# Patient Record
Sex: Female | Born: 1975 | Race: White | Hispanic: No | Smoking: Never smoker
Health system: Southern US, Community
[De-identification: ages and names within clinical notes are randomized; demographics above are authoritative.]

## PROBLEM LIST (undated history)

## (undated) DIAGNOSIS — G43109 Migraine with aura, not intractable, without status migrainosus: Secondary | ICD-10-CM

## (undated) DIAGNOSIS — R519 Headache, unspecified: Secondary | ICD-10-CM

## (undated) DIAGNOSIS — F329 Major depressive disorder, single episode, unspecified: Secondary | ICD-10-CM

## (undated) DIAGNOSIS — R51 Headache: Secondary | ICD-10-CM

## (undated) DIAGNOSIS — F32A Depression, unspecified: Secondary | ICD-10-CM

## (undated) DIAGNOSIS — I10 Essential (primary) hypertension: Secondary | ICD-10-CM

## (undated) HISTORY — DX: Headache, unspecified: R51.9

## (undated) HISTORY — DX: Migraine with aura, not intractable, without status migrainosus: G43.109

## (undated) HISTORY — DX: Essential (primary) hypertension: I10

## (undated) HISTORY — DX: Depression, unspecified: F32.A

## (undated) HISTORY — DX: Major depressive disorder, single episode, unspecified: F32.9

## (undated) HISTORY — DX: Headache: R51

---
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
## 2009-07-06 HISTORY — PX: TONSILLECTOMY AND ADENOIDECTOMY: SHX28

## 2012-11-21 DIAGNOSIS — F419 Anxiety disorder, unspecified: Secondary | ICD-10-CM | POA: Insufficient documentation

## 2014-05-02 ENCOUNTER — Encounter (HOSPITAL_COMMUNITY): Payer: Self-pay | Admitting: Emergency Medicine

## 2014-05-02 ENCOUNTER — Emergency Department (HOSPITAL_COMMUNITY)
Admission: EM | Admit: 2014-05-02 | Discharge: 2014-05-02 | Disposition: A | Discharge status: Home / Self Care | Payer: 59 | Source: Home / Self Care | Attending: Family Medicine | Admitting: Family Medicine

## 2014-05-02 DIAGNOSIS — J069 Acute upper respiratory infection, unspecified: Secondary | ICD-10-CM

## 2014-05-02 MED ORDER — MUCINEX DM 30-600 MG PO TB12: 1.0000 | ORAL_TABLET | Freq: Two times a day (BID) | ORAL | Status: DC

## 2014-05-02 MED ORDER — IPRATROPIUM BROMIDE 0.06 % NA SOLN: 2.0000 | Freq: Four times a day (QID) | NASAL | Status: DC

## 2014-05-02 MED ORDER — MINOCYCLINE HCL 100 MG PO CAPS: 100.0000 mg | ORAL_CAPSULE | Freq: Two times a day (BID) | ORAL | Status: DC

## 2014-05-02 NOTE — ED Notes (Signed)
C/o productive cough onset 3 days and right ear pain Denies f/v/n/d, SOB, wheezing Alert, no signs of acute distress.

## 2014-05-02 NOTE — ED Provider Notes (Signed)
CSN: [id number]     Arrival date & time 05/02/14  1732 History   First MD Initiated Contact with Patient 05/02/14 1810     Chief Complaint  Patient presents with  . Cough   (Consider location/radiation/quality/duration/timing/severity/associated sxs/prior Treatment) Patient is a 38 y.o. female presenting with cough. The history is provided by the patient.  Cough Cough characteristics:  Productive Sputum characteristics:  Clear Severity:  Mild Onset quality:  Gradual Progression:  Unchanged Chronicity:  New Smoker: no   Associated symptoms: ear pain, rhinorrhea and sinus congestion   Associated symptoms: no fever     History reviewed. No pertinent past medical history. History reviewed. No pertinent past surgical history. No family history on file. History  Substance Use Topics  . Smoking status: Never Smoker   . Smokeless tobacco: Not on file  . Alcohol Use: Yes   OB History   Grav Para Term Preterm Abortions TAB SAB Ect Mult Living                 Review of Systems  Constitutional: Negative.  Negative for fever.  HENT: Positive for congestion, ear pain, postnasal drip and rhinorrhea. Negative for ear discharge.   Respiratory: Positive for cough.     Allergies  Review of patient's allergies indicates no known allergies.  Home Medications   Prior to Admission medications   Medication Sig Start Date End Date Taking? Authorizing Provider  Dextromethorphan-Guaifenesin [facility](MUCINEX DM) 30-600 MG TB12 Take 1 tablet by mouth 2 times daily at 12 noon and 4 pm. 05/02/14   Linna HoffJames D Kienna Moncada, MD  ipratropium (ATROVENT) 0.06 % nasal spray Place 2 sprays into both nostrils 4 (four) times daily. 05/02/14   Linna HoffJames D Clarke Amburn, MD  minocycline (MINOCIN,DYNACIN) 100 MG capsule Take 1 capsule (100 mg total) by mouth 2 (two) times daily. 05/02/14   Linna HoffJames D Carisa Backhaus, MD   BP 158/104  Pulse 91  Temp(Src) 98.6 F (37 C) (Oral)  Resp 20  SpO2 100%  LMP 04/19/2014 Physical Exam  Nursing note and  vitals reviewed. Constitutional: She is oriented to person, place, and time. She appears well-developed and well-nourished.  HENT:  Right Ear: External ear normal.  Left Ear: External ear normal.  Nose: Nose normal.  Mouth/Throat: Oropharynx is clear and moist.  Eyes: Pupils are equal, round, and reactive to light.  Neck: Normal range of motion. Neck supple.  Cardiovascular: Normal heart sounds.   Pulmonary/Chest: Effort normal and breath sounds normal.  Lymphadenopathy:    She has no cervical adenopathy.  Neurological: She is alert and oriented to person, place, and time.  Skin: Skin is warm and dry.    ED Course  Procedures (including critical care time) Labs Review Labs Reviewed - No data to display  Imaging Review No results found.   MDM   1. URI (upper respiratory infection)       Linna HoffJames D Shalinda Burkholder, MD 05/02/14 [phone]

## 2014-05-02 NOTE — Discharge Instructions (Signed)
Drink plenty of fluids as discussed, use medicine as prescribed, and mucinex or delsym for cough. Return or see your doctor if further problems °

## 2014-05-10 ENCOUNTER — Encounter: Payer: Self-pay | Admitting: Family Medicine

## 2014-05-10 ENCOUNTER — Ambulatory Visit (INDEPENDENT_AMBULATORY_CARE_PROVIDER_SITE_OTHER): Payer: 59 | Admitting: Family Medicine

## 2014-05-10 VITALS — BP 128/74 | HR 91 | Temp 98.4°F | Ht 66.0 in | Wt 318.5 lb

## 2014-05-10 DIAGNOSIS — R05 Cough: Secondary | ICD-10-CM

## 2014-05-10 DIAGNOSIS — Z309 Encounter for contraceptive management, unspecified: Secondary | ICD-10-CM | POA: Insufficient documentation

## 2014-05-10 DIAGNOSIS — R059 Cough, unspecified: Secondary | ICD-10-CM | POA: Insufficient documentation

## 2014-05-10 DIAGNOSIS — F411 Generalized anxiety disorder: Secondary | ICD-10-CM | POA: Insufficient documentation

## 2014-05-10 DIAGNOSIS — Z13220 Encounter for screening for lipoid disorders: Secondary | ICD-10-CM

## 2014-05-10 DIAGNOSIS — Z30011 Encounter for initial prescription of contraceptive pills: Secondary | ICD-10-CM

## 2014-05-10 DIAGNOSIS — N926 Irregular menstruation, unspecified: Secondary | ICD-10-CM | POA: Insufficient documentation

## 2014-05-10 LAB — TSH: TSH: 1.47 u[IU]/mL (ref 0.35–4.50)

## 2014-05-10 LAB — CBC WITH DIFFERENTIAL/PLATELET
BASOS ABS: 0 10*3/uL (ref 0.0–0.1)
Basophils Relative: 0.3 % (ref 0.0–3.0)
Eosinophils Absolute: 0.3 10*3/uL (ref 0.0–0.7)
Eosinophils Relative: 2.4 % (ref 0.0–5.0)
HEMATOCRIT: 39.4 % (ref 36.0–46.0)
Hemoglobin: 12.9 g/dL (ref 12.0–15.0)
LYMPHS ABS: 2.8 10*3/uL (ref 0.7–4.0)
Lymphocytes Relative: 25.3 % (ref 12.0–46.0)
MCHC: 32.9 g/dL (ref 30.0–36.0)
MCV: 86.7 fl (ref 78.0–100.0)
MONO ABS: 0.8 10*3/uL (ref 0.1–1.0)
Monocytes Relative: 7.3 % (ref 3.0–12.0)
Neutro Abs: 7.2 10*3/uL (ref 1.4–7.7)
Neutrophils Relative %: 64.7 % (ref 43.0–77.0)
PLATELETS: 361 10*3/uL (ref 150.0–400.0)
RBC: 4.54 Mil/uL (ref 3.87–5.11)
RDW: 14 % (ref 11.5–15.5)
WBC: 11.1 10*3/uL — AB (ref 4.0–10.5)

## 2014-05-10 LAB — COMPREHENSIVE METABOLIC PANEL
ALK PHOS: 73 U/L (ref 39–117)
ALT: 40 U/L — AB (ref 0–35)
AST: 24 U/L (ref 0–37)
Albumin: 3.4 g/dL — ABNORMAL LOW (ref 3.5–5.2)
BILIRUBIN TOTAL: 0.3 mg/dL (ref 0.2–1.2)
BUN: 13 mg/dL (ref 6–23)
CO2: 26 mEq/L (ref 19–32)
Calcium: 9.5 mg/dL (ref 8.4–10.5)
Chloride: 105 mEq/L (ref 96–112)
Creatinine, Ser: 0.9 mg/dL (ref 0.4–1.2)
GFR: 80.32 mL/min (ref >60.00)
Glucose, Bld: 91 mg/dL (ref 70–99)
Potassium: 4.1 mEq/L (ref 3.5–5.1)
Sodium: 139 mEq/L (ref 135–145)
Total Protein: 7.1 g/dL (ref 6.0–8.3)

## 2014-05-10 LAB — LIPID PANEL
CHOLESTEROL: 168 mg/dL (ref 0–200)
HDL: 40.8 mg/dL (ref >39.00)
LDL CALC: 96 mg/dL (ref 0–99)
NONHDL: 127.2
Total CHOL/HDL Ratio: 4
Triglycerides: 154 mg/dL — ABNORMAL HIGH (ref 0.0–149.0)
VLDL: 30.8 mg/dL (ref 0.0–40.0)

## 2014-05-10 MED ORDER — SERTRALINE HCL 25 MG PO TABS: 25.0000 mg | ORAL_TABLET | Freq: Every day | ORAL | Status: DC

## 2014-05-10 MED ORDER — LEVONORGEST-ETH ESTRAD 91-DAY 0.15-0.03 &0.01 MG PO TABS: 1.0000 | ORAL_TABLET | Freq: Every day | ORAL | Status: DC

## 2014-05-10 MED ORDER — BENZONATATE 100 MG PO CAPS: 100.0000 mg | ORAL_CAPSULE | Freq: Two times a day (BID) | ORAL | Status: DC | PRN

## 2014-05-10 NOTE — Assessment & Plan Note (Signed)
Persistent but exam reassuring. Likely lingering post viral cough. eRx sent for tessalon perles. Call or return to clinic prn if these symptoms worsen or fail to improve as anticipated. The patient indicates understanding of these issues and agrees with the plan.

## 2014-05-10 NOTE — Assessment & Plan Note (Signed)
Education given regarding options for contraception, including oral contraceptives. eRx sent in for seasonique.

## 2014-05-10 NOTE — Progress Notes (Signed)
Subjective:   Patient ID: Shannon Clayton, female    DOB: 05/15/1976, 38 y.o.   MRN: [id number]  Shannon Clayton is a pleasant 38 y.o. year old female who presents to clinic today with Establish Care; Headache; and Anxiety  on 05/10/2014  HPI: [id number]- has a 36 year old daughter.  H/o anxiety- previously took Paxil, thought it was not that helpful. Has noticed more difficulty sleeping lately, increased anxiety.  Denies feeling depressed. No panic attacks, no SI or HI.  Periods have been irregular- every 2-3 weeks, wants to restart Seasonique that previous OB had her on.  Per pt, last pap smear was last year.  No h/o abnormal pap smears.  Cough-just finished doxycyline for bronchitis.  Feels better but "tickle in her throat" and dry cough have persisted. No fevers, chills or SOB.  No CP.  HA- intermittent HA, none today.  Does have h/o migraine with aura but has not had a migraine in a long time. HA that she does get frequently are usually bilateral, temporal or frontal.  Not associated with n/v, photophobia.  Current Outpatient Prescriptions on File Prior to Visit  Medication Sig Dispense Refill  . Dextromethorphan-Guaifenesin (MUCINEX DM) 30-600 MG TB12 Take 1 tablet by mouth 2 times daily at 12 noon and 4 pm. 28 each 0  . ipratropium (ATROVENT) 0.06 % nasal spray Place 2 sprays into both nostrils 4 (four) times daily. 15 mL 1   No current facility-administered medications on file prior to visit.    Allergies  Allergen Reactions  . Imitrex [Sumatriptan]     myalgias    Past Medical History  Diagnosis Date  . Migraine headache with aura   . Depression   . Frequent headaches   . Hypertension     Past Surgical History  Procedure Laterality Date  . Tonsillectomy and adenoidectomy  2011  . Cesarean section  2006  . Ear pinning  1984    Family History  Problem Relation Age of Onset  . Cancer Mother   . Arthritis Mother   . Hyperlipidemia Mother   . Hypertension Mother   .  Arthritis Father   . Cancer Maternal Grandmother   . Hypertension Maternal Grandmother   . Hypertension Maternal Grandfather     History   Social History  . Marital Status: Legally Separated    Spouse Name: N/A    Number of Children: N/A  . Years of Education: N/A   Occupational History  . Not on file.   Social History Main Topics  . Smoking status: Never Smoker   . Smokeless tobacco: Never Used  . Alcohol Use: Yes  . Drug Use: No  . Sexual Activity: Yes   Other Topics Concern  . Not on file   Social History Narrative   The PMH, PSH, Social History, Family History, Medications, and allergies have been reviewed in [facility], and have been updated if relevant.   Review of Systems  Constitutional: Negative.   HENT: Negative.   Eyes: Negative.   Respiratory: Positive for cough. Negative for apnea, chest tightness, shortness of breath, wheezing and stridor.   Cardiovascular: Negative.   Gastrointestinal: Negative.   Endocrine: Negative.   Genitourinary: Negative.   Musculoskeletal: Negative.   Allergic/Immunologic: Negative.   Neurological: Negative.   Hematological: Negative.   Psychiatric/Behavioral: The patient is nervous/anxious.   All other systems reviewed and are negative.      Objective:    BP 128/74 mmHg  Pulse 91  Temp(Src) 98.4 F (  36.9 C) (Oral)  Ht 5\' 6"  (1.676 m)  Wt 318 lb 8 oz (144.471 kg)  BMI 51.43 kg/m2  SpO2 98%  LMP 04/19/2014   Physical Exam  Constitutional: She is oriented to person, place, and time. She appears well-developed and well-nourished. No distress.  HENT:  Head: Normocephalic and atraumatic.  Neck: Normal range of motion. Neck supple.  Cardiovascular: Normal rate, regular rhythm and normal heart sounds.   Pulmonary/Chest: Effort normal and breath sounds normal.  Abdominal: Soft. Bowel sounds are normal.  Musculoskeletal: Normal range of motion.  Neurological: She is alert and oriented to person, place, and time.  Skin:  Skin is warm and dry.  Psychiatric: She has a normal mood and affect. Her behavior is normal. Judgment and thought content normal.  Nursing note and vitals reviewed.         Assessment & Plan:   Encounter for initial prescription of contraceptive pills  Generalized anxiety disorder - Plan: CBC with Differential, TSH  Screening, lipid - Plan: Comprehensive metabolic panel, Lipid panel  Cough No Follow-up on file.

## 2014-05-10 NOTE — Assessment & Plan Note (Signed)
Discussed tx options. Psychotherapy deferred. eRx sent for zoloft 25 mg daily. Follow up in 3 weeks. The patient indicates understanding of these issues and agrees with the plan.

## 2014-05-10 NOTE — Progress Notes (Signed)
Pre visit review using our clinic review tool, if applicable. No additional management support is needed unless otherwise documented below in the visit note. 

## 2014-05-10 NOTE — Patient Instructions (Addendum)
Great to meet you. We will call you with your lab results.  Call me with an update in 3 weeks with an update.

## 2014-05-14 ENCOUNTER — Encounter: Payer: Self-pay | Admitting: *Deleted

## 2014-05-21 ENCOUNTER — Encounter: Payer: Self-pay | Admitting: Family Medicine

## 2014-05-25 ENCOUNTER — Other Ambulatory Visit: Payer: Self-pay

## 2014-05-25 MED ORDER — BENZONATATE 100 MG PO CAPS: 100.0000 mg | ORAL_CAPSULE | Freq: Two times a day (BID) | ORAL | Status: DC | PRN

## 2014-05-25 NOTE — Telephone Encounter (Signed)
Pt left v/m;pt still has cough and pt request refill of [facility]essalon to [facility].Please advise.

## 2014-05-25 NOTE — Telephone Encounter (Signed)
Juli notified refill has been sent to her pharmacy.

## 2014-05-27 ENCOUNTER — Ambulatory Visit: Payer: Self-pay | Admitting: Physician Assistant

## 2014-07-18 ENCOUNTER — Ambulatory Visit: Payer: Self-pay | Admitting: Specialist

## 2014-07-18 ENCOUNTER — Ambulatory Visit: Payer: Self-pay | Admitting: Family Medicine

## 2014-07-23 ENCOUNTER — Other Ambulatory Visit: Payer: Self-pay | Admitting: Family Medicine

## 2014-10-29 ENCOUNTER — Other Ambulatory Visit: Payer: Self-pay | Admitting: Family Medicine

## 2014-11-28 ENCOUNTER — Ambulatory Visit (INDEPENDENT_AMBULATORY_CARE_PROVIDER_SITE_OTHER): Payer: Self-pay | Admitting: Family Medicine

## 2014-11-28 ENCOUNTER — Encounter: Payer: Self-pay | Admitting: Family Medicine

## 2014-11-28 VITALS — BP 126/80 | HR 98 | Temp 98.3°F | Ht 66.0 in | Wt 318.5 lb

## 2014-11-28 DIAGNOSIS — E65 Localized adiposity: Secondary | ICD-10-CM

## 2014-11-28 NOTE — Progress Notes (Signed)
Pre visit review using our clinic review tool, if applicable. No additional management support is needed unless otherwise documented below in the visit note. 

## 2014-11-28 NOTE — Patient Instructions (Signed)
Tuli's Heel Cups - Heavy duty

## 2014-11-28 NOTE — Progress Notes (Signed)
Dr. Karleen HampshireSpencer T. Mclain Freer, MD, CAQ Sports Medicine [facility] [address] [place], [address] Phone: [phone] Fax: [phone]  11/28/2014  Patient: Shannon Clayton, MRN: [id number], DOB: 04/18/1976, 39 y.o.  Primary Physician:  Ruthe Mannanalia Aron, MD  Chief Complaint: Foot Pain  Subjective:   Shannon Clayton is a 39 y.o. very pleasant female patient who presents with the following:  Ongoing foot pain for about 2 months. The patient has a BMI 51. She has pain laterally on the plantar aspect of her foot in the posterio-lateral aspect. She did not have any specific trauma or injury. This is not near the plantar insertion. She is having no pain in the posterior of her foot, no pain in the tibia, fibula, or the midfoot or forefoot. She has had some tingling occasionally medially on the opposite foot.  Posterior heel will hurt for a few months. 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     Past Medical History, Surgical History, Social History, Family History, Problem List, Medications, and Allergies have been reviewed and updated if relevant.  GEN: No fevers, chills. Nontoxic. Primarily MSK c/o today. MSK: Detailed in the HPI GI: tolerating PO intake without difficulty Neuro: No numbness, parasthesias, or tingling associated. Otherwise the pertinent positives of the ROS are noted above.   Objective:   BP 126/80 mmHg  Pulse 98  Temp(Src) 98.3 F (36.8 C) (Oral)  Ht 5\' 6"  (1.676 m)  Wt 318 lb 8 oz (144.471 kg)  BMI 51.43 kg/m2  LMP 10/29/2014 (Approximate)   GEN: WDWN, NAD, Non-toxic, Alert & Oriented x 3 HEENT: Atraumatic, Normocephalic.  Ears and Nose: No external deformity. EXTR: No clubbing/cyanosis/edema NEURO: Normal gait.  PSYCH: Normally interactive. Conversant. Not depressed or anxious appearing.  Calm demeanor.   FEET: L Echymosis: no Edema: no ROM: full LE B Gait: heel toe, non-antalgic MT pain: no Callus pattern:  none Lateral Mall: NT Medial Mall: NT Talus: NT Navicular: NT Cuboid: NT Calcaneous: Laterally on the plantar aspect, the patient has some mild tenderness to palpation. Squeeze test is negative Metatarsals: NT 5th MT: NT Phalanges: NT Achilles: NT Plantar Fascia: NT Fat Pad: NT Peroneals: NT Post Tib: NT Great Toe: Nml motion Ant Drawer: neg ATFL: NT CFL: NT Deltoid: NT Other foot breakdown: none Long arch: preserved Transverse arch: preserved Hindfoot breakdown: none Sensation: intact   Radiology: No results found.  Assessment and Plan:   Fat pad syndrome  I can find nothing worrisome on examination. The patient's plantar fascia is nontender. Achilles is nontender, all bony anatomy is nontender. Most suspicious for fat pad injury.  Currently, she is wearing flip-flops in the office. Recommended board supportive shoes such as walking or running shoes, and I strongly recommended that she get a heel cup to provide some compression as well as conditioning. Aside from this, I would only give this time.  Increased BMI would be a risk factor as well.  Signed,  Elpidio GaleaSpencer T. Consepcion Utt, MD   Patient's Medications  New Prescriptions   No medications on file  Previous Medications   LEVONORGESTREL-ETHINYL ESTRADIOL (AMETHIA,CAMRESE) 0.15-0.03 &0.01 MG TABLET    Take 1 tablet by mouth daily.   SERTRALINE (ZOLOFT) 25 MG TABLET    TAKE 1 TABLET BY MOUTH AT BEDTIME.  Modified Medications   No medications on file  Discontinued Medications   BENZONATATE (TESSALON) 100 MG CAPSULE    Take 1 capsule (100 mg total) by mouth 2 (  two) times daily as needed for cough.   DEXTROMETHORPHAN-GUAIFENESIN (MUCINEX DM) 30-600 MG TB12    Take 1 tablet by mouth 2 times daily at 12 noon and 4 pm.   IPRATROPIUM (ATROVENT) 0.06 % NASAL SPRAY    Place 2 sprays into both nostrils 4 (four) times daily.

## 2015-01-17 ENCOUNTER — Telehealth: Payer: Self-pay | Admitting: Family Medicine
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
## 2015-01-17 NOTE — Telephone Encounter (Signed)
Noted. Agree with dispo. 

## 2015-05-21 ENCOUNTER — Telehealth: Payer: Self-pay

## 2015-05-21 MED ORDER — PNV PRENATAL PLUS MULTIVITAMIN 27-1 MG PO TABS: 1.0000 | ORAL_TABLET | Freq: Every day | ORAL | Status: DC

## 2015-05-21 NOTE — Telephone Encounter (Signed)
Spoke to pt who states that there is no specific brand, but states she "was on them a while back."

## 2015-05-21 NOTE — Telephone Encounter (Signed)
Is there a specific one she is requesting?  They are all OTC.

## 2015-05-21 NOTE — Telephone Encounter (Signed)
eRx sent

## 2015-05-21 NOTE — Telephone Encounter (Signed)
Pt left v/m requesting new rx for prenatal vitamins to [facility]; pt established care 05/10/14; no future appt scheduled.Please advise.

## 2015-07-10 ENCOUNTER — Other Ambulatory Visit: Payer: Self-pay | Admitting: *Deleted

## 2015-07-10 MED ORDER — SERTRALINE HCL 25 MG PO TABS: 25.0000 mg | ORAL_TABLET | Freq: Every day | ORAL | Status: DC

## 2015-07-10 NOTE — Telephone Encounter (Signed)
Spoke to pt and informed her OV required; last seen 05/2014. appt scheduled and pt given #30 and advised if appt cancelled, additional refills cannot be granted until seen

## 2015-07-22 ENCOUNTER — Ambulatory Visit (INDEPENDENT_AMBULATORY_CARE_PROVIDER_SITE_OTHER): Payer: Managed Care, Other (non HMO) | Admitting: Family Medicine

## 2015-07-22 ENCOUNTER — Encounter: Payer: Self-pay | Admitting: Family Medicine

## 2015-07-22 VITALS — BP 132/74 | HR 98 | Temp 98.0°F | Wt 328.2 lb

## 2015-07-22 DIAGNOSIS — F411 Generalized anxiety disorder: Secondary | ICD-10-CM

## 2015-07-22 MED ORDER — SERTRALINE HCL 50 MG PO TABS: 50.0000 mg | ORAL_TABLET | Freq: Every day | ORAL | Status: DC

## 2015-07-22 NOTE — Progress Notes (Signed)
Pre visit review using our clinic review tool, if applicable. No additional management support is needed unless otherwise documented below in the visit note. 

## 2015-07-22 NOTE — Patient Instructions (Signed)
Great to see you. I am so sorry for your loss.  We are increasing zoloft to 50 mg daily.  Please keep me posted.

## 2015-07-22 NOTE — Progress Notes (Signed)
   Subjective:   Patient ID: Shannon Clayton, female    DOB: 05/11/1976, 40 y.o.   MRN: [id number]  Shannon SpenceJennifer Evans Gulotta is a pleasant 40 y.o. year old female who presents to clinic today with Follow-up  on 07/22/2015  HPI:  Anxiety- Had been doing well on zoloft 25 mg daily until past few months.  Under more stress- working from her.  Her step dad passed away.  Feels more anxious lately.  Denies feeling depressed.  Sleeping ok.  Appetite good.  No SI or HI.  Current Outpatient Prescriptions on File Prior to Visit  Medication Sig Dispense Refill  . Prenatal Vit-Fe Fumarate-FA (PNV PRENATAL PLUS MULTIVITAMIN) 27-1 MG TABS Take 1 tablet by mouth daily. 30 tablet 6   No current facility-administered medications on file prior to visit.    Allergies  Allergen Reactions  . Imitrex [Sumatriptan]     myalgias    Past Medical History  Diagnosis Date  . Migraine headache with aura   . Depression   . Frequent headaches   . Hypertension     Past Surgical History  Procedure Laterality Date  . Tonsillectomy and adenoidectomy  2011  . Cesarean section  2006  . Ear pinning  1984    Family History  Problem Relation Age of Onset  . Cancer Mother   . Arthritis Mother   . Hyperlipidemia Mother   . Hypertension Mother   . Arthritis Father   . Cancer Maternal Grandmother   . Hypertension Maternal Grandmother   . Hypertension Maternal Grandfather     Social History   Social History  . Marital Status: Legally Separated    Spouse Name: N/A  . Number of Children: N/A  . Years of Education: N/A   Occupational History  . Not on file.   Social History Main Topics  . Smoking status: Never Smoker   . Smokeless tobacco: Never Used  . Alcohol Use: Yes  . Drug Use: No  . Sexual Activity: Yes   Other Topics Concern  . Not on file   Social History Narrative   The PMH, PSH, Social History, Family History, Medications, and allergies have been reviewed in [facility], and have been  updated if relevant.   Review of Systems  Constitutional: Negative.   Psychiatric/Behavioral: Negative for suicidal ideas, hallucinations, behavioral problems, confusion, sleep disturbance, self-injury, dysphoric mood, decreased concentration and agitation. The patient is nervous/anxious. The patient is not hyperactive.   All other systems reviewed and are negative.      Objective:    BP 132/74 mmHg  Pulse 98  Temp(Src) 98 F (36.7 C) (Oral)  Wt 328 lb 4 oz (148.893 kg)  SpO2 98%  LMP 06/25/2015   Physical Exam  Constitutional: She is oriented to person, place, and time. She appears well-developed and well-nourished. No distress.  HENT:  Head: Normocephalic.  Eyes: Conjunctivae are normal.  Neck: Normal range of motion.  Cardiovascular: Normal rate.   Pulmonary/Chest: Effort normal.  Musculoskeletal: Normal range of motion.  Neurological: She is alert and oriented to person, place, and time. No cranial nerve deficit.  Skin: She is not diaphoretic.  Psychiatric: She has a normal mood and affect. Her behavior is normal. Judgment and thought content normal.  Nursing note and vitals reviewed.         Assessment & Plan:   Generalized anxiety disorder No Follow-up on file.

## 2015-07-22 NOTE — Assessment & Plan Note (Signed)
Deteriorated. >25 minutes spent in face to face time with patient, >50% spent in counselling or coordination of care Increase Zoloft to 50 mg daily. Call or return to clinic prn if these symptoms worsen or fail to improve as anticipated. The patient indicates understanding of these issues and agrees with the plan.

## 2015-08-09 ENCOUNTER — Other Ambulatory Visit: Payer: Self-pay | Admitting: *Deleted

## 2015-08-09 NOTE — Telephone Encounter (Signed)
[facility] left [facility]ic[facility] at T[facility], they said this is the 3rd request for refill of zoloft, left voicemail letting pharmacy know Rx was sent electronically on 07/22/15 #30 with 3 additional refills and they should have it on file

## 2017-01-25 ENCOUNTER — Other Ambulatory Visit: Payer: Self-pay | Admitting: Family Medicine

## 2017-01-25 MED ORDER — SERTRALINE HCL 50 MG PO TABS: 50.0000 mg | ORAL_TABLET | Freq: Every day | ORAL | 3 refills | Status: DC

## 2017-02-10 ENCOUNTER — Other Ambulatory Visit: Payer: Self-pay | Admitting: Family Medicine

## 2017-02-10 MED ORDER — SERTRALINE HCL 50 MG PO TABS: 50.0000 mg | ORAL_TABLET | Freq: Every day | ORAL | 3 refills | Status: DC

## 2019-05-26 ENCOUNTER — Encounter: Payer: Self-pay | Admitting: Internal Medicine

## 2019-05-26 ENCOUNTER — Ambulatory Visit (INDEPENDENT_AMBULATORY_CARE_PROVIDER_SITE_OTHER): Payer: 59 | Admitting: Internal Medicine

## 2019-05-26 ENCOUNTER — Other Ambulatory Visit: Payer: Self-pay

## 2019-05-26 VITALS — BP 150/90 | HR 92 | Temp 97.9°F | Ht 66.0 in | Wt 342.8 lb

## 2019-05-26 DIAGNOSIS — G43109 Migraine with aura, not intractable, without status migrainosus: Secondary | ICD-10-CM | POA: Diagnosis not present

## 2019-05-26 DIAGNOSIS — F419 Anxiety disorder, unspecified: Secondary | ICD-10-CM

## 2019-05-26 DIAGNOSIS — Z1231 Encounter for screening mammogram for malignant neoplasm of breast: Secondary | ICD-10-CM

## 2019-05-26 DIAGNOSIS — E559 Vitamin D deficiency, unspecified: Secondary | ICD-10-CM

## 2019-05-26 DIAGNOSIS — I10 Essential (primary) hypertension: Secondary | ICD-10-CM

## 2019-05-26 DIAGNOSIS — E611 Iron deficiency: Secondary | ICD-10-CM

## 2019-05-26 DIAGNOSIS — Z1329 Encounter for screening for other suspected endocrine disorder: Secondary | ICD-10-CM

## 2019-05-26 DIAGNOSIS — S90222S Contusion of left lesser toe(s) with damage to nail, sequela: Secondary | ICD-10-CM

## 2019-05-26 DIAGNOSIS — Z23 Encounter for immunization: Secondary | ICD-10-CM | POA: Diagnosis not present

## 2019-05-26 DIAGNOSIS — E538 Deficiency of other specified B group vitamins: Secondary | ICD-10-CM

## 2019-05-26 DIAGNOSIS — R739 Hyperglycemia, unspecified: Secondary | ICD-10-CM

## 2019-05-26 DIAGNOSIS — F329 Major depressive disorder, single episode, unspecified: Secondary | ICD-10-CM

## 2019-05-26 DIAGNOSIS — S61409A Unspecified open wound of unspecified hand, initial encounter: Secondary | ICD-10-CM

## 2019-05-26 DIAGNOSIS — Z124 Encounter for screening for malignant neoplasm of cervix: Secondary | ICD-10-CM

## 2019-05-26 DIAGNOSIS — Z1389 Encounter for screening for other disorder: Secondary | ICD-10-CM

## 2019-05-26 MED ORDER — VENLAFAXINE HCL ER 37.5 MG PO CP24: 37.5000 mg | ORAL_CAPSULE | Freq: Every day | ORAL | 0 refills | Status: DC

## 2019-05-26 MED ORDER — MUPIROCIN 2 % EX OINT: 1.0000 "application " | TOPICAL_OINTMENT | Freq: Two times a day (BID) | CUTANEOUS | 2 refills | Status: DC

## 2019-05-26 MED ORDER — BUTALBITAL-APAP-CAFFEINE 50-325-40 MG PO TABS: 1.0000 | ORAL_TABLET | Freq: Every day | ORAL | 0 refills | Status: DC | PRN

## 2019-05-26 MED ORDER — AMLODIPINE BESYLATE 2.5 MG PO TABS: 2.5000 mg | ORAL_TABLET | Freq: Every day | ORAL | 3 refills | Status: DC

## 2019-05-26 NOTE — Patient Instructions (Addendum)
The next 56 days  Magnesium 250 to 400 or 500 mg daily    Healthy Eating Following a healthy eating pattern may help you to achieve and maintain a healthy body weight, reduce the risk of chronic disease, and live a long and productive life. It is important to follow a healthy eating pattern at an appropriate calorie level for your body. Your nutritional needs should be met primarily through food by choosing a variety of nutrient-rich foods. What are tips for following this plan? Reading food labels  Read labels and choose the following: ? Reduced or low sodium. ? Juices with 100% fruit juice. ? Foods with low saturated fats and high polyunsaturated and monounsaturated fats. ? Foods with whole grains, such as whole wheat, cracked wheat, brown rice, and wild rice. ? Whole grains that are fortified with folic acid. This is recommended for women who are pregnant or who want to become pregnant.  Read labels and avoid the following: ? Foods with a lot of added sugars. These include foods that contain brown sugar, corn sweetener, corn syrup, dextrose, fructose, glucose, high-fructose corn syrup, honey, invert sugar, lactose, malt syrup, maltose, molasses, raw sugar, sucrose, trehalose, or turbinado sugar.  Do not eat more than the following amounts of added sugar per day:  6 teaspoons (25 g) for women.  9 teaspoons (38 g) for men. ? Foods that contain processed or refined starches and grains. ? Refined grain products, such as white flour, degermed cornmeal, white bread, and white rice. Shopping  Choose nutrient-rich snacks, such as vegetables, whole fruits, and nuts. Avoid high-calorie and high-sugar snacks, such as potato chips, fruit snacks, and candy.  Use oil-based dressings and spreads on foods instead of solid fats such as butter, stick margarine, or cream cheese.  Limit pre-made sauces, mixes, and "instant" products such as flavored rice, instant noodles, and ready-made pasta.  Try  more plant-protein sources, such as tofu, tempeh, black beans, edamame, lentils, nuts, and seeds.  Explore eating plans such as the Mediterranean diet or vegetarian diet. Cooking  Use oil to saut or stir-fry foods instead of solid fats such as butter, stick margarine, or lard.  Try baking, boiling, grilling, or broiling instead of frying.  Remove the fatty part of meats before cooking.  Steam vegetables in water or broth. Meal planning   At meals, imagine dividing your plate into fourths: ? One-half of your plate is fruits and vegetables. ? One-fourth of your plate is whole grains. ? One-fourth of your plate is protein, especially lean meats, poultry, eggs, tofu, beans, or nuts.  Include low-fat dairy as part of your daily diet. Lifestyle  Choose healthy options in all settings, including home, work, school, restaurants, or stores.  Prepare your food safely: ? Wash your hands after handling raw meats. ? Keep food preparation surfaces clean by regularly washing with hot, soapy water. ? Keep raw meats separate from ready-to-eat foods, such as fruits and vegetables. ? Cook seafood, meat, poultry, and eggs to the recommended internal temperature. ? Store foods at safe temperatures. In general:  Keep cold foods at 40F (4.4C) or below.  Keep hot foods at 140F (60C) or above.  Keep your freezer at [facility] (-17.8C) or below.  Foods are no longer safe to eat when they have been between the temperatures of 40-140F (4.4-60C) for more than 2 hours. What foods should I eat? Fruits Aim to eat 2 cup-equivalents of fresh, canned (in natural juice), or frozen fruits each day. Examples of 1  cup-equivalent of fruit include 1 small apple, 8 large strawberries, 1 cup canned fruit,  cup dried fruit, or 1 cup 100% juice. Vegetables Aim to eat 2-3 cup-equivalents of fresh and frozen vegetables each day, including different varieties and colors. Examples of 1 cup-equivalent of vegetables  include 2 medium carrots, 2 cups raw, leafy greens, 1 cup chopped vegetable (raw or cooked), or 1 medium baked potato. Grains Aim to eat 6 ounce-equivalents of whole grains each day. Examples of 1 ounce-equivalent of grains include 1 slice of bread, 1 cup ready-to-eat cereal, 3 cups popcorn, or  cup cooked rice, pasta, or cereal. Meats and other proteins Aim to eat 5-6 ounce-equivalents of protein each day. Examples of 1 ounce-equivalent of protein include 1 egg, 1/2 cup nuts or seeds, or 1 tablespoon (16 g) peanut butter. A cut of meat or fish that is the size of a deck of cards is about 3-4 ounce-equivalents.  Of the protein you eat each week, try to have at least 8 ounces come from seafood. This includes salmon, trout, herring, and anchovies. Dairy Aim to eat 3 cup-equivalents of fat-free or low-fat dairy each day. Examples of 1 cup-equivalent of dairy include 1 cup (240 mL) milk, 8 ounces (250 g) yogurt, 1 ounces (44 g) natural cheese, or 1 cup (240 mL) fortified soy milk. Fats and oils  Aim for about 5 teaspoons (21 g) per day. Choose monounsaturated fats, such as canola and olive oils, avocados, peanut butter, and most nuts, or polyunsaturated fats, such as sunflower, corn, and soybean oils, walnuts, pine nuts, sesame seeds, sunflower seeds, and flaxseed. Beverages  Aim for six 8-oz glasses of water per day. Limit coffee to three to five 8-oz cups per day.  Limit caffeinated beverages that have added calories, such as soda and energy drinks.  Limit alcohol intake to no more than 1 drink a day for nonpregnant women and 2 drinks a day for men. One drink equals 12 oz of beer (355 mL), 5 oz of wine (148 mL), or 1 oz of hard liquor (44 mL). Seasoning and other foods  Avoid adding excess amounts of salt to your foods. Try flavoring foods with herbs and spices instead of salt.  Avoid adding sugar to foods.  Try using oil-based dressings, sauces, and spreads instead of solid fats. This  information is based on general [place] nutrition guidelines. For more information, visit Build[employer].es. Exact amounts may vary based on your nutrition needs. Summary  A healthy eating plan may help you to maintain a healthy weight, reduce the risk of chronic diseases, and stay active throughout your life.  Plan your meals. Make sure you eat the right portions of a variety of nutrient-rich foods.  Try baking, boiling, grilling, or broiling instead of frying.  Choose healthy options in all settings, including home, work, school, restaurants, or stores. This information is not intended to replace advice given to you by your health care provider. Make sure you discuss any questions you have with your health care provider. Document Released: 10/04/2017 Document Revised: 10/04/2017 Document Reviewed: 10/04/2017 Elsevier Patient Education  [place].  Low-Sodium Eating Plan Sodium, which is an element that makes up salt, helps you maintain a healthy balance of fluids in your body. Too much sodium can increase your blood pressure and cause fluid and waste to be held in your body. Your health care provider or dietitian may recommend following this plan if you have high blood pressure (hypertension), kidney disease, liver disease, or heart  failure. Eating less sodium can help lower your blood pressure, reduce swelling, and protect your heart, liver, and kidneys. What are tips for following this plan? General guidelines  Most people on this plan should limit their sodium intake to 1,500-2,000 mg (milligrams) of sodium each day. Reading food labels   The Nutrition Facts label lists the amount of sodium in one serving of the food. If you eat more than one serving, you must multiply the listed amount of sodium by the number of servings.  Choose foods with less than 140 mg of sodium per serving.  Avoid foods with 300 mg of sodium or more per serving. Shopping  Look for lower-sodium  products, often labeled as "low-sodium" or "no salt added."  Always check the sodium content even if foods are labeled as "unsalted" or "no salt added".  Buy fresh foods. ? Avoid canned foods and premade or frozen meals. ? Avoid canned, cured, or processed meats  Buy breads that have less than 80 mg of sodium per slice. Cooking  Eat more home-cooked food and less restaurant, buffet, and fast food.  Avoid adding salt when cooking. Use salt-free seasonings or herbs instead of table salt or sea salt. Check with your health care provider or pharmacist before using salt substitutes.  Cook with plant-based oils, such as canola, sunflower, or olive oil. Meal planning  When eating at a restaurant, ask that your food be prepared with less salt or no salt, if possible.  Avoid foods that contain MSG (monosodium glutamate). MSG is sometimes added to [place] food, bouillon, and some canned foods. What foods are recommended? The items listed may not be a complete list. Talk with your dietitian about what dietary choices are best for you. Grains Low-sodium cereals, including oats, puffed wheat and rice, and shredded wheat. Low-sodium crackers. Unsalted rice. Unsalted pasta. Low-sodium bread. Whole-grain breads and whole-grain pasta. Vegetables Fresh or frozen vegetables. "No salt added" canned vegetables. "No salt added" tomato sauce and paste. Low-sodium or reduced-sodium tomato and vegetable juice. Fruits Fresh, frozen, or canned fruit. Fruit juice. Meats and other protein foods Fresh or frozen (no salt added) meat, poultry, seafood, and fish. Low-sodium canned tuna and salmon. Unsalted nuts. Dried peas, beans, and lentils without added salt. Unsalted canned beans. Eggs. Unsalted nut butters. Dairy Milk. Soy milk. Cheese that is naturally low in sodium, such as ricotta cheese, fresh mozzarella, or Swiss cheese Low-sodium or reduced-sodium cheese. Cream cheese. Yogurt. Fats and oils Unsalted  butter. Unsalted margarine with no trans fat. Vegetable oils such as canola or olive oils. Seasonings and other foods Fresh and dried herbs and spices. Salt-free seasonings. Low-sodium mustard and ketchup. Sodium-free salad dressing. Sodium-free light mayonnaise. Fresh or refrigerated horseradish. Lemon juice. Vinegar. Homemade, reduced-sodium, or low-sodium soups. Unsalted popcorn and pretzels. Low-salt or salt-free chips. What foods are not recommended? The items listed may not be a complete list. Talk with your dietitian about what dietary choices are best for you. Grains Instant hot cereals. Bread stuffing, pancake, and biscuit mixes. Croutons. Seasoned rice or pasta mixes. Noodle soup cups. Boxed or frozen macaroni and cheese. Regular salted crackers. Self-rising flour. Vegetables Sauerkraut, pickled vegetables, and relishes. Olives. [place] fries. Onion rings. Regular canned vegetables (not low-sodium or reduced-sodium). Regular canned tomato sauce and paste (not low-sodium or reduced-sodium). Regular tomato and vegetable juice (not low-sodium or reduced-sodium). Frozen vegetables in sauces. Meats and other protein foods Meat or fish that is salted, canned, smoked, spiced, or pickled. Bacon, ham, sausage, hotdogs, corned  beef, chipped beef, packaged lunch meats, salt pork, jerky, pickled herring, anchovies, regular canned tuna, sardines, salted nuts. Dairy Processed cheese and cheese spreads. Cheese curds. Blue cheese. Feta cheese. String cheese. Regular cottage cheese. Buttermilk. Canned milk. Fats and oils Salted butter. Regular margarine. Ghee. Bacon fat. Seasonings and other foods Onion salt, garlic salt, seasoned salt, table salt, and sea salt. Canned and packaged gravies. Worcestershire sauce. Tartar sauce. Barbecue sauce. Teriyaki sauce. Soy sauce, including reduced-sodium. Steak sauce. Fish sauce. Oyster sauce. Cocktail sauce. Horseradish that you find on the shelf. Regular ketchup and  mustard. Meat flavorings and tenderizers. Bouillon cubes. Hot sauce and Tabasco sauce. Premade or packaged marinades. Premade or packaged taco seasonings. Relishes. Regular salad dressings. Salsa. Potato and tortilla chips. Corn chips and puffs. Salted popcorn and pretzels. Canned or dried soups. Pizza. Frozen entrees and pot pies. Summary  Eating less sodium can help lower your blood pressure, reduce swelling, and protect your heart, liver, and kidneys.  Most people on this plan should limit their sodium intake to 1,500-2,000 mg (milligrams) of sodium each day.  Canned, boxed, and frozen foods are high in sodium. Restaurant foods, fast foods, and pizza are also very high in sodium. You also get sodium by adding salt to food.  Try to cook at home, eat more fresh fruits and vegetables, and eat less fast food, canned, processed, or prepared foods. This information is not intended to replace advice given to you by your health care provider. Make sure you discuss any questions you have with your health care provider. Document Released: 12/12/2001 Document Revised: 06/04/2017 Document Reviewed: 06/15/2016 Elsevier Patient Education  2020 [place] DASH stands for "Dietary Approaches to Stop Hypertension." The DASH eating plan is a healthy eating plan that has been shown to reduce high blood pressure (hypertension). It may also reduce your risk for type 2 diabetes, heart disease, and stroke. The DASH eating plan may also help with weight loss. What are tips for following this plan?  General guidelines  Avoid eating more than 2,300 mg (milligrams) of salt (sodium) a day. If you have hypertension, you may need to reduce your sodium intake to 1,500 mg a day.  Limit alcohol intake to no more than 1 drink a day for nonpregnant women and 2 drinks a day for men. One drink equals 12 oz of beer, 5 oz of wine, or 1 oz of hard liquor.  Work with your health care provider to maintain a  healthy body weight or to lose weight. Ask what an ideal weight is for you.  Get at least 30 minutes of exercise that causes your heart to beat faster (aerobic exercise) most days of the week. Activities may include walking, swimming, or biking.  Work with your health care provider or diet and nutrition specialist (dietitian) to adjust your eating plan to your individual calorie needs. Reading food labels   Check food labels for the amount of sodium per serving. Choose foods with less than 5 percent of the Daily Value of sodium. Generally, foods with less than 300 mg of sodium per serving fit into this eating plan.  To find whole grains, look for the word "whole" as the first word in the ingredient list. Shopping  Buy products labeled as "low-sodium" or "no salt added."  Buy fresh foods. Avoid canned foods and premade or frozen meals. Cooking  Avoid adding salt when cooking. Use salt-free seasonings or herbs instead of table salt or sea salt. Check  with your health care provider or pharmacist before using salt substitutes.  Do not fry foods. Cook foods using healthy methods such as baking, boiling, grilling, and broiling instead.  Cook with heart-healthy oils, such as olive, canola, soybean, or sunflower oil. Meal planning  Eat a balanced diet that includes: ? 5 or more servings of fruits and vegetables each day. At each meal, try to fill half of your plate with fruits and vegetables. ? Up to 6-8 servings of whole grains each day. ? Less than 6 oz of lean meat, poultry, or fish each day. A 3-oz serving of meat is about the same size as a deck of cards. One egg equals 1 oz. ? 2 servings of low-fat dairy each day. ? A serving of nuts, seeds, or beans 5 times each week. ? Heart-healthy fats. Healthy fats called Omega-3 fatty acids are found in foods such as flaxseeds and coldwater fish, like sardines, salmon, and mackerel.  Limit how much you eat of the following: ? Canned or  prepackaged foods. ? Food that is high in trans fat, such as fried foods. ? Food that is high in saturated fat, such as fatty meat. ? Sweets, desserts, sugary drinks, and other foods with added sugar. ? Full-fat dairy products.  Do not salt foods before eating.  Try to eat at least 2 vegetarian meals each week.  Eat more home-cooked food and less restaurant, buffet, and fast food.  When eating at a restaurant, ask that your food be prepared with less salt or no salt, if possible. What foods are recommended? The items listed may not be a complete list. Talk with your dietitian about what dietary choices are best for you. Grains Whole-grain or whole-wheat bread. Whole-grain or whole-wheat pasta. Brown rice. [place]. Bulgur. Whole-grain and low-sodium cereals. Pita bread. Low-fat, low-sodium crackers. Whole-wheat flour tortillas. Vegetables Fresh or frozen vegetables (raw, steamed, roasted, or grilled). Low-sodium or reduced-sodium tomato and vegetable juice. Low-sodium or reduced-sodium tomato sauce and tomato paste. Low-sodium or reduced-sodium canned vegetables. Fruits All fresh, dried, or frozen fruit. Canned fruit in natural juice (without added sugar). Meat and other protein foods Skinless chicken or [place]. Ground chicken or [place]. Pork with fat trimmed off. Fish and seafood. Egg whites. Dried beans, peas, or lentils. Unsalted nuts, nut butters, and seeds. Unsalted canned beans. Lean cuts of beef with fat trimmed off. Low-sodium, lean deli meat. Dairy Low-fat (1%) or fat-free (skim) milk. Fat-free, low-fat, or reduced-fat cheeses. Nonfat, low-sodium ricotta or cottage cheese. Low-fat or nonfat yogurt. Low-fat, low-sodium cheese. Fats and oils Soft margarine without trans fats. Vegetable oil. Low-fat, reduced-fat, or light mayonnaise and salad dressings (reduced-sodium). Canola, safflower, olive, soybean, and sunflower oils. Avocado. Seasoning and other foods Herbs. Spices.  Seasoning mixes without salt. Unsalted popcorn and pretzels. Fat-free sweets. What foods are not recommended? The items listed may not be a complete list. Talk with your dietitian about what dietary choices are best for you. Grains Baked goods made with fat, such as croissants, muffins, or some breads. Dry pasta or rice meal packs. Vegetables Creamed or fried vegetables. Vegetables in a cheese sauce. Regular canned vegetables (not low-sodium or reduced-sodium). Regular canned tomato sauce and paste (not low-sodium or reduced-sodium). Regular tomato and vegetable juice (not low-sodium or reduced-sodium). Angie Fava. Olives. Fruits Canned fruit in a light or heavy syrup. Fried fruit. Fruit in cream or butter sauce. Meat and other protein foods Fatty cuts of meat. Ribs. Fried meat. Berniece Salines. Sausage. Bologna and other processed lunch  meats. Salami. Fatback. Hotdogs. Bratwurst. Salted nuts and seeds. Canned beans with added salt. Canned or smoked fish. Whole eggs or egg yolks. Chicken or [place] with skin. Dairy Whole or 2% milk, cream, and half-and-half. Whole or full-fat cream cheese. Whole-fat or sweetened yogurt. Full-fat cheese. Nondairy creamers. Whipped toppings. Processed cheese and cheese spreads. Fats and oils Butter. Stick margarine. Lard. Shortening. Ghee. Bacon fat. Tropical oils, such as coconut, palm kernel, or palm oil. Seasoning and other foods Salted popcorn and pretzels. Onion salt, garlic salt, seasoned salt, table salt, and sea salt. Worcestershire sauce. Tartar sauce. Barbecue sauce. Teriyaki sauce. Soy sauce, including reduced-sodium. Steak sauce. Canned and packaged gravies. Fish sauce. Oyster sauce. Cocktail sauce. Horseradish that you find on the shelf. Ketchup. Mustard. Meat flavorings and tenderizers. Bouillon cubes. Hot sauce and Tabasco sauce. Premade or packaged marinades. Premade or packaged taco seasonings. Relishes. Regular salad dressings. Where to find more  information:  National Heart, Lung, and [place]: https://wilson-eaton.com/  American Heart Association: www.heart.org Summary  The DASH eating plan is a healthy eating plan that has been shown to reduce high blood pressure (hypertension). It may also reduce your risk for type 2 diabetes, heart disease, and stroke.  With the DASH eating plan, you should limit salt (sodium) intake to 2,300 mg a day. If you have hypertension, you may need to reduce your sodium intake to 1,500 mg a day.  When on the DASH eating plan, aim to eat more fresh fruits and vegetables, whole grains, lean proteins, low-fat dairy, and heart-healthy fats.  Work with your health care provider or diet and nutrition specialist (dietitian) to adjust your eating plan to your individual calorie needs. This information is not intended to replace advice given to you by your health care provider. Make sure you discuss any questions you have with your health care provider. Document Released: 06/11/2011 Document Revised: 06/04/2017 Document Reviewed: 06/15/2016 Elsevier Patient Education  2020 [employer].  Hypertension, Adult High blood pressure (hypertension) is when the force of blood pumping through the arteries is too strong. The arteries are the blood vessels that carry blood from the heart throughout the body. Hypertension forces the heart to work harder to pump blood and may cause arteries to become narrow or stiff. Untreated or uncontrolled hypertension can cause a heart attack, heart failure, a stroke, kidney disease, and other problems. A blood pressure reading consists of a higher number over a lower number. Ideally, your blood pressure should be below 120/80. The first ("top") number is called the systolic pressure. It is a measure of the pressure in your arteries as your heart beats. The second ("bottom") number is called the diastolic pressure. It is a measure of the pressure in your arteries as the heart relaxes. What  are the causes? The exact cause of this condition is not known. There are some conditions that result in or are related to high blood pressure. What increases the risk? Some risk factors for high blood pressure are under your control. The following factors may make you more likely to develop this condition:  Smoking.  Having type 2 diabetes mellitus, high cholesterol, or both.  Not getting enough exercise or physical activity.  Being overweight.  Having too much fat, sugar, calories, or salt (sodium) in your diet.  Drinking too much alcohol. Some risk factors for high blood pressure may be difficult or impossible to change. Some of these factors include:  Having chronic kidney disease.  Having a family history of high blood  pressure.  Age. Risk increases with age.  Race. You may be at higher risk if you are African American.  Gender. Men are at higher risk than women before age 64. After age 20, women are at higher risk than men.  Having obstructive sleep apnea.  Stress. What are the signs or symptoms? High blood pressure may not cause symptoms. Very high blood pressure (hypertensive crisis) may cause:  Headache.  Anxiety.  Shortness of breath.  Nosebleed.  Nausea and vomiting.  Vision changes.  Severe chest pain.  Seizures. How is this diagnosed? This condition is diagnosed by measuring your blood pressure while you are seated, with your arm resting on a flat surface, your legs uncrossed, and your feet flat on the floor. The cuff of the blood pressure monitor will be placed directly against the skin of your upper arm at the level of your heart. It should be measured at least twice using the same arm. Certain conditions can cause a difference in blood pressure between your right and left arms. Certain factors can cause blood pressure readings to be lower or higher than normal for a short period of time:  When your blood pressure is higher when you are in a health  care provider's office than when you are at home, this is called white coat hypertension. Most people with this condition do not need medicines.  When your blood pressure is higher at home than when you are in a health care provider's office, this is called masked hypertension. Most people with this condition may need medicines to control blood pressure. If you have a high blood pressure reading during one visit or you have normal blood pressure with other risk factors, you may be asked to:  Return on a different day to have your blood pressure checked again.  Monitor your blood pressure at home for 1 week or longer. If you are diagnosed with hypertension, you may have other blood or imaging tests to help your health care provider understand your overall risk for other conditions. How is this treated? This condition is treated by making healthy lifestyle changes, such as eating healthy foods, exercising more, and reducing your alcohol intake. Your health care provider may prescribe medicine if lifestyle changes are not enough to get your blood pressure under control, and if:  Your systolic blood pressure is above 130.  Your diastolic blood pressure is above 80. Your personal target blood pressure may vary depending on your medical conditions, your age, and other factors. Follow these instructions at home: Eating and drinking   Eat a diet that is high in fiber and potassium, and low in sodium, added sugar, and fat. An example eating plan is called the DASH (Dietary Approaches to Stop Hypertension) diet. To eat this way: ? Eat plenty of fresh fruits and vegetables. Try to fill one half of your plate at each meal with fruits and vegetables. ? Eat whole grains, such as whole-wheat pasta, brown rice, or whole-grain bread. Fill about one fourth of your plate with whole grains. ? Eat or drink low-fat dairy products, such as skim milk or low-fat yogurt. ? Avoid fatty cuts of meat, processed or cured  meats, and poultry with skin. Fill about one fourth of your plate with lean proteins, such as fish, chicken without skin, beans, eggs, or tofu. ? Avoid pre-made and processed foods. These tend to be higher in sodium, added sugar, and fat.  Reduce your daily sodium intake. Most people with hypertension should eat  less than 1,500 mg of sodium a day.  Do not drink alcohol if: ? Your health care provider tells you not to drink. ? You are pregnant, may be pregnant, or are planning to become pregnant.  If you drink alcohol: ? Limit how much you use to:  0-1 drink a day for women.  0-2 drinks a day for men. ? Be aware of how much alcohol is in your drink. In the [place] one drink equals one 12 oz bottle of beer (355 mL), one 5 oz glass of wine (148 mL), or one 1 oz glass of hard liquor (44 mL). Lifestyle   Work with your health care provider to maintain a healthy body weight or to lose weight. Ask what an ideal weight is for you.  Get at least 30 minutes of exercise most days of the week. Activities may include walking, swimming, or biking.  Include exercise to strengthen your muscles (resistance exercise), such as Pilates or lifting weights, as part of your weekly exercise routine. Try to do these types of exercises for 30 minutes at least 3 days a week.  Do not use any products that contain nicotine or tobacco, such as cigarettes, e-cigarettes, and chewing tobacco. If you need help quitting, ask your health care provider.  Monitor your blood pressure at home as told by your health care provider.  Keep all follow-up visits as told by your health care provider. This is important. Medicines  Take over-the-counter and prescription medicines only as told by your health care provider. Follow directions carefully. Blood pressure medicines must be taken as prescribed.  Do not skip doses of blood pressure medicine. Doing this puts you at risk for problems and can make the medicine less  effective.  Ask your health care provider about side effects or reactions to medicines that you should watch for. Contact a health care provider if you:  Think you are having a reaction to a medicine you are taking.  Have headaches that keep coming back (recurring).  Feel dizzy.  Have swelling in your ankles.  Have trouble with your vision. Get help right away if you:  Develop a severe headache or confusion.  Have unusual weakness or numbness.  Feel faint.  Have severe pain in your chest or abdomen.  Vomit repeatedly.  Have trouble breathing. Summary  Hypertension is when the force of blood pumping through your arteries is too strong. If this condition is not controlled, it may put you at risk for serious complications.  Your personal target blood pressure may vary depending on your medical conditions, your age, and other factors. For most people, a normal blood pressure is less than 120/80.  Hypertension is treated with lifestyle changes, medicines, or a combination of both. Lifestyle changes include losing weight, eating a healthy, low-sodium diet, exercising more, and limiting alcohol. This information is not intended to replace advice given to you by your health care provider. Make sure you discuss any questions you have with your health care provider. Document Released: 06/22/2005 Document Revised: 03/02/2018 Document Reviewed: 03/02/2018 Elsevier Patient Education  [place].   Venlafaxine tablets What is this medicine? VENLAFAXINE (VEN la fax een) is used to treat depression, anxiety and panic disorder. This medicine may be used for other purposes; ask your health care provider or pharmacist if you have questions. COMMON BRAND NAME(S): Effexor What should I tell my health care provider before I take this medicine? They need to know if you have any of these conditions:  bleeding disorders  glaucoma  heart disease  high blood pressure  high  cholesterol  kidney disease  liver disease  low levels of sodium in the blood  mania or bipolar disorder  seizures  suicidal thoughts, plans, or attempt; a previous suicide attempt by you or a family  take medicines that treat or prevent blood clots  thyroid disease  an unusual or allergic reaction to venlafaxine, desvenlafaxine, other medicines, foods, dyes, or preservatives  pregnant or trying to get pregnant  breast-feeding How should I use this medicine? Take this medicine by mouth with a glass of water. Follow the directions on the prescription label. Take it with food. Take your medicine at regular intervals. Do not take your medicine more often than directed. Do not stop taking this medicine suddenly except upon the advice of your doctor. Stopping this medicine too quickly may cause serious side effects or your condition may worsen. A special MedGuide will be given to you by the pharmacist with each prescription and refill. Be sure to read this information carefully each time. Talk to your pediatrician regarding the use of this medicine in children. Special care may be needed. Overdosage: If you think you have taken too much of this medicine contact a poison control center or emergency room at once. NOTE: This medicine is only for you. Do not share this medicine with others. What if I miss a dose? If you miss a dose, take it as soon as you can. If it is almost time for your next dose, take only that dose. Do not take double or extra doses. What may interact with this medicine? Do not take this medicine with any of the following medications:  certain medicines for fungal infections like fluconazole, itraconazole, ketoconazole, posaconazole, voriconazole  cisapride  desvenlafaxine  dronedarone  duloxetine  levomilnacipran  linezolid  MAOIs like Carbex, Eldepryl, Marplan, Nardil, and Parnate  methylene blue (injected into a  vein)  milnacipran  pimozide  thioridazine This medicine may also interact with the following medications:  amphetamines  aspirin and aspirin-like medicines  certain medicines for depression, anxiety, or psychotic disturbances  certain medicines for migraine headaches like almotriptan, eletriptan, frovatriptan, naratriptan, rizatriptan, sumatriptan, zolmitriptan  certain medicines for sleep  certain medicines that treat or prevent blood clots like dalteparin, enoxaparin, warfarin  cimetidine  clozapine  diuretics  fentanyl  furazolidone  indinavir  isoniazid  lithium  metoprolol  NSAIDS, medicines for pain and inflammation, like ibuprofen or naproxen  other medicines that prolong the QT interval (cause an abnormal heart rhythm) like dofetilide, ziprasidone  procarbazine  rasagiline  supplements like St. John's wort, kava kava, valerian  tramadol  tryptophan This list may not describe all possible interactions. Give your health care provider a list of all the medicines, herbs, non-prescription drugs, or dietary supplements you use. Also tell them if you smoke, drink alcohol, or use illegal drugs. Some items may interact with your medicine. What should I watch for while using this medicine? Tell your doctor if your symptoms do not get better or if they get worse. Visit your doctor or health care professional for regular checks on your progress. Because it may take several weeks to see the full effects of this medicine, it is important to continue your treatment as prescribed by your doctor. Patients and their families should watch out for new or worsening thoughts of suicide or depression. Also watch out for sudden changes in feelings such as feeling anxious, agitated, panicky, irritable, hostile, aggressive, impulsive,  severely restless, overly excited and hyperactive, or not being able to sleep. If this happens, especially at the beginning of treatment or after  a change in dose, call your health care professional. This medicine can cause an increase in blood pressure. Check with your doctor for instructions on monitoring your blood pressure while taking this medicine. You may get drowsy or dizzy. Do not drive, use machinery, or do anything that needs mental alertness until you know how this medicine affects you. Do not stand or sit up quickly, especially if you are an older patient. This reduces the risk of dizzy or fainting spells. Alcohol may interfere with the effect of this medicine. Avoid alcoholic drinks. Your mouth may get dry. Chewing sugarless gum, sucking hard candy and drinking plenty of water will help. Contact your doctor if the problem does not go away or is severe. What side effects may I notice from receiving this medicine? Side effects that you should report to your doctor or health care professional as soon as possible:  allergic reactions like skin rash, itching or hives, swelling of the face, lips, or tongue  anxious  breathing problems  confusion  changes in vision  chest pain  confusion  elevated mood, decreased need for sleep, racing thoughts, impulsive behavior  eye pain  fast, irregular heartbeat  feeling faint or lightheaded, falls  feeling agitated, angry, or irritable  hallucination, loss of contact with reality  high blood pressure  loss of balance or coordination  palpitations  redness, blistering, peeling or loosening of the skin, including inside the mouth  restlessness, pacing, inability to keep still  seizures  stiff muscles  suicidal thoughts or other mood changes  trouble passing urine or change in the amount of urine  trouble sleeping  unusual bleeding or bruising  unusually weak or tired  vomiting Side effects that usually do not require medical attention (report to your doctor or health care professional if they continue or are bothersome):  change in sex drive or  performance  change in appetite or weight  constipation  dizziness  dry mouth  headache  increased sweating nausea Migraine Headache A migraine headache is an intense, throbbing pain on one side or both sides of the head. Migraine headaches may also cause other symptoms, such as nausea, vomiting, and sensitivity to light and noise. A migraine headache can last from 4 hours to 3 days. Talk with your doctor about what things may bring on (trigger) your migraine headaches. What are the causes? The exact cause of this condition is not known. However, a migraine may be caused when nerves in the brain become irritated and release chemicals that cause inflammation of blood vessels. This inflammation causes pain. This condition may be triggered or caused by: Drinking alcohol. Smoking. Taking medicines, such as: Medicine used to treat chest pain (nitroglycerin). Birth control pills. Estrogen. Certain blood pressure medicines. Eating or drinking products that contain nitrates, glutamate, aspartame, or tyramine. Aged cheeses, chocolate, or caffeine may also be triggers. Doing physical activity. Other things that may trigger a migraine headache include: Menstruation. Pregnancy. Hunger. Stress. Lack of sleep or too much sleep. Weather changes. Fatigue. What increases the risk? The following factors may make you more likely to experience migraine headaches: Being a certain age. This condition is more common in people who are 50-11 years old. Being female. Having a family history of migraine headaches. Being Caucasian. Having a mental health condition, such as depression or anxiety. Being obese. What are the signs  or symptoms? The main symptom of this condition is pulsating or throbbing pain. This pain may: Happen in any area of the head, such as on one side or both sides. Interfere with daily activities. Get worse with physical activity. Get worse with exposure to bright lights or  loud noises. Other symptoms may include: Nausea. Vomiting. Dizziness. General sensitivity to bright lights, loud noises, or smells. Before you get a migraine headache, you may get warning signs (an aura). An aura may include: Seeing flashing lights or having blind spots. Seeing bright spots, halos, or zigzag lines. Having tunnel vision or blurred vision. Having numbness or a tingling feeling. Having trouble talking. Having muscle weakness. Some people have symptoms after a migraine headache (postdromal phase), such as: Feeling tired. Difficulty concentrating. How is this diagnosed? A migraine headache can be diagnosed based on: Your symptoms. A physical exam. Tests, such as: CT scan or an MRI of the head. These imaging tests can help rule out other causes of headaches. Taking fluid from the spine (lumbar puncture) and analyzing it (cerebrospinal fluid analysis, or CSF analysis). How is this treated? This condition may be treated with medicines that: Relieve pain. Relieve nausea. Prevent migraine headaches. Treatment for this condition may also include: Acupuncture. Lifestyle changes like avoiding foods that trigger migraine headaches. Biofeedback. Cognitive behavioral therapy. Follow these instructions at home: Medicines Take over-the-counter and prescription medicines only as told by your health care provider. Ask your health care provider if the medicine prescribed to you: Requires you to avoid driving or using heavy machinery. Can cause constipation. You may need to take these actions to prevent or treat constipation: Drink enough fluid to keep your urine pale yellow. Take over-the-counter or prescription medicines. Eat foods that are high in fiber, such as beans, whole grains, and fresh fruits and vegetables. Limit foods that are high in fat and processed sugars, such as fried or sweet foods. Lifestyle Do not drink alcohol. Do not use any products that contain  nicotine or tobacco, such as cigarettes, e-cigarettes, and chewing tobacco. If you need help quitting, ask your health care provider. Get at least 8 hours of sleep every night. Find ways to manage stress, such as meditation, deep breathing, or yoga. General instructions     Keep a journal to find out what may trigger your migraine headaches. For example, write down: What you eat and drink. How much sleep you get. Any change to your diet or medicines. If you have a migraine headache: Avoid things that make your symptoms worse, such as bright lights. It may help to lie down in a dark, quiet room. Do not drive or use heavy machinery. Ask your health care provider what activities are safe for you while you are experiencing symptoms. Keep all follow-up visits as told by your health care provider. This is important. Contact a health care provider if: You develop symptoms that are different or more severe than your usual migraine headache symptoms. You have more than 15 headache days in one month. Get help right away if: Your migraine headache becomes severe. Your migraine headache lasts longer than 72 hours. You have a fever. You have a stiff neck. You have vision loss. Your muscles feel weak or like you cannot control them. You start to lose your balance often. You have trouble walking. You faint. You have a seizure. Summary A migraine headache is an intense, throbbing pain on one side or both sides of the head. Migraines may also cause other symptoms, such  as nausea, vomiting, and sensitivity to light and noise. This condition may be treated with medicines and lifestyle changes. You may also need to avoid certain things that trigger a migraine headache. Keep a journal to find out what may trigger your migraine headaches. Contact your health care provider if you have more than 15 headache days in a month or you develop symptoms that are different or more severe than your usual migraine  headache symptoms. This information is not intended to replace advice given to you by your health care provider. Make sure you discuss any questions you have with your health care provider. Document Released: 06/22/2005 Document Revised: 10/14/2018 Document Reviewed: 08/04/2018 Elsevier Patient Education  2020 [employer].    tired This list may not describe all possible side effects. Call your doctor for medical advice about side effects. You may report side effects to FDA at 1-800-FDA-1088. Where should I keep my medicine? Keep out of the reach of children. Store at a controlled temperature between 20 and 25 degrees C (68 and 77 degrees F), in a dry place. Throw away any unused medicine after the expiration date. NOTE: This sheet is a summary. It may not cover all possible information. If you have questions about this medicine, talk to your doctor, pharmacist, or health care provider.  2020 Elsevier/Gold Standard (2018-06-14 12:08:23)  Acetaminophen; Butalbital; Caffeine tablets or capsules What is this medicine? ACETAMINOPHEN; BUTALBITAL; CAFFEINE (a set a MEE noe fen; byoo TAL bi tal; KAF een) is a pain reliever. It is used to treat tension headaches. This medicine may be used for other purposes; ask your health care provider or pharmacist if you have questions. COMMON BRAND NAME(S): Alagesic, Americet, Anolor-300, Arcet, BAC, CAPACET, Dolgic Plus, Esgic, Esgic Plus, Ezol, Fioricet, [employer], Medigesic, [place], Pacaps, Phrenilin Forte, Repan, [place], Triad, Zebutal What should I tell my health care provider before I take this medicine? They need to know if you have any of these conditions:  drug abuse or addiction  heart or circulation problems  if you often drink alcohol  kidney disease or problems going to the bathroom  liver disease  lung disease, asthma, or breathing problems  porphyria  an unusual or allergic reaction to acetaminophen, butalbital or other  barbiturates, caffeine, other medicines, foods, dyes, or preservatives  pregnant or trying to get pregnant  breast-feeding How should I use this medicine? Take this medicine by mouth with a full glass of water. Follow the directions on the prescription label. If the medicine upsets your stomach, take the medicine with food or milk. Do not take more than you are told to take. Talk to your pediatrician regarding the use of this medicine in children. Special care may be needed. Overdosage: If you think you have taken too much of this medicine contact a poison control center or emergency room at once. NOTE: This medicine is only for you. Do not share this medicine with others. What if I miss a dose? If you miss a dose, take it as soon as you can. If it is almost time for your next dose, take only that dose. Do not take double or extra doses. What may interact with this medicine?  alcohol or medicines that contain alcohol  antidepressants, especially MAOIs like isocarboxazid, phenelzine, tranylcypromine, and selegiline  antihistamines  benzodiazepines  carbamazepine  isoniazid  medicines for pain like pentazocine, buprenorphine, butorphanol, nalbuphine, tramadol, and propoxyphene  muscle relaxants  naltrexone  phenobarbital, phenytoin, and fosphenytoin  phenothiazines like perphenazine, thioridazine, chlorpromazine,  mesoridazine, fluphenazine, prochlorperazine, promazine, and trifluoperazine  voriconazole This list may not describe all possible interactions. Give your health care provider a list of all the medicines, herbs, non-prescription drugs, or dietary supplements you use. Also tell them if you smoke, drink alcohol, or use illegal drugs. Some items may interact with your medicine. What should I watch for while using this medicine? Tell your doctor or health care professional if your pain does not go away, if it gets worse, or if you have new or a different type of pain. You  may develop tolerance to the medicine. Tolerance means that you will need a higher dose of the medicine for pain relief. Tolerance is normal and is expected if you take the medicine for a long time. Do not suddenly stop taking your medicine because you may develop a severe reaction. Your body becomes used to the medicine. This does NOT mean you are addicted. Addiction is a behavior related to getting and using a drug for a non-medical reason. If you have pain, you have a medical reason to take pain medicine. Your doctor will tell you how much medicine to take. If your doctor wants you to stop the medicine, the dose will be slowly lowered over time to avoid any side effects. You may get drowsy or dizzy when you first start taking the medicine or change doses. Do not drive, use machinery, or do anything that may be dangerous until you know how the medicine affects you. Stand or sit up slowly. Do not take other medicines that contain acetaminophen with this medicine. Always read labels carefully. If you have questions, ask your doctor or pharmacist. If you take too much acetaminophen get medical help right away. Too much acetaminophen can be very dangerous and cause liver damage. Even if you do not have symptoms, it is important to get help right away. What side effects may I notice from receiving this medicine? Side effects that you should report to your doctor or health care professional as soon as possible:  allergic reactions like skin rash, itching or hives, swelling of the face, lips, or tongue  breathing problems  confusion  feeling faint or lightheaded, falls  redness, blistering, peeling or loosening of the skin, including inside the mouth  seizure  stomach pain  yellowing of the eyes or skin Side effects that usually do not require medical attention (report to your doctor or health care professional if they continue or are bothersome):  constipation  nausea, vomiting This list may  not describe all possible side effects. Call your doctor for medical advice about side effects. You may report side effects to FDA at 1-800-FDA-1088. Where should I keep my medicine? Keep out of the reach of children. This medicine can be abused. Keep your medicine in a safe place to protect it from theft. Do not share this medicine with anyone. Selling or giving away this medicine is dangerous and against the law. This medicine may cause accidental overdose and death if it taken by other adults, children, or pets. Mix any unused medicine with a substance like cat litter or coffee grounds. Then throw the medicine away in a sealed container like a sealed bag or a coffee can with a lid. Do not use the medicine after the expiration date. Store at room temperature between 15 and 30 degrees C (59 and 86 degrees F). NOTE: This sheet is a summary. It may not cover all possible information. If you have questions about this medicine, talk to  your doctor, pharmacist, or health care provider.  2020 Elsevier/Gold Standard (2013-08-18 15:00:25)

## 2019-05-26 NOTE — Progress Notes (Signed)
Chief Complaint  Patient presents with  . Transitions Of Care    depressions and anxiety   TOC 1.  HTN BP elevated today 150/90 and prior BP when donating blood products 142/88 9/19, 140/100, 142/98, 172/88  2. Obesity long term disc healthy diet choices and exercise she was down 42 lbs but gained 20 back during the pandemic  3. Migraines with aura 1x per month  did not tolerate imitrex in the past wears glasses but does not like her Rx [facility] but feels she doesn't benefit from glasses Rx advised to go back  See sleep below  4. Anxiety/depression PHQ 9 score 13-15 and GAD 7 score 20 she is single mom who has lost 2 jobs in the past and this worries her for the future. Mood and anxiety worse x 2 months and wants to start medication. She picks at her fingers when nervous and has sores to b/l hands L>R She does have interrupted sleep qhs no problem falling asleep but wakes up 2-3 am and cant go back to sleep to 5 am  5. Right great toe previously removed podiatry and left great toe had trauma to toe with bruising x months ago still with bruise   Review of Systems  Constitutional: Negative for weight loss.  HENT: Negative for hearing loss.   Eyes: Negative for blurred vision.  Respiratory: Negative for shortness of breath.   Cardiovascular: Negative for chest pain.  Gastrointestinal: Negative for abdominal pain.  Musculoskeletal: Negative for falls.  Skin: Negative for rash.       Toenail discoloration left  Hand wounds   Neurological: Positive for headaches.  Psychiatric/Behavioral: Positive for depression. The patient is nervous/anxious and has insomnia.    Past Medical History:  Diagnosis Date  . Depression   . Frequent headaches   . Hypertension   . Migraine headache with aura    Past Surgical History:  Procedure Laterality Date  . CESAREAN SECTION  2006  . ear pinning  1984  . TONSILLECTOMY AND ADENOIDECTOMY  2011   Family History  Problem  Relation Age of Onset  . Cancer Mother        MM  . Arthritis Mother   . Hyperlipidemia Mother   . Hypertension Mother   . Anxiety disorder Mother   . Arthritis Father   . Cancer Maternal Grandmother   . Hypertension Maternal Grandmother   . Hypertension Maternal Grandfather   . Cancer Paternal Grandfather        skin cancer    Social History   Socioeconomic History  . Marital status: Legally Separated    Spouse name: Not on file  . Number of children: Not on file  . Years of education: Not on file  . Highest education level: Not on file  Occupational History  . Not on file  Social Needs  . Financial resource strain: Not on file  . Food insecurity    Worry: Not on file    Inability: Not on file  . Transportation needs    Medical: Not on file    Non-medical: Not on file  Tobacco Use  . Smoking status: Never Smoker  . Smokeless tobacco: Never Used  Substance and Sexual Activity  . Alcohol use: Yes  . Drug use: No  . Sexual activity: Yes  Lifestyle  . Physical activity    Days per week: Not on file    Minutes per session: Not on file  .  Stress: Not on file  Relationships  . Social Herbalist on phone: Not on file    Gets together: Not on file    Attends religious service: Not on file    Active member of club or organization: Not on file    Attends meetings of clubs or organizations: Not on file    Relationship status: Not on file  . Intimate partner violence    Fear of current or ex partner: Not on file    Emotionally abused: Not on file    Physically abused: Not on file    Forced sexual activity: Not on file  Other Topics Concern  . Not on file  Social History Narrative   Single mom    41 y.o daughter    Never smoker    Current Meds  Medication Sig  . Prenatal Vit-Fe Fumarate-FA (PNV PRENATAL PLUS MULTIVITAMIN) 27-1 MG TABS Take 1 tablet by mouth daily.   Allergies  Allergen Reactions  . Imitrex [Sumatriptan]     myalgias   No results  found for this or any previous visit (from the past 2160 hour(s)). Objective  Body mass index is 55.33 kg/m. Wt Readings from Last 3 Encounters:  05/26/19 (!) 342 lb 12.8 oz (155.5 kg)  07/22/15 (!) 328 lb 4 oz (148.9 kg)  11/28/14 (!) 318 lb 8 oz (144.5 kg)   Temp Readings from Last 3 Encounters:  05/26/19 97.9 F (36.6 C) (Temporal)  07/22/15 98 F (36.7 C) (Oral)  11/28/14 98.3 F (36.8 C) (Oral)   BP Readings from Last 3 Encounters:  05/26/19 (!) 150/90  07/22/15 132/74  11/28/14 126/80   Pulse Readings from Last 3 Encounters:  05/26/19 92  07/22/15 98  11/28/14 98    Physical Exam Vitals signs and nursing note (+mask on ) reviewed.  Constitutional:      Appearance: Normal appearance. She is well-developed and well-groomed. She is morbidly obese.  HENT:     Head: Normocephalic and atraumatic.  Eyes:     Conjunctiva/sclera: Conjunctivae normal.     Pupils: Pupils are equal, round, and reactive to light.  Cardiovascular:     Rate and Rhythm: Normal rate and regular rhythm.     Heart sounds: Normal heart sounds. No murmur.  Pulmonary:     Effort: Pulmonary effort is normal.     Breath sounds: Normal breath sounds.  Abdominal:     General: Abdomen is flat. Bowel sounds are normal.     Tenderness: There is no abdominal tenderness.  Skin:    General: Skin is warm and dry.  Neurological:     General: No focal deficit present.     Mental Status: She is alert and oriented to person, place, and time. Mental status is at baseline.     Gait: Gait normal.  Psychiatric:        Attention and Perception: Attention and perception normal.        Mood and Affect: Mood and affect normal.        Speech: Speech normal.        Behavior: Behavior normal. Behavior is cooperative.        Thought Content: Thought content normal.        Cognition and Memory: Cognition and memory normal.        Judgment: Judgment normal.     Assessment  Plan  Essential hypertension - Plan:  amLODipine (NORVASC) 2.5 MG tablet, Comprehensive metabolic panel, CBC with Differential/Platelet, Lipid panel,  TSH, T4, free Monitor BP   Anxiety and depression - Plan: venlafaxine XR (EFFEXOR XR) 37.5 MG 24 hr capsule  Migraine with aura and without status migrainosus, not intractable - Plan: butalbital-acetaminophen-caffeine (FIORICET) 50-325-40 MG tablet If this does not help consider BB for px disc topamax as well  Consider neurology in future with sleep study r/o OSA   Open wound of hand without foreign body, unspecified laterality, unspecified wound type, initial encounter - Plan: mupirocin ointment (BACTROBAN) 2 % rec stop picking seems to be nervous habit   Morbid obesity (HCC) BMI >55  rec healthy diet and exercise given info Consider wt loss clinic in [place] in future will readdress at f/u   HM Flu shot 04/12/19  Tdap given today  Consider HPV vaccine with ob/gyn if has not had   Mammogram referred Pap referred ob/g[facility]  Consider colonoscopy age 43  Skin no current issues FH skin cancer MM consider derm in the future   Provider: Dr. French Anaracy [facility]

## 2019-05-28 ENCOUNTER — Encounter: Payer: Self-pay | Admitting: Internal Medicine

## 2019-05-28 DIAGNOSIS — F329 Major depressive disorder, single episode, unspecified: Secondary | ICD-10-CM | POA: Insufficient documentation

## 2019-05-28 DIAGNOSIS — F32A Depression, unspecified: Secondary | ICD-10-CM | POA: Insufficient documentation

## 2019-05-28 DIAGNOSIS — S90222S Contusion of left lesser toe(s) with damage to nail, sequela: Secondary | ICD-10-CM | POA: Insufficient documentation

## 2019-05-28 DIAGNOSIS — G43109 Migraine with aura, not intractable, without status migrainosus: Secondary | ICD-10-CM | POA: Insufficient documentation

## 2019-05-28 DIAGNOSIS — I10 Essential (primary) hypertension: Secondary | ICD-10-CM | POA: Insufficient documentation

## 2019-05-30 ENCOUNTER — Telehealth: Payer: Self-pay | Admitting: Obstetrics and Gynecology

## 2019-05-30 NOTE — Telephone Encounter (Signed)
[facility] referring for Routine cervical smear. Voicemail box is full unable to leave message

## 2019-06-06 NOTE — Telephone Encounter (Signed)
Voicemail box is full - unable to leave message

## 2019-06-07 ENCOUNTER — Other Ambulatory Visit: Payer: Self-pay

## 2019-06-09 ENCOUNTER — Other Ambulatory Visit: Payer: Self-pay | Admitting: Internal Medicine

## 2019-06-09 ENCOUNTER — Other Ambulatory Visit (INDEPENDENT_AMBULATORY_CARE_PROVIDER_SITE_OTHER): Payer: 59

## 2019-06-09 ENCOUNTER — Encounter: Payer: Self-pay | Admitting: Internal Medicine

## 2019-06-09 ENCOUNTER — Other Ambulatory Visit: Payer: Self-pay

## 2019-06-09 DIAGNOSIS — R739 Hyperglycemia, unspecified: Secondary | ICD-10-CM

## 2019-06-09 DIAGNOSIS — E538 Deficiency of other specified B group vitamins: Secondary | ICD-10-CM

## 2019-06-09 DIAGNOSIS — E611 Iron deficiency: Secondary | ICD-10-CM | POA: Diagnosis not present

## 2019-06-09 DIAGNOSIS — I10 Essential (primary) hypertension: Secondary | ICD-10-CM

## 2019-06-09 DIAGNOSIS — E559 Vitamin D deficiency, unspecified: Secondary | ICD-10-CM | POA: Insufficient documentation

## 2019-06-09 DIAGNOSIS — Z1329 Encounter for screening for other suspected endocrine disorder: Secondary | ICD-10-CM

## 2019-06-09 DIAGNOSIS — R7303 Prediabetes: Secondary | ICD-10-CM | POA: Insufficient documentation

## 2019-06-09 DIAGNOSIS — Z1389 Encounter for screening for other disorder: Secondary | ICD-10-CM

## 2019-06-09 LAB — T4, FREE: Free T4: 0.88 ng/dL (ref 0.60–1.60)

## 2019-06-09 LAB — LIPID PANEL
Cholesterol: 165 mg/dL (ref 0–200)
HDL: 49.3 mg/dL (ref >39.00)
LDL Cholesterol: 94 mg/dL (ref 0–99)
NonHDL: 115.26
Total CHOL/HDL Ratio: 3
Triglycerides: 108 mg/dL (ref 0.0–149.0)
VLDL: 21.6 mg/dL (ref 0.0–40.0)

## 2019-06-09 LAB — CBC WITH DIFFERENTIAL/PLATELET
Basophils Absolute: 0.1 10*3/uL (ref 0.0–0.1)
Basophils Relative: 0.8 % (ref 0.0–3.0)
Eosinophils Absolute: 0.1 10*3/uL (ref 0.0–0.7)
Eosinophils Relative: 1.3 % (ref 0.0–5.0)
HCT: 40.7 % (ref 36.0–46.0)
Hemoglobin: 13.4 g/dL (ref 12.0–15.0)
Lymphocytes Relative: 26 % (ref 12.0–46.0)
Lymphs Abs: 2 10*3/uL (ref 0.7–4.0)
MCHC: 32.9 g/dL (ref 30.0–36.0)
MCV: 84.8 fl (ref 78.0–100.0)
Monocytes Absolute: 0.4 10*3/uL (ref 0.1–1.0)
Monocytes Relative: 5.5 % (ref 3.0–12.0)
Neutro Abs: 5.2 10*3/uL (ref 1.4–7.7)
Neutrophils Relative %: 66.4 % (ref 43.0–77.0)
Platelets: 357 10*3/uL (ref 150.0–400.0)
RBC: 4.8 Mil/uL (ref 3.87–5.11)
RDW: 14 % (ref 11.5–15.5)
WBC: 7.8 10*3/uL (ref 4.0–10.5)

## 2019-06-09 LAB — TSH: TSH: 1.85 u[IU]/mL (ref 0.35–4.50)

## 2019-06-09 LAB — COMPREHENSIVE METABOLIC PANEL
ALT: 40 U/L — ABNORMAL HIGH (ref 0–35)
AST: 22 U/L (ref 0–37)
Albumin: 4.4 g/dL (ref 3.5–5.2)
Alkaline Phosphatase: 76 U/L (ref 39–117)
BUN: 10 mg/dL (ref 6–23)
CO2: 25 mEq/L (ref 19–32)
Calcium: 9.2 mg/dL (ref 8.4–10.5)
Chloride: 102 mEq/L (ref 96–112)
Creatinine, Ser: 0.82 mg/dL (ref 0.40–1.20)
GFR: 76.7 mL/min (ref >60.00)
Glucose, Bld: 95 mg/dL (ref 70–99)
Potassium: 4.3 mEq/L (ref 3.5–5.1)
Sodium: 138 mEq/L (ref 135–145)
Total Bilirubin: 0.6 mg/dL (ref 0.2–1.2)
Total Protein: 7 g/dL (ref 6.0–8.3)

## 2019-06-09 LAB — VITAMIN B12: Vitamin B-12: 289 pg/mL (ref 211–911)

## 2019-06-09 LAB — VITAMIN D 25 HYDROXY (VIT D DEFICIENCY, FRACTURES): VITD: 14.02 ng/mL — ABNORMAL LOW (ref 30.00–100.00)

## 2019-06-09 LAB — HEMOGLOBIN A1C: Hgb A1c MFr Bld: 5.8 % (ref 4.6–6.5)

## 2019-06-09 MED ORDER — CHOLECALCIFEROL 1.25 MG (50000 UT) PO CAPS: 50000.0000 [IU] | ORAL_CAPSULE | ORAL | 1 refills | Status: DC

## 2019-06-10 LAB — URINALYSIS, ROUTINE W REFLEX MICROSCOPIC
Bacteria, UA: NONE SEEN /HPF
Bilirubin Urine: NEGATIVE
Glucose, UA: NEGATIVE
Hgb urine dipstick: NEGATIVE
Hyaline Cast: NONE SEEN /LPF
Ketones, ur: NEGATIVE
Nitrite: NEGATIVE
Protein, ur: NEGATIVE
RBC / HPF: NONE SEEN /HPF (ref 0–2)
Specific Gravity, Urine: 1.014 (ref 1.001–1.03)
pH: 6.5 (ref 5.0–8.0)

## 2019-06-15 ENCOUNTER — Encounter: Payer: Self-pay | Admitting: Internal Medicine

## 2019-06-15 ENCOUNTER — Telehealth: Payer: Self-pay

## 2019-06-15 NOTE — Telephone Encounter (Signed)
Pt calling Shannon Clayton back to schedule her referral. I have let pt know sara will be back in the morning and that I will be sending her this message. I have advised pt to be on the lookout for a [phone] number. Thank you

## 2019-06-16 NOTE — Telephone Encounter (Signed)
Patient is schedule for 07/27/19 with [facility]

## 2019-06-22 ENCOUNTER — Ambulatory Visit
Admission: RE | Admit: 2019-06-22 | Discharge: 2019-06-22 | Disposition: A | Discharge status: Home / Self Care | Payer: 59 | Source: Ambulatory Visit | Attending: Internal Medicine | Admitting: Internal Medicine

## 2019-06-22 DIAGNOSIS — Z1231 Encounter for screening mammogram for malignant neoplasm of breast: Secondary | ICD-10-CM | POA: Insufficient documentation

## 2019-06-23 ENCOUNTER — Other Ambulatory Visit: Payer: Self-pay

## 2019-06-23 ENCOUNTER — Ambulatory Visit (INDEPENDENT_AMBULATORY_CARE_PROVIDER_SITE_OTHER): Payer: 59 | Admitting: Internal Medicine

## 2019-06-23 ENCOUNTER — Encounter: Payer: Self-pay | Admitting: Internal Medicine

## 2019-06-23 VITALS — Ht 66.0 in | Wt 340.0 lb

## 2019-06-23 DIAGNOSIS — Z0000 Encounter for general adult medical examination without abnormal findings: Secondary | ICD-10-CM | POA: Diagnosis not present

## 2019-06-23 DIAGNOSIS — R748 Abnormal levels of other serum enzymes: Secondary | ICD-10-CM | POA: Insufficient documentation

## 2019-06-23 DIAGNOSIS — L03311 Cellulitis of abdominal wall: Secondary | ICD-10-CM

## 2019-06-23 DIAGNOSIS — R7303 Prediabetes: Secondary | ICD-10-CM

## 2019-06-23 DIAGNOSIS — E559 Vitamin D deficiency, unspecified: Secondary | ICD-10-CM

## 2019-06-23 DIAGNOSIS — F419 Anxiety disorder, unspecified: Secondary | ICD-10-CM

## 2019-06-23 DIAGNOSIS — F329 Major depressive disorder, single episode, unspecified: Secondary | ICD-10-CM

## 2019-06-23 DIAGNOSIS — Z1211 Encounter for screening for malignant neoplasm of colon: Secondary | ICD-10-CM | POA: Insufficient documentation

## 2019-06-23 MED ORDER — FLUCONAZOLE 150 MG PO TABS: 150.0000 mg | ORAL_TABLET | Freq: Once | ORAL | 0 refills | Status: AC

## 2019-06-23 MED ORDER — DOXYCYCLINE HYCLATE 100 MG PO TABS: 100.0000 mg | ORAL_TABLET | Freq: Two times a day (BID) | ORAL | 0 refills | Status: DC

## 2019-06-23 NOTE — Progress Notes (Signed)
Virtual Visit via Video Note  I connected with Shannon Clayton  on 06/23/19 at  1:00 PM EST by a video enabled telemedicine application and verified that I am speaking with the correct person using two identifiers.  Location patient: home Location provider:work or home office Persons participating in the virtual visit: patient, provider  I discussed the limitations of evaluation and management by telemedicine and the availability of in person appointments. The patient expressed understanding and agreed to proceed.   HPI: Annual  1. She has anxiety and picks at her skin left abdomen with red area she has picked at since the last visit using bactroban w/o relief no pain. Area is red with crusting  2. HTN on norvasc 2.5 not checking BP  3. Migraines not picked up fioricet 2/2 cost but will pick up getting stress triggers h/a  4. Vitamin D low taking D3 50K IU Q Friday and B12 1000 qd  5. Anxiety and depression on effexor 37.5 qd still with anxiety improved but still there    ROS: See pertinent positives and negatives per HPI. General: wt stable  HEENT: normal hearing  CV: no chest pain  Lungs: no sob GI: no ab pain  GU: no issues  MSK: no pain  Skin open wound to left abdomen Neuro: +h/a Pscyh: +anxiety  Past Medical History:  Diagnosis Date  . Depression   . Frequent headaches   . Hypertension   . Migraine headache with aura     Past Surgical History:  Procedure Laterality Date  . CESAREAN SECTION  2006  . ear pinning  1984  . TONSILLECTOMY AND ADENOIDECTOMY  2011    Family History  Problem Relation Age of Onset  . Cancer Mother        MM  . Arthritis Mother   . Hyperlipidemia Mother   . Hypertension Mother   . Anxiety disorder Mother   . Arthritis Father   . Cancer Maternal Grandmother   . Hypertension Maternal Grandmother   . Hypertension Maternal Grandfather   . Cancer Paternal Grandfather        skin cancer   . Breast cancer Neg Hx     SOCIAL HX:  Single  mom  41 y.o daughter  Never smoker     Current Outpatient Medications:  .  amLODipine (NORVASC) 2.5 MG tablet, Take 1 tablet (2.5 mg total) by mouth daily., Disp: 90 tablet, Rfl: 3 .  butalbital-acetaminophen-caffeine (FIORICET) 50-325-40 MG tablet, Take 1-2 tablets by mouth daily as needed for headache., Disp: 30 tablet, Rfl: 0 .  Cholecalciferol 1.25 MG (50000 UT) capsule, Take 1 capsule (50,000 Units total) by mouth once a week., Disp: 13 capsule, Rfl: 1 .  doxycycline (VIBRA-TABS) 100 MG tablet, Take 1 tablet (100 mg total) by mouth 2 (two) times daily. With food, Disp: 14 tablet, Rfl: 0 .  fluconazole (DIFLUCAN) 150 MG tablet, Take 1 tablet (150 mg total) by mouth once for 1 dose., Disp: 1 tablet, Rfl: 0 .  mupirocin ointment (BACTROBAN) 2 %, Apply 1 application topically 2 (two) times daily. Hands, Disp: 30 g, Rfl: 2 .  Prenatal Vit-Fe Fumarate-FA (PNV PRENATAL PLUS MULTIVITAMIN) 27-1 MG TABS, Take 1 tablet by mouth daily., Disp: 30 tablet, Rfl: 6 .  venlafaxine XR (EFFEXOR XR) 37.5 MG 24 hr capsule, Take 1 capsule (37.5 mg total) by mouth daily with breakfast., Disp: 90 capsule, Rfl: 0  EXAM:  VITALS per patient if applicable:  GENERAL: alert, oriented, appears well and in no  acute distress  HEENT: atraumatic, conjunttiva clear, no obvious abnormalities on inspection of external nose and ears  NECK: normal movements of the head and neck  LUNGS: on inspection no signs of respiratory distress, breathing rate appears normal, no obvious gross SOB, gasping or wheezing  CV: no obvious cyanosis  MS: moves all visible extremities without noticeable abnormality  PSYCH/NEURO: pleasant and cooperative, no obvious depression or anxiety, speech and thought processing grossly intact  ASSESSMENT AND PLAN:  Discussed the following assessment and plan:  Annual physical exam Flu shot 04/12/19  Tdap utd  Consider HPV vaccine with ob/gyn if has not had   Mammogram had 06/22/2019 pending  result Pap referred ob/gyn [facility] appt 07/27/19 Consider colonoscopy age 9  Skin no current issues I.e moles/skin cancerFH skin cancer MM consider derm in the future   Cellulitis of abdominal wall - Plan: doxycycline (VIBRA-TABS) 100 MG tablet, fluconazole (DIFLUCAN) 150 MG tablet  Elevated liver enzymes - Plan: US Abdomen Complete  Morbid obesity (HCC) rec healthy diet and exercise/prediabetes A1C 5.8   Vitamin D deficiency 50K 1x per weekly  Anxiety and depression Cont meds consider increase to 75 mg qd in the future effexor if anxiety not improved    -we discussed possible serious and likely etiologies, options for evaluation and workup, limitations of telemedicine visit vs in person visit, treatment, treatment risks and precautions. Pt prefers to treat via telemedicine empirically rather then risking or undertaking an in person visit at this moment. Patient agrees to seek prompt in person care if worsening, new symptoms arise, or if is not improving with treatment.   I discussed the assessment and treatment plan with the patient. The patient was provided an opportunity to ask questions and all were answered. The patient agreed with the plan and demonstrated an understanding of the instructions.   The patient was advised to call back or seek an in-person evaluation if the symptoms worsen or if the condition fails to improve as anticipated.  Time spent 15 minutes Bevelyn Buckles, MD

## 2019-06-23 NOTE — Patient Instructions (Signed)
Nature made L theanine 100-200 mg daily for stress/anxiety  Aromatherapy I.e lavender    Preventing Type 2 Diabetes Mellitus Type 2 diabetes (type 2 diabetes mellitus) is a long-term (chronic) disease that affects blood sugar (glucose) levels. Normally, a hormone called insulin allows glucose to enter cells in the body. The cells use glucose for energy. In type 2 diabetes, one or both of these problems may be present:  The body does not make enough insulin.  The body does not respond properly to insulin that it makes (insulin resistance). Insulin resistance or lack of insulin causes excess glucose to build up in the blood instead of going into cells. As a result, high blood glucose (hyperglycemia) develops, which can cause many complications. Being overweight or obese and having an inactive (sedentary) lifestyle can increase your risk for diabetes. Type 2 diabetes can be delayed or prevented by making certain nutrition and lifestyle changes. What nutrition changes can be made?   Eat healthy meals and snacks regularly. Keep a healthy snack with you for when you get hungry between meals, such as fruit or a handful of nuts.  Eat lean meats and proteins that are low in saturated fats, such as chicken, fish, egg whites, and beans. Avoid processed meats.  Eat plenty of fruits and vegetables and plenty of grains that have not been processed (whole grains). It is recommended that you eat: ? 1?2 cups of fruit every day. ? 2?3 cups of vegetables every day. ? 6?8 oz of whole grains every day, such as oats, whole wheat, bulgur, brown rice, quinoa, and millet.  Eat low-fat dairy products, such as milk, yogurt, and cheese.  Eat foods that contain healthy fats, such as nuts, avocado, olive oil, and canola oil.  Drink water throughout the day. Avoid drinks that contain added sugar, such as soda or sweet tea.  Follow instructions from your health care provider about specific eating or drinking  restrictions.  Control how much food you eat at a time (portion size). ? Check food labels to find out the serving sizes of foods. ? Use a kitchen scale to weigh amounts of foods.  Saute or steam food instead of frying it. Cook with water or broth instead of oils or butter.  Limit your intake of: ? Salt (sodium). Have no more than 1 tsp (2,400 mg) of sodium a day. If you have heart disease or high blood pressure, have less than ? tsp (1,500 mg) of sodium a day. ? Saturated fat. This is fat that is solid at room temperature, such as butter or fat on meat. What lifestyle changes can be made? Activity   Do moderate-intensity physical activity for at least 30 minutes on at least 5 days of the week, or as much as told by your health care provider.  Ask your health care provider what activities are safe for you. A mix of physical activities may be best, such as walking, swimming, cycling, and strength training.  Try to add physical activity into your day. For example: ? Park in spots that are farther away than usual, so that you walk more. For example, park in a far corner of the parking lot when you go to the office or the grocery store. ? Take a walk during your lunch break. ? Use stairs instead of elevators or escalators. Weight Loss  Lose weight as directed. Your health care provider can determine how much weight loss is best for you and can help you lose weight safely.  If you are overweight or obese, you may be instructed to lose at least 5?7 % of your body weight. Alcohol and Tobacco   Limit alcohol intake to no more than 1 drink a day for nonpregnant women and 2 drinks a day for men. One drink equals 12 oz of beer, 5 oz of wine, or 1 oz of hard liquor.  Do not use any tobacco products, such as cigarettes, chewing tobacco, and e-cigarettes. If you need help quitting, ask your health care provider. Work With [place] Provider  Have your blood glucose tested regularly,  as told by your health care provider.  Discuss your risk factors and how you can reduce your risk for diabetes.  Get screening tests as told by your health care provider. You may have screening tests regularly, especially if you have certain risk factors for type 2 diabetes.  Make an appointment with a diet and nutrition specialist (registered dietitian). A registered dietitian can help you make a healthy eating plan and can help you understand portion sizes and food labels. Why are these changes important?  It is possible to prevent or delay type 2 diabetes and related health problems by making lifestyle and nutrition changes.  It can be difficult to recognize signs of type 2 diabetes. The best way to avoid possible damage to your body is to take actions to prevent the disease before you develop symptoms. What can happen if changes are not made?  Your blood glucose levels may keep increasing. Having high blood glucose for a long time is dangerous. Too much glucose in your blood can damage your blood vessels, heart, kidneys, nerves, and eyes.  You may develop prediabetes or type 2 diabetes. Type 2 diabetes can lead to many chronic health problems and complications, such as: ? Heart disease. ? Stroke. ? Blindness. ? Kidney disease. ? Depression. ? Poor circulation in the feet and legs, which could lead to surgical removal (amputation) in severe cases. Where to find support  Ask your health care provider to recommend a registered dietitian, diabetes educator, or weight loss program.  Look for local or online weight loss groups.  Join a gym, fitness club, or outdoor activity group, such as a walking club. Where to find more information To learn more about diabetes and diabetes prevention, visit:  American Diabetes Association (ADA): www.diabetes.[employer] of Diabetes and Digestive and Kidney Diseases: FindSpin.nl To learn more about  healthy eating, visit:  The U.S. Department of Agriculture Scientist, research (physical sciences)), Choose My Plate: http://wiley-williams.com/  Office of Disease Prevention and Health Promotion (ODPHP), Dietary Guidelines: SurferLive.at Summary  You can reduce your risk for type 2 diabetes by increasing your physical activity, eating healthy foods, and losing weight as directed.  Talk with your health care provider about your risk for type 2 diabetes. Ask about any blood tests or screening tests that you need to have. This information is not intended to replace advice given to you by your health care provider. Make sure you discuss any questions you have with your health care provider. Document Released: 10/14/2015 Document Revised: 10/14/2018 Document Reviewed: 08/13/2015 Elsevier Patient Education  2020 [employer].  Prediabetes Eating Plan Prediabetes is a condition that causes blood sugar (glucose) levels to be higher than normal. This increases the risk for developing diabetes. In order to prevent diabetes from developing, your health care provider may recommend a diet and other lifestyle changes to help you:  Control your blood glucose levels.  Improve your cholesterol levels.  Manage your blood pressure. Your health care provider may recommend working with a diet and nutrition specialist (dietitian) to make a meal plan that is best for you. What are tips for following this plan? Lifestyle  Set weight loss goals with the help of your health care team. It is recommended that most people with prediabetes lose 7% of their current body weight.  Exercise for at least 30 minutes at least 5 days a week.  Attend a support group or seek ongoing support from a mental health counselor.  Take over-the-counter and prescription medicines only as told by your health care provider. Reading food labels  Read food labels to check the amount of fat, salt (sodium), and sugar in prepackaged foods.  Avoid foods that have: ? Saturated fats. ? Trans fats. ? Added sugars.  Avoid foods that have more than 300 milligrams (mg) of sodium per serving. Limit your daily sodium intake to less than 2,300 mg each day. Shopping  Avoid buying pre-made and processed foods. Cooking  Cook with olive oil. Do not use butter, lard, or ghee.  Bake, broil, grill, or boil foods. Avoid frying. Meal planning   Work with your dietitian to develop an eating plan that is right for you. This may include: ? Tracking how many calories you take in. Use a food diary, notebook, or mobile application to track what you eat at each meal. ? Using the glycemic index (GI) to plan your meals. The index tells you how quickly a food will raise your blood glucose. Choose low-GI foods. These foods take a longer time to raise blood glucose.  Consider following a Mediterranean diet. This diet includes: ? Several servings each day of fresh fruits and vegetables. ? Eating fish at least twice a week. ? Several servings each day of whole grains, beans, nuts, and seeds. ? Using olive oil instead of other fats. ? Moderate alcohol consumption. ? Eating small amounts of red meat and whole-fat dairy.  If you have high blood pressure, you may need to limit your sodium intake or follow a diet such as the DASH eating plan. DASH is an eating plan that aims to lower high blood pressure. What foods are recommended? The items listed below may not be a complete list. Talk with your dietitian about what dietary choices are best for you. Grains Whole grains, such as whole-wheat or whole-grain breads, crackers, cereals, and pasta. Unsweetened oatmeal. Bulgur. Barley. Quinoa. Brown rice. Corn or whole-wheat flour tortillas or taco shells. Vegetables Lettuce. Spinach. Peas. Beets. Cauliflower. Cabbage. Broccoli. Carrots. Tomatoes. Squash. Eggplant. Herbs. Peppers. Onions. Cucumbers. Brussels sprouts. Fruits Berries. Bananas. Apples. Oranges.  Grapes. Papaya. Mango. Pomegranate. Kiwi. Grapefruit. Cherries. Meats and other protein foods Seafood. Poultry without skin. Lean cuts of pork and beef. Tofu. Eggs. Nuts. Beans. Dairy Low-fat or fat-free dairy products, such as yogurt, cottage cheese, and cheese. Beverages Water. Tea. Coffee. Sugar-free or diet soda. Seltzer water. Lowfat or no-fat milk. Milk alternatives, such as soy or almond milk. Fats and oils Olive oil. Canola oil. Sunflower oil. Grapeseed oil. Avocado. Walnuts. Sweets and desserts Sugar-free or low-fat pudding. Sugar-free or low-fat ice cream and other frozen treats. Seasoning and other foods Herbs. Sodium-free spices. Mustard. Relish. Low-fat, low-sugar ketchup. Low-fat, low-sugar barbecue sauce. Low-fat or fat-free mayonnaise. What foods are not recommended? The items listed below may not be a complete list. Talk with your dietitian about what dietary choices are best for you. Grains Refined white flour and flour products, such as bread, pasta,  snack foods, and cereals. Vegetables Canned vegetables. Frozen vegetables with butter or cream sauce. Fruits Fruits canned with syrup. Meats and other protein foods Fatty cuts of meat. Poultry with skin. Breaded or fried meat. Processed meats. Dairy Full-fat yogurt, cheese, or milk. Beverages Sweetened drinks, such as sweet iced tea and soda. Fats and oils Butter. Lard. Ghee. Sweets and desserts Baked goods, such as cake, cupcakes, pastries, cookies, and cheesecake. Seasoning and other foods Spice mixes with added salt. Ketchup. Barbecue sauce. Mayonnaise. Summary  To prevent diabetes from developing, you may need to make diet and other lifestyle changes to help control blood sugar, improve cholesterol levels, and manage your blood pressure.  Set weight loss goals with the help of your health care team. It is recommended that most people with prediabetes lose 7 percent of their current body weight.  Consider  following a Mediterranean diet that includes plenty of fresh fruits and vegetables, whole grains, beans, nuts, seeds, fish, lean meat, low-fat dairy, and healthy oils. This information is not intended to replace advice given to you by your health care provider. Make sure you discuss any questions you have with your health care provider. Document Released: 11/06/2014 Document Revised: 10/14/2018 Document Reviewed: 08/26/2016 Elsevier Patient Education  2020 Else[employer] Inc.  Mindfulness-Based Stress Reduction Mindfulness-based stress reduction (MBSR) is a program that helps people learn to practice mindfulness. Mindfulness is the practice of intentionally paying attention to the present moment. It can be learned and practiced through techniques such as education, breathing exercises, meditation, and yoga. MBSR includes several mindfulness techniques in one program. MBSR works best when you understand the treatment, are willing to try new things, and can commit to spending time practicing what you learn. MBSR training may include learning about:  How your emotions, thoughts, and reactions affect your body.  New ways to respond to things that cause negative thoughts to start (triggers).  How to notice your thoughts and let go of them.  Practicing awareness of everyday things that you normally do without thinking.  The techniques and goals of different types of meditation. What are the benefits of MBSR? MBSR can have many benefits, which include helping you to:  Develop self-awareness. This refers to knowing and understanding yourself.  Learn skills and attitudes that help you to participate in your own health care.  Learn new ways to care for yourself.  Be more accepting about how things are, and let things go.  Be less judgmental and approach things with an open mind.  Be patient with yourself and trust yourself more. MBSR has also been shown to:  Reduce negative emotions, such as  depression and anxiety.  Improve memory and focus.  Change how you sense and approach pain.  Boost your body's ability to fight infections.  Help you connect better with other people.  Improve your sense of well-being. Follow these instructions at home:   Find a local in-person or online MBSR program.  Set aside some time regularly for mindfulness practice.  Find a mindfulness practice that works best for you. This may include one or more of the following: ? Meditation. Meditation involves focusing your mind on a certain thought or activity. ? Breathing awareness exercises. These help you to stay present by focusing on your breath. ? Body scan. For this practice, you lie down and pay attention to each part of your body from head to toe. You can identify tension and soreness and intentionally relax parts of your body. ? Yoga. Yoga involves  stretching and breathing, and it can improve your ability to move and be flexible. It can also provide an experience of testing your body's limits, which can help you release stress. ? Mindful eating. This way of eating involves focusing on the taste, texture, color, and smell of each bite of food. Because this slows down eating and helps you feel full sooner, it can be an important part of a weight-loss plan.  Find a podcast or recording that provides guidance for breathing awareness, body scan, or meditation exercises. You can listen to these any time when you have a free moment to rest without distractions.  Follow your treatment plan as told by your health care provider. This may include taking regular medicines and making changes to your diet or lifestyle as recommended. How to practice mindfulness To do a basic awareness exercise:  Find a comfortable place to sit.  Pay attention to the present moment. Observe your thoughts, feelings, and surroundings just as they are.  Avoid placing judgment on yourself, your feelings, or your surroundings.  Make note of any judgment that comes up, and let it go.  Your mind may wander, and that is okay. Make note of when your thoughts drift, and return your attention to the present moment. To do basic mindfulness meditation:  Find a comfortable place to sit. This may include a stable chair or a firm floor cushion. ? Sit upright with your back straight. Let your arms fall next to your side with your hands resting on your legs. ? If sitting in a chair, rest your feet flat on the floor. ? If sitting on a cushion, cross your legs in front of you.  Keep your head in a neutral position with your chin dropped slightly. Relax your jaw and rest the tip of your tongue on the roof of your mouth. Drop your gaze to the floor. You can close your eyes if you like.  Breathe normally and pay attention to your breath. Feel the air moving in and out of your nose. Feel your belly expanding and relaxing with each breath.  Your mind may wander, and that is okay. Make note of when your thoughts drift, and return your attention to your breath.  Avoid placing judgment on yourself, your feelings, or your surroundings. Make note of any judgment or feelings that come up, let them go, and bring your attention back to your breath.  When you are ready, lift your gaze or open your eyes. Pay attention to how your body feels after the meditation. Where to find more information You can find more information about MBSR from:  Your health care provider.  Community-based meditation centers or programs.  Programs offered near you. Summary  Mindfulness-based stress reduction (MBSR) is a program that teaches you how to intentionally pay attention to the present moment. It is used with other treatments to help you cope better with daily stress, emotions, and pain.  MBSR focuses on developing self-awareness, which allows you to respond to life stress without judgment or negative emotions.  MBSR programs may involve learning  different mindfulness practices, such as breathing exercises, meditation, yoga, body scan, or mindful eating. Find a mindfulness practice that works best for you, and set aside time for it on a regular basis. This information is not intended to replace advice given to you by your health care provider. Make sure you discuss any questions you have with your health care provider. Document Released: 10/29/2016 Document Revised: 06/04/2017 Document Reviewed: 10/29/2016  Elsevier Patient Education  [employer].

## 2019-07-04 ENCOUNTER — Ambulatory Visit: Payer: 59

## 2019-07-27 ENCOUNTER — Other Ambulatory Visit (HOSPITAL_COMMUNITY)
Admission: RE | Admit: 2019-07-27 | Discharge: 2019-07-27 | Disposition: A | Discharge status: Home / Self Care | Payer: 59 | Source: Ambulatory Visit | Attending: Obstetrics and Gynecology | Admitting: Obstetrics and Gynecology

## 2019-07-27 ENCOUNTER — Ambulatory Visit (INDEPENDENT_AMBULATORY_CARE_PROVIDER_SITE_OTHER): Payer: 59 | Admitting: Obstetrics and Gynecology

## 2019-07-27 ENCOUNTER — Encounter: Payer: Self-pay | Admitting: Obstetrics and Gynecology

## 2019-07-27 ENCOUNTER — Other Ambulatory Visit: Payer: Self-pay

## 2019-07-27 VITALS — BP 160/110 | Ht 66.0 in | Wt 339.0 lb

## 2019-07-27 DIAGNOSIS — R87810 Cervical high risk human papillomavirus (HPV) DNA test positive: Secondary | ICD-10-CM

## 2019-07-27 DIAGNOSIS — Z1151 Encounter for screening for human papillomavirus (HPV): Secondary | ICD-10-CM | POA: Diagnosis present

## 2019-07-27 DIAGNOSIS — R87610 Atypical squamous cells of undetermined significance on cytologic smear of cervix (ASC-US): Secondary | ICD-10-CM | POA: Diagnosis not present

## 2019-07-27 DIAGNOSIS — Z124 Encounter for screening for malignant neoplasm of cervix: Secondary | ICD-10-CM

## 2019-07-27 NOTE — Patient Instructions (Signed)
I value your feedback and entrusting us with your care. If you get a [facility] patient survey, I would appreciate you taking the time to let us know about your experience today. Thank you!  As of June 15, 2019, your lab results will be released to your MyChart immediately, before I even have a chance to see them. Please give me time to review them and contact you if there are any abnormalities. Thank you for your patience.  

## 2019-07-27 NOTE — Progress Notes (Signed)
McLean-Scocuzza, Pasty Spillers, MD   Chief Complaint  Patient presents with  . Referral    pap smear    HPI:      Ms. Shannon Clayton is a 44 y.o. No obstetric history on file. who LMP was Patient's last menstrual period was 07/11/2019 (approximate)., presents today for NP pap smear, referred by PCP. Had annual with her but just needs pap.  Last pap 05/2013 was ASCUS/pos HPV DNA. No pap since. Last mammo 12/20 was neg, repeat due in 12 months. No FH breast/ovar cancer.  Has monthly menses, no problems. She is sex active, s/p Essure. No dyspareunia.   Patient Active Problem List   Diagnosis Date Noted  . ASCUS with positive high risk HPV cervical 07/27/2019  . Elevated liver enzymes 06/23/2019  . Annual physical exam 06/23/2019  . Prediabetes 06/09/2019  . Vitamin D deficiency 06/09/2019  . Migraine with aura and without status migrainosus, not intractable 05/28/2019  . Essential hypertension 05/28/2019  . Anxiety and depression 05/28/2019  . Toenail bruise, left, sequela 05/28/2019  . Contraception management 05/10/2014  . Cough 05/10/2014  . Morbid obesity (HCC) 05/10/2014  . Irregular periods 05/10/2014    Past Surgical History:  Procedure Laterality Date  . CESAREAN SECTION  2006  . ear pinning  1984  . TONSILLECTOMY AND ADENOIDECTOMY  2011    Family History  Problem Relation Age of Onset  . Cancer Mother        MM  . Arthritis Mother   . Hyperlipidemia Mother   . Hypertension Mother   . Anxiety disorder Mother   . Arthritis Father   . Cancer Maternal Grandmother        lymphoma 2x  . Hypertension Maternal Grandmother   . Hypertension Maternal Grandfather   . Diabetes Maternal Grandfather   . Cancer Paternal Grandfather        skin cancer   . Breast cancer Neg Hx     Social History   Socioeconomic History  . Marital status: Legally Separated    Spouse name: Not on file  . Number of children: Not on file  . Years of education: Not on file  .  Highest education level: Not on file  Occupational History  . Not on file  Tobacco Use  . Smoking status: Never Smoker  . Smokeless tobacco: Never Used  Substance and Sexual Activity  . Alcohol use: Yes  . Drug use: No  . Sexual activity: Yes    Birth control/protection: None  Other Topics Concern  . Not on file  Social History Narrative   Single mom    25 y.o daughter    Never smoker    Social Determinants of Corporate investment banker Strain:   . Difficulty of Paying Living Expenses: Not on file  Food Insecurity:   . Worried About Programme researcher, broadcasting/film/video in the Last Year: Not on file  . Ran Out of Food in the Last Year: Not on file  Transportation Needs:   . Lack of Transportation (Medical): Not on file  . Lack of Transportation (Non-Medical): Not on file  Physical Activity:   . Days of Exercise per Week: Not on file  . Minutes of Exercise per Session: Not on file  Stress:   . Feeling of Stress : Not on file  Social Connections:   . Frequency of Communication with Friends and Family: Not on file  . Frequency of Social Gatherings with Friends and Family: Not  on file  . Attends Religious Services: Not on file  . Active Member of Clubs or Organizations: Not on file  . Attends Banker Meetings: Not on file  . Marital Status: Not on file  Intimate Partner Violence:   . Fear of Current or Ex-Partner: Not on file  . Emotionally Abused: Not on file  . Physically Abused: Not on file  . Sexually Abused: Not on file    Outpatient Medications Prior to Visit  Medication Sig Dispense Refill  . amLODipine (NORVASC) 2.5 MG tablet Take 1 tablet (2.5 mg total) by mouth daily. 90 tablet 3  . Cholecalciferol 1.25 MG (50000 UT) capsule Take 1 capsule (50,000 Units total) by mouth once a week. 13 capsule 1  . mupirocin ointment (BACTROBAN) 2 % Apply 1 application topically 2 (two) times daily. Hands 30 g 2  . Prenatal Vit-Fe Fumarate-FA (PNV PRENATAL PLUS MULTIVITAMIN) 27-1  MG TABS Take 1 tablet by mouth daily. 30 tablet 6  . venlafaxine XR (EFFEXOR XR) 37.5 MG 24 hr capsule Take 1 capsule (37.5 mg total) by mouth daily with breakfast. 90 capsule 0  . butalbital-acetaminophen-caffeine (FIORICET) 50-325-40 MG tablet Take 1-2 tablets by mouth daily as needed for headache. 30 tablet 0  . doxycycline (VIBRA-TABS) 100 MG tablet Take 1 tablet (100 mg total) by mouth 2 (two) times daily. With food 14 tablet 0   No facility-administered medications prior to visit.      ROS:  Review of Systems  Constitutional: Negative for fever.  Gastrointestinal: Negative for blood in stool, constipation, diarrhea, nausea and vomiting.  Genitourinary: Negative for dyspareunia, dysuria, flank pain, frequency, hematuria, urgency, vaginal bleeding, vaginal discharge and vaginal pain.  Musculoskeletal: Negative for back pain.  Skin: Negative for rash.  BREAST: No symptoms   OBJECTIVE:   Vitals:  BP (!) 160/110   Ht 5\' 6"  (1.676 m)   Wt (!) 339 lb (153.8 kg)   LMP 07/11/2019 (Approximate)   BMI 54.72 kg/m   Physical Exam Vitals reviewed.  Constitutional:      Appearance: She is well-developed.  Pulmonary:     Effort: Pulmonary effort is normal.  Genitourinary:    General: Normal vulva.     Pubic Area: No rash.      Labia:        Right: No rash, tenderness or lesion.        Left: No rash, tenderness or lesion.      Vagina: Normal. No vaginal discharge, erythema or tenderness.     Cervix: Normal.     Uterus: Normal. Not enlarged and not tender.      Adnexa: Right adnexa normal and left adnexa normal.       Right: No mass or tenderness.         Left: No mass or tenderness.    Musculoskeletal:        General: Normal range of motion.     Cervical back: Normal range of motion.  Skin:    General: Skin is warm and dry.  Neurological:     General: No focal deficit present.     Mental Status: She is alert and oriented to person, place, and time.  Psychiatric:         Mood and Affect: Mood normal.        Behavior: Behavior normal.        Thought Content: Thought content normal.        Judgment: Judgment normal.     Assessment/Plan:  Cervical cancer screening - Plan: Cytology - PAP; Pap today. Will f/u if abn.   Screening for HPV (human papillomavirus) - Plan: Cytology - PAP  ASCUS with positive high risk HPV cervical--in 2014.   Discussed recommendations for yearly breast, vaginal and pelvic exams, independent of pap smears.   Return in about 1 year (around 07/26/2020).  Fortino Haag B. Florette Thai, PA-C 07/27/2019 4:21 PM

## 2019-08-01 LAB — CYTOLOGY - PAP
Comment: NEGATIVE
Diagnosis: NEGATIVE
Diagnosis: REACTIVE
High risk HPV: NEGATIVE

## 2019-08-15 ENCOUNTER — Other Ambulatory Visit: Payer: Self-pay | Admitting: Internal Medicine

## 2019-08-15 DIAGNOSIS — F419 Anxiety disorder, unspecified: Secondary | ICD-10-CM

## 2019-08-15 DIAGNOSIS — F329 Major depressive disorder, single episode, unspecified: Secondary | ICD-10-CM

## 2019-08-15 MED ORDER — VENLAFAXINE HCL ER 37.5 MG PO CP24: 37.5000 mg | ORAL_CAPSULE | Freq: Every day | ORAL | 3 refills | Status: DC

## 2019-09-22 ENCOUNTER — Ambulatory Visit: Payer: 59 | Admitting: Internal Medicine

## 2019-09-22 DIAGNOSIS — Z0289 Encounter for other administrative examinations: Secondary | ICD-10-CM

## 2019-12-19 ENCOUNTER — Ambulatory Visit: Payer: 59 | Admitting: Podiatry

## 2020-01-01 ENCOUNTER — Ambulatory Visit: Payer: 59

## 2020-01-01 ENCOUNTER — Ambulatory Visit: Payer: 59 | Admitting: Podiatry

## 2020-01-01 ENCOUNTER — Telehealth: Payer: Self-pay | Admitting: Internal Medicine

## 2020-01-01 ENCOUNTER — Other Ambulatory Visit: Payer: Self-pay

## 2020-01-01 ENCOUNTER — Ambulatory Visit (INDEPENDENT_AMBULATORY_CARE_PROVIDER_SITE_OTHER): Payer: 59

## 2020-01-01 ENCOUNTER — Encounter: Payer: Self-pay | Admitting: Podiatry

## 2020-01-01 VITALS — BP 166/98 | HR 98 | Resp 20 | Ht 66.0 in | Wt 325.0 lb

## 2020-01-01 DIAGNOSIS — Q666 Other congenital valgus deformities of feet: Secondary | ICD-10-CM | POA: Diagnosis not present

## 2020-01-01 DIAGNOSIS — M7751 Other enthesopathy of right foot: Secondary | ICD-10-CM | POA: Diagnosis not present

## 2020-01-01 DIAGNOSIS — M779 Enthesopathy, unspecified: Secondary | ICD-10-CM

## 2020-01-01 DIAGNOSIS — R748 Abnormal levels of other serum enzymes: Secondary | ICD-10-CM

## 2020-01-01 DIAGNOSIS — M778 Other enthesopathies, not elsewhere classified: Secondary | ICD-10-CM | POA: Diagnosis not present

## 2020-01-01 DIAGNOSIS — R52 Pain, unspecified: Secondary | ICD-10-CM

## 2020-01-01 NOTE — Progress Notes (Signed)
Subjective:  Patient ID: Shannon Clayton, female    DOB: 1975-10-09,  MRN: [id number]  Chief Complaint  Patient presents with  . Foot Pain    "My feet hurt on top of my feet.  It aches after walking a long time.  It burns on my right foot.  They've bothered me for a year or so.  I haven't treated them other than icing them.  They're about the same, hasn't gotten worse."    44 y.o. female presents with the above complaint.  Patient presents with complaint of bilateral dorsal foot pain that has been going on for quite some time.  Patient states that it hurts after walking for long period of time.  Patient has been trying to exercise as well.  There are some burning as well as dull achy pain associated with it.  He has been going on for about a half a year.  She has not tried treating it she has not done anything else.  She denies any other acute complaints.  She would like to know if this could be treated she has not seen anyone else prior to seeing me.  She would like to discuss various treatment options.   Review of Systems: Negative except as noted in the HPI. Denies N/V/F/Ch.  Past Medical History:  Diagnosis Date  . Depression   . Frequent headaches   . Hypertension   . Migraine headache with aura     Current Outpatient Medications:  .  amLODipine (NORVASC) 2.5 MG tablet, Take 1 tablet (2.5 mg total) by mouth daily., Disp: 90 tablet, Rfl: 3 .  Cholecalciferol 1.25 MG (50000 UT) capsule, Take 1 capsule (50,000 Units total) by mouth once a week., Disp: 13 capsule, Rfl: 1 .  Prenatal Vit-Fe Fumarate-FA (PNV PRENATAL PLUS MULTIVITAMIN) 27-1 MG TABS, Take 1 tablet by mouth daily., Disp: 30 tablet, Rfl: 6 .  venlafaxine XR (EFFEXOR XR) 37.5 MG 24 hr capsule, Take 1 capsule (37.5 mg total) by mouth daily with breakfast., Disp: 90 capsule, Rfl: 3 .  mupirocin ointment (BACTROBAN) 2 %, Apply 1 application topically 2 (two) times daily. Hands (Patient not taking: Reported on 01/01/2020),  Disp: 30 g, Rfl: 2  Social History   Tobacco Use  Smoking Status Never Smoker  Smokeless Tobacco Never Used    Allergies  Allergen Reactions  . Sumatriptan Other (See Comments)    myalgias Myalgias; makes shoulders and neck hurt   Objective:   Vitals:   01/01/20 1429  BP: (!) 166/98  Pulse: 98  Resp: 20   Body mass index is 52.46 kg/m. Constitutional Well developed. Well nourished.  Vascular Dorsalis pedis pulses palpable bilaterally. Posterior tibial pulses palpable bilaterally. Capillary refill normal to all digits.  No cyanosis or clubbing noted. Pedal hair growth normal.  Neurologic Normal speech. Oriented to person, place, and time. Epicritic sensation to light touch grossly present bilaterally.  Dermatologic Nails well groomed and normal in appearance. No open wounds. No skin lesions.  Orthopedic:  Pain on palpation along the course of the extensor digitorum longus tendons bilaterally.  Both equal local in nature.  Pain with resisted dorsiflexion of the digits bilaterally.  No pain with resisted plantarflexion of the digits.  No pain at the metatarsophalangeal joints 1 through 5 with range of motion active and passive.  No pain at the tarsometatarsal joints.   Radiographs: Bilateral views of skeletally mature adult 3 views: No osteoarthritic changes noted.  No bony abnormalities identified.  No fractures noted.  Plantar heel spurring noted bilaterally.  There is decreasing calcaneal clinician angle increase in talar declination angle no break in the cyma line.  Mild elevatus present.  These findings are consistent replacement of valgus deformity Assessment:   1. Extensor tendinitis of foot   2. Pain   3. Pes planovalgus    Plan:  Patient was evaluated and treated and all questions answered.  Extensor tendinitis bilateral with underlying pes planovalgus deformity -I explained to the patient the etiology of extensor tendinitis and various treatment options were  extensively discussed.  I believe patient will benefit from shoe gear modification as well as orthotics management.  Given that her pain is only worsens when she has been very active with walking I believe she will benefit from shoe gear changes to new balance with orthotics. -She will be scheduled with [facility] for custom-made orthotics to help control the hindfoot motion and support the arches of the heel and therefore taken the stress off the extensor tendon. -If there is no improvement will consider complete immobilization with a cam boot versus steroid injection.  No follow-ups on file.

## 2020-01-01 NOTE — Telephone Encounter (Signed)
Please set up in person f/u HTN   Thanks MTS

## 2020-01-10 ENCOUNTER — Telehealth: Payer: Self-pay | Admitting: Internal Medicine

## 2020-01-10 ENCOUNTER — Ambulatory Visit: Payer: 59 | Admitting: Internal Medicine

## 2020-01-10 NOTE — Telephone Encounter (Signed)
Patient no-showed today's appointment; appointment was for 01/10/20 at 3:30 pm, provider notified for review of record. Mychart sent to reschedule.

## 2020-01-24 ENCOUNTER — Other Ambulatory Visit: Payer: 59 | Admitting: Orthotics

## 2020-05-07 ENCOUNTER — Encounter: Payer: Self-pay | Admitting: Internal Medicine

## 2020-05-07 ENCOUNTER — Other Ambulatory Visit: Payer: Self-pay

## 2020-05-07 ENCOUNTER — Ambulatory Visit: Payer: 59 | Admitting: Internal Medicine

## 2020-05-07 VITALS — BP 138/82 | HR 94 | Temp 98.4°F | Ht 66.0 in | Wt 338.4 lb

## 2020-05-07 DIAGNOSIS — G43109 Migraine with aura, not intractable, without status migrainosus: Secondary | ICD-10-CM

## 2020-05-07 DIAGNOSIS — Z1389 Encounter for screening for other disorder: Secondary | ICD-10-CM

## 2020-05-07 DIAGNOSIS — R519 Headache, unspecified: Secondary | ICD-10-CM

## 2020-05-07 DIAGNOSIS — Z1329 Encounter for screening for other suspected endocrine disorder: Secondary | ICD-10-CM

## 2020-05-07 DIAGNOSIS — Z13818 Encounter for screening for other digestive system disorders: Secondary | ICD-10-CM

## 2020-05-07 DIAGNOSIS — N6320 Unspecified lump in the left breast, unspecified quadrant: Secondary | ICD-10-CM

## 2020-05-07 DIAGNOSIS — R7303 Prediabetes: Secondary | ICD-10-CM

## 2020-05-07 DIAGNOSIS — R748 Abnormal levels of other serum enzymes: Secondary | ICD-10-CM

## 2020-05-07 DIAGNOSIS — E559 Vitamin D deficiency, unspecified: Secondary | ICD-10-CM

## 2020-05-07 DIAGNOSIS — I10 Essential (primary) hypertension: Secondary | ICD-10-CM | POA: Diagnosis not present

## 2020-05-07 DIAGNOSIS — Z113 Encounter for screening for infections with a predominantly sexual mode of transmission: Secondary | ICD-10-CM

## 2020-05-07 DIAGNOSIS — G8929 Other chronic pain: Secondary | ICD-10-CM

## 2020-05-07 MED ORDER — BUTALBITAL-APAP-CAFFEINE 50-325-40 MG PO TABS: 1.0000 | ORAL_TABLET | Freq: Every day | ORAL | 5 refills | Status: DC | PRN

## 2020-05-07 MED ORDER — AMLODIPINE BESYLATE 2.5 MG PO TABS: 2.5000 mg | ORAL_TABLET | Freq: Every day | ORAL | 3 refills | Status: DC

## 2020-05-07 NOTE — Patient Instructions (Addendum)
[employer] blood pressure cuff    Hypertension, Adult High blood pressure (hypertension) is when the force of blood pumping through the arteries is too strong. The arteries are the blood vessels that carry blood from the heart throughout the body. Hypertension forces the heart to work harder to pump blood and may cause arteries to become narrow or stiff. Untreated or uncontrolled hypertension can cause a heart attack, heart failure, a stroke, kidney disease, and other problems. A blood pressure reading consists of a higher number over a lower number. Ideally, your blood pressure should be below 120/80. The first ("top") number is called the systolic pressure. It is a measure of the pressure in your arteries as your heart beats. The second ("bottom") number is called the diastolic pressure. It is a measure of the pressure in your arteries as the heart relaxes. What are the causes? The exact cause of this condition is not known. There are some conditions that result in or are related to high blood pressure. What increases the risk? Some risk factors for high blood pressure are under your control. The following factors may make you more likely to develop this condition:  Smoking.  Having type 2 diabetes mellitus, high cholesterol, or both.  Not getting enough exercise or physical activity.  Being overweight.  Having too much fat, sugar, calories, or salt (sodium) in your diet.  Drinking too much alcohol. Some risk factors for high blood pressure may be difficult or impossible to change. Some of these factors include:  Having chronic kidney disease.  Having a family history of high blood pressure.  Age. Risk increases with age.  Race. You may be at higher risk if you are African American.  Gender. Men are at higher risk than women before age 13. After age 49, women are at higher risk than men.  Having obstructive sleep apnea.  Stress. What are the signs or symptoms? High blood pressure  may not cause symptoms. Very high blood pressure (hypertensive crisis) may cause:  Headache.  Anxiety.  Shortness of breath.  Nosebleed.  Nausea and vomiting.  Vision changes.  Severe chest pain.  Seizures. How is this diagnosed? This condition is diagnosed by measuring your blood pressure while you are seated, with your arm resting on a flat surface, your legs uncrossed, and your feet flat on the floor. The cuff of the blood pressure monitor will be placed directly against the skin of your upper arm at the level of your heart. It should be measured at least twice using the same arm. Certain conditions can cause a difference in blood pressure between your right and left arms. Certain factors can cause blood pressure readings to be lower or higher than normal for a short period of time:  When your blood pressure is higher when you are in a health care provider's office than when you are at home, this is called white coat hypertension. Most people with this condition do not need medicines.  When your blood pressure is higher at home than when you are in a health care provider's office, this is called masked hypertension. Most people with this condition may need medicines to control blood pressure. If you have a high blood pressure reading during one visit or you have normal blood pressure with other risk factors, you may be asked to:  Return on a different day to have your blood pressure checked again.  Monitor your blood pressure at home for 1 week or longer. If you are diagnosed with  hypertension, you may have other blood or imaging tests to help your health care provider understand your overall risk for other conditions. How is this treated? This condition is treated by making healthy lifestyle changes, such as eating healthy foods, exercising more, and reducing your alcohol intake. Your health care provider may prescribe medicine if lifestyle changes are not enough to get your blood  pressure under control, and if:  Your systolic blood pressure is above 130.  Your diastolic blood pressure is above 80. Your personal target blood pressure may vary depending on your medical conditions, your age, and other factors. Follow these instructions at home: Eating and drinking   Eat a diet that is high in fiber and potassium, and low in sodium, added sugar, and fat. An example eating plan is called the DASH (Dietary Approaches to Stop Hypertension) diet. To eat this way: ? Eat plenty of fresh fruits and vegetables. Try to fill one half of your plate at each meal with fruits and vegetables. ? Eat whole grains, such as whole-wheat pasta, brown rice, or whole-grain bread. Fill about one fourth of your plate with whole grains. ? Eat or drink low-fat dairy products, such as skim milk or low-fat yogurt. ? Avoid fatty cuts of meat, processed or cured meats, and poultry with skin. Fill about one fourth of your plate with lean proteins, such as fish, chicken without skin, beans, eggs, or tofu. ? Avoid pre-made and processed foods. These tend to be higher in sodium, added sugar, and fat.  Reduce your daily sodium intake. Most people with hypertension should eat less than 1,500 mg of sodium a day.  Do not drink alcohol if: ? Your health care provider tells you not to drink. ? You are pregnant, may be pregnant, or are planning to become pregnant.  If you drink alcohol: ? Limit how much you use to:  0-1 drink a day for women.  0-2 drinks a day for men. ? Be aware of how much alcohol is in your drink. In the [place] one drink equals one 12 oz bottle of beer (355 mL), one 5 oz glass of wine (148 mL), or one 1 oz glass of hard liquor (44 mL). Lifestyle   Work with your health care provider to maintain a healthy body weight or to lose weight. Ask what an ideal weight is for you.  Get at least 30 minutes of exercise most days of the week. Activities may include walking, swimming, or  biking.  Include exercise to strengthen your muscles (resistance exercise), such as Pilates or lifting weights, as part of your weekly exercise routine. Try to do these types of exercises for 30 minutes at least 3 days a week.  Do not use any products that contain nicotine or tobacco, such as cigarettes, e-cigarettes, and chewing tobacco. If you need help quitting, ask your health care provider.  Monitor your blood pressure at home as told by your health care provider.  Keep all follow-up visits as told by your health care provider. This is important. Medicines  Take over-the-counter and prescription medicines only as told by your health care provider. Follow directions carefully. Blood pressure medicines must be taken as prescribed.  Do not skip doses of blood pressure medicine. Doing this puts you at risk for problems and can make the medicine less effective.  Ask your health care provider about side effects or reactions to medicines that you should watch for. Contact a health care provider if you:  Think you are  having a reaction to a medicine you are taking.  Have headaches that keep coming back (recurring).  Feel dizzy.  Have swelling in your ankles.  Have trouble with your vision. Get help right away if you:  Develop a severe headache or confusion.  Have unusual weakness or numbness.  Feel faint.  Have severe pain in your chest or abdomen.  Vomit repeatedly.  Have trouble breathing. Summary  Hypertension is when the force of blood pumping through your arteries is too strong. If this condition is not controlled, it may put you at risk for serious complications.  Your personal target blood pressure may vary depending on your medical conditions, your age, and other factors. For most people, a normal blood pressure is less than 120/80.  Hypertension is treated with lifestyle changes, medicines, or a combination of both. Lifestyle changes include losing weight, eating a  healthy, low-sodium diet, exercising more, and limiting alcohol. This information is not intended to replace advice given to you by your health care provider. Make sure you discuss any questions you have with your health care provider. Document Revised: 03/02/2018 Document Reviewed: 03/02/2018 Elsevier Patient Education  2020 [employer]  DASH Eating Plan DASH stands for "Dietary Approaches to Stop Hypertension." The DASH eating plan is a healthy eating plan that has been shown to reduce high blood pressure (hypertension). It may also reduce your risk for type 2 diabetes, heart disease, and stroke. The DASH eating plan may also help with weight loss. What are tips for following this plan?  General guidelines  Avoid eating more than 2,300 mg (milligrams) of salt (sodium) a day. If you have hypertension, you may need to reduce your sodium intake to 1,500 mg a day.  Limit alcohol intake to no more than 1 drink a day for nonpregnant women and 2 drinks a day for men. One drink equals 12 oz of beer, 5 oz of wine, or 1 oz of hard liquor.  Work with your health care provider to maintain a healthy body weight or to lose weight. Ask what an ideal weight is for you.  Get at least 30 minutes of exercise that causes your heart to beat faster (aerobic exercise) most days of the week. Activities may include walking, swimming, or biking.  Work with your health care provider or diet and nutrition specialist (dietitian) to adjust your eating plan to your individual calorie needs. Reading food labels   Check food labels for the amount of sodium per serving. Choose foods with less than 5 percent of the Daily Value of sodium. Generally, foods with less than 300 mg of sodium per serving fit into this eating plan.  To find whole grains, look for the word "whole" as the first word in the ingredient list. Shopping  Buy products labeled as "low-sodium" or "no salt added."  Buy fresh foods. Avoid canned foods  and premade or frozen meals. Cooking  Avoid adding salt when cooking. Use salt-free seasonings or herbs instead of table salt or sea salt. Check with your health care provider or pharmacist before using salt substitutes.  Do not fry foods. Cook foods using healthy methods such as baking, boiling, grilling, and broiling instead.  Cook with heart-healthy oils, such as olive, canola, soybean, or sunflower oil. Meal planning  Eat a balanced diet that includes: ? 5 or more servings of fruits and vegetables each day. At each meal, try to fill half of your plate with fruits and vegetables. ? Up to 6-8 servings of  whole grains each day. ? Less than 6 oz of lean meat, poultry, or fish each day. A 3-oz serving of meat is about the same size as a deck of cards. One egg equals 1 oz. ? 2 servings of low-fat dairy each day. ? A serving of nuts, seeds, or beans 5 times each week. ? Heart-healthy fats. Healthy fats called Omega-3 fatty acids are found in foods such as flaxseeds and coldwater fish, like sardines, salmon, and mackerel.  Limit how much you eat of the following: ? Canned or prepackaged foods. ? Food that is high in trans fat, such as fried foods. ? Food that is high in saturated fat, such as fatty meat. ? Sweets, desserts, sugary drinks, and other foods with added sugar. ? Full-fat dairy products.  Do not salt foods before eating.  Try to eat at least 2 vegetarian meals each week.  Eat more home-cooked food and less restaurant, buffet, and fast food.  When eating at a restaurant, ask that your food be prepared with less salt or no salt, if possible. What foods are recommended? The items listed may not be a complete list. Talk with your dietitian about what dietary choices are best for you. Grains Whole-grain or whole-wheat bread. Whole-grain or whole-wheat pasta. Brown rice. Orpah Cobb. Bulgur. Whole-grain and low-sodium cereals. Pita bread. Low-fat, low-sodium crackers.  Whole-wheat flour tortillas. Vegetables Fresh or frozen vegetables (raw, steamed, roasted, or grilled). Low-sodium or reduced-sodium tomato and vegetable juice. Low-sodium or reduced-sodium tomato sauce and tomato paste. Low-sodium or reduced-sodium canned vegetables. Fruits All fresh, dried, or frozen fruit. Canned fruit in natural juice (without added sugar). Meat and other protein foods Skinless chicken or [place]. Ground chicken or [place]. Pork with fat trimmed off. Fish and seafood. Egg whites. Dried beans, peas, or lentils. Unsalted nuts, nut butters, and seeds. Unsalted canned beans. Lean cuts of beef with fat trimmed off. Low-sodium, lean deli meat. Dairy Low-fat (1%) or fat-free (skim) milk. Fat-free, low-fat, or reduced-fat cheeses. Nonfat, low-sodium ricotta or cottage cheese. Low-fat or nonfat yogurt. Low-fat, low-sodium cheese. Fats and oils Soft margarine without trans fats. Vegetable oil. Low-fat, reduced-fat, or light mayonnaise and salad dressings (reduced-sodium). Canola, safflower, olive, soybean, and sunflower oils. Avocado. Seasoning and other foods Herbs. Spices. Seasoning mixes without salt. Unsalted popcorn and pretzels. Fat-free sweets. What foods are not recommended? The items listed may not be a complete list. Talk with your dietitian about what dietary choices are best for you. Grains Baked goods made with fat, such as croissants, muffins, or some breads. Dry pasta or rice meal packs. Vegetables Creamed or fried vegetables. Vegetables in a cheese sauce. Regular canned vegetables (not low-sodium or reduced-sodium). Regular canned tomato sauce and paste (not low-sodium or reduced-sodium). Regular tomato and vegetable juice (not low-sodium or reduced-sodium). Rosita Fire. Olives. Fruits Canned fruit in a light or heavy syrup. Fried fruit. Fruit in cream or butter sauce. Meat and other protein foods Fatty cuts of meat. Ribs. Fried meat. Tomasa Blase. Sausage. Bologna and other  processed lunch meats. Salami. Fatback. Hotdogs. Bratwurst. Salted nuts and seeds. Canned beans with added salt. Canned or smoked fish. Whole eggs or egg yolks. Chicken or [place] with skin. Dairy Whole or 2% milk, cream, and half-and-half. Whole or full-fat cream cheese. Whole-fat or sweetened yogurt. Full-fat cheese. Nondairy creamers. Whipped toppings. Processed cheese and cheese spreads. Fats and oils Butter. Stick margarine. Lard. Shortening. Ghee. Bacon fat. Tropical oils, such as coconut, palm kernel, or palm oil. Seasoning and other foods Salted  popcorn and pretzels. Onion salt, garlic salt, seasoned salt, table salt, and sea salt. Worcestershire sauce. Tartar sauce. Barbecue sauce. Teriyaki sauce. Soy sauce, including reduced-sodium. Steak sauce. Canned and packaged gravies. Fish sauce. Oyster sauce. Cocktail sauce. Horseradish that you find on the shelf. Ketchup. Mustard. Meat flavorings and tenderizers. Bouillon cubes. Hot sauce and Tabasco sauce. Premade or packaged marinades. Premade or packaged taco seasonings. Relishes. Regular salad dressings. Where to find more information:  National Heart, Lung, and Blood Institute: PopSteam.is  American Heart Association: www.heart.org Summary  The DASH eating plan is a healthy eating plan that has been shown to reduce high blood pressure (hypertension). It may also reduce your risk for type 2 diabetes, heart disease, and stroke.  With the DASH eating plan, you should limit salt (sodium) intake to 2,300 mg a day. If you have hypertension, you may need to reduce your sodium intake to 1,500 mg a day.  When on the DASH eating plan, aim to eat more fresh fruits and vegetables, whole grains, lean proteins, low-fat dairy, and heart-healthy fats.  Work with your health care provider or diet and nutrition specialist (dietitian) to adjust your eating plan to your individual calorie needs. This information is not intended to replace advice given to  you by your health care provider. Make sure you discuss any questions you have with your health care provider. Document Revised: 06/04/2017 Document Reviewed: 06/15/2016 Elsevier Patient Education  2020 [employer]. Consider HPV x 3 doses

## 2020-05-07 NOTE — Progress Notes (Signed)
Chief Complaint  Patient presents with  . Breast Problem    lump on the left breast    F/u with boyfriend Doug 1. C/o left inner breast mass feels like "BB" noted x 2 weeks and not painful  2. HTN on norvasc 2.5 mg qd  3. C/o daily stress h/a's and at times migraines but less frequent. She tries Ibuprofen for h/a and did not pick up fiorocet but wants to try again unable to tolerate imitrex in the past  Denies snoring at night and stopping breathing    Review of Systems  Constitutional: Negative for weight loss.  HENT: Negative for hearing loss.   Eyes: Negative for blurred vision.  Respiratory: Negative for shortness of breath.   Cardiovascular: Negative for chest pain.  Gastrointestinal: Negative for abdominal pain.  Genitourinary:       +left breast mass   Musculoskeletal: Negative for falls and joint pain.  Skin: Negative for rash.  Neurological: Positive for headaches.  Psychiatric/Behavioral: Negative for depression.   Past Medical History:  Diagnosis Date  . Depression   . Frequent headaches   . Hypertension   . Migraine headache with aura    Past Surgical History:  Procedure Laterality Date  . CESAREAN SECTION  2006  . ear pinning  1984  . TONSILLECTOMY AND ADENOIDECTOMY  2011   Family History  Problem Relation Age of Onset  . Cancer Mother        MM  . Arthritis Mother   . Hyperlipidemia Mother   . Hypertension Mother   . Anxiety disorder Mother   . Arthritis Father   . Cancer Maternal Grandmother        lymphoma 2x  . Hypertension Maternal Grandmother   . Hypertension Maternal Grandfather   . Diabetes Maternal Grandfather   . Cancer Paternal Grandfather        skin cancer   . Breast cancer Neg Hx    Social History   Socioeconomic History  . Marital status: Legally Separated    Spouse name: Not on file  . Number of children: Not on file  . Years of education: Not on file  . Highest education level: Not on file  Occupational History  . Not on  file  Tobacco Use  . Smoking status: Never Smoker  . Smokeless tobacco: Never Used  Vaping Use  . Vaping Use: Never used  Substance and Sexual Activity  . Alcohol use: Yes  . Drug use: No  . Sexual activity: Yes    Birth control/protection: None  Other Topics Concern  . Not on file  Social History Narrative   Single mom    Boyfriend Gala Romney   Has a daughter    Never smoker    Social Determinants of Corporate investment banker Strain:   . Difficulty of Paying Living Expenses: Not on file  Food Insecurity:   . Worried About Programme researcher, broadcasting/film/video in the Last Year: Not on file  . Ran Out of Food in the Last Year: Not on file  Transportation Needs:   . Lack of Transportation (Medical): Not on file  . Lack of Transportation (Non-Medical): Not on file  Physical Activity:   . Days of Exercise per Week: Not on file  . Minutes of Exercise per Session: Not on file  Stress:   . Feeling of Stress : Not on file  Social Connections:   . Frequency of Communication with Friends and Family: Not on file  . Frequency  of Social Gatherings with Friends and Family: Not on file  . Attends Religious Services: Not on file  . Active Member of Clubs or Organizations: Not on file  . Attends Banker Meetings: Not on file  . Marital Status: Not on file  Intimate Partner Violence:   . Fear of Current or Ex-Partner: Not on file  . Emotionally Abused: Not on file  . Physically Abused: Not on file  . Sexually Abused: Not on file   Current Meds  Medication Sig  . amLODipine (NORVASC) 2.5 MG tablet Take 1 tablet (2.5 mg total) by mouth daily.  . Cholecalciferol 1.25 MG (50000 UT) capsule Take 1 capsule (50,000 Units total) by mouth once a week.  . mupirocin ointment (BACTROBAN) 2 % Apply 1 application topically 2 (two) times daily. Hands  . Prenatal Vit-Fe Fumarate-FA (PNV PRENATAL PLUS MULTIVITAMIN) 27-1 MG TABS Take 1 tablet by mouth daily.  Marland Kitchen venlafaxine XR (EFFEXOR XR) 37.5 MG 24 hr  capsule Take 1 capsule (37.5 mg total) by mouth daily with breakfast.  . [DISCONTINUED] amLODipine (NORVASC) 2.5 MG tablet Take 1 tablet (2.5 mg total) by mouth daily.  . [DISCONTINUED] amLODipine (NORVASC) 2.5 MG tablet Take 1 tablet (2.5 mg total) by mouth daily.   Allergies  Allergen Reactions  . Sumatriptan Other (See Comments)    myalgias Myalgias; makes shoulders and neck hurt   No results found for this or any previous visit (from the past 2160 hour(s)). Objective  Body mass index is 54.62 kg/m. Wt Readings from Last 3 Encounters:  05/07/20 (!) 338 lb 6.4 oz (153.5 kg)  01/01/20 (!) 325 lb (147.4 kg)  07/27/19 (!) 339 lb (153.8 kg)   Temp Readings from Last 3 Encounters:  05/07/20 98.4 F (36.9 C) (Oral)  05/26/19 97.9 F (36.6 C) (Temporal)  07/22/15 98 F (36.7 C) (Oral)   BP Readings from Last 3 Encounters:  05/07/20 138/82  01/01/20 (!) 166/98  07/27/19 (!) 160/110   Pulse Readings from Last 3 Encounters:  05/07/20 94  01/01/20 98  05/26/19 92    Physical Exam Vitals and nursing note reviewed.  Constitutional:      Appearance: Normal appearance. She is well-developed and well-groomed. She is obese.  HENT:     Head: Normocephalic and atraumatic.  Eyes:     Conjunctiva/sclera: Conjunctivae normal.     Pupils: Pupils are equal, round, and reactive to light.  Cardiovascular:     Rate and Rhythm: Normal rate and regular rhythm.     Heart sounds: Normal heart sounds. No murmur heard.   Pulmonary:     Effort: Pulmonary effort is normal.     Breath sounds: Normal breath sounds.  Chest:     Breasts:        Left: Mass present.    Abdominal:     Tenderness: There is no abdominal tenderness.  Skin:    General: Skin is warm and dry.  Neurological:     General: No focal deficit present.     Mental Status: She is alert and oriented to person, place, and time. Mental status is at baseline.     Gait: Gait normal.  Psychiatric:        Attention and  Perception: Attention and perception normal.        Mood and Affect: Mood and affect normal.        Speech: Speech normal.        Behavior: Behavior normal. Behavior is cooperative.  Thought Content: Thought content normal.        Cognition and Memory: Cognition and memory normal.        Judgment: Judgment normal.     Assessment  Plan  Left breast mass - Plan: MM DIAG BREAST TOMO BILATERAL, US BREAST LTD UNI LEFT INC AXILLA  Migraine with aura and without status migrainosus, not intractable with chronic h/a - Plan: butalbital-acetaminophen-caffeine (FIORICET) 50-325-40 MG tablet Prn Tylenol   Essential hypertension - Plan: amLODipine (NORVASC) 2.5 MG tablet, Lipid panel, Comprehensive metabolic panel, CBC with Differential/Platelet,  LOG BP and get BP cuff amazon  Prediabetes - Plan: Hemoglobin A1c  Vitamin D deficiency - Plan: Vitamin D (25 hydroxy)   HM-CPE at f/u Flu shot 04/12/19  Tdaputd  Consider HPV vaccine with ob/gyn if has not had  [employer] x 3 logged today 09/2019, 10/2019, and 04/2020  Mammogram had 06/22/2019 pending result -ordered mammogram [place] [facility] 07/27/19 normal negative HPV Consider colonoscopy age 87  Skin no current issues I.e moles/skin cancer -FH skin cancer MM consider derm in the future  Provider: Dr. French Ana McLean-Scocuzza-[facility]

## 2020-05-07 NOTE — Progress Notes (Signed)
Noticed lump on the left breast a few weeks ago. It has not grown, moved, painful, or discolored.  No known injury or previous breast problems.

## 2020-05-09 ENCOUNTER — Ambulatory Visit
Admission: RE | Admit: 2020-05-09 | Discharge: 2020-05-09 | Disposition: A | Discharge status: Home / Self Care | Payer: 59 | Source: Ambulatory Visit | Attending: Internal Medicine | Admitting: Internal Medicine

## 2020-05-09 ENCOUNTER — Other Ambulatory Visit: Payer: Self-pay

## 2020-05-09 ENCOUNTER — Other Ambulatory Visit: Payer: Self-pay | Admitting: Internal Medicine

## 2020-05-09 DIAGNOSIS — N6320 Unspecified lump in the left breast, unspecified quadrant: Secondary | ICD-10-CM | POA: Diagnosis present

## 2020-05-21 ENCOUNTER — Other Ambulatory Visit: Payer: 59

## 2020-06-11 ENCOUNTER — Other Ambulatory Visit: Payer: 59

## 2020-07-17 ENCOUNTER — Other Ambulatory Visit: Payer: 59

## 2020-08-07 ENCOUNTER — Ambulatory Visit: Payer: 59 | Admitting: Internal Medicine

## 2020-08-07 ENCOUNTER — Telehealth: Payer: Self-pay | Admitting: Internal Medicine

## 2020-08-07 DIAGNOSIS — Z0289 Encounter for other administrative examinations: Secondary | ICD-10-CM

## 2020-08-07 NOTE — Telephone Encounter (Signed)
Patient no-showed today's appointment; appointment was for 08/07/20 at 3:00 pm, provider notified for review of record.  Letter sent to reschedule.

## 2020-08-21 ENCOUNTER — Other Ambulatory Visit: Payer: Self-pay | Admitting: Internal Medicine

## 2020-08-21 DIAGNOSIS — F419 Anxiety disorder, unspecified: Secondary | ICD-10-CM

## 2020-09-05 ENCOUNTER — Other Ambulatory Visit: Payer: Self-pay

## 2020-09-05 ENCOUNTER — Ambulatory Visit
Admission: RE | Admit: 2020-09-05 | Discharge: 2020-09-05 | Disposition: A | Discharge status: Home / Self Care | Payer: 59 | Source: Ambulatory Visit | Attending: Family Medicine | Admitting: Family Medicine

## 2020-09-05 VITALS — BP 181/99 | HR 102 | Temp 99.1°F | Resp 20

## 2020-09-05 DIAGNOSIS — M62838 Other muscle spasm: Secondary | ICD-10-CM

## 2020-09-05 DIAGNOSIS — M546 Pain in thoracic spine: Secondary | ICD-10-CM

## 2020-09-05 DIAGNOSIS — M7989 Other specified soft tissue disorders: Secondary | ICD-10-CM | POA: Diagnosis not present

## 2020-09-05 LAB — POCT URINALYSIS DIP (MANUAL ENTRY)
Bilirubin, UA: NEGATIVE
Glucose, UA: NEGATIVE mg/dL
Ketones, POC UA: NEGATIVE mg/dL
Leukocytes, UA: NEGATIVE
Nitrite, UA: NEGATIVE
Protein Ur, POC: NEGATIVE mg/dL
Spec Grav, UA: 1.025 (ref 1.010–1.025)
Urobilinogen, UA: 0.2 E.U./dL
pH, UA: 7.5 (ref 5.0–8.0)

## 2020-09-05 MED ORDER — CYCLOBENZAPRINE HCL 5 MG PO TABS: 5.0000 mg | ORAL_TABLET | Freq: Three times a day (TID) | ORAL | 0 refills | Status: DC | PRN

## 2020-09-05 MED ORDER — KETOROLAC TROMETHAMINE 30 MG/ML IJ SOLN
30.0000 mg | Freq: Once | INTRAMUSCULAR | Status: AC
  Administered 2020-09-05: 30 mg via INTRAMUSCULAR

## 2020-09-05 NOTE — ED Provider Notes (Signed)
[name]    CSN: [id number] Arrival date & time: 09/05/20  1156      History   Chief Complaint Chief Complaint  Patient presents with  . Back Pain    HPI Shannon Clayton is a 45 y.o. female.   Pt is a 45 year old female that presents with back pain original site right upper back/scapular region, radiating to lower right back for approx 2 weeks. Also reports HA to right side of head, dull aching in nature. Has tried Salonpass, muscle vibration therapy, CBD gummies w/o improvement to back pain. Denies rash, CP, nausea, SOB, dizziness, dysuria symptoms, known injury.  Last took ibuprofen yesterday.     Past Medical History:  Diagnosis Date  . Depression   . Frequent headaches   . Hypertension   . Migraine headache with aura     Patient Active Problem List   Diagnosis Date Noted  . ASCUS with positive high risk HPV cervical 07/27/2019  . Elevated liver enzymes 06/23/2019  . Annual physical exam 06/23/2019  . Prediabetes 06/09/2019  . Vitamin D deficiency 06/09/2019  . Migraine with aura and without status migrainosus, not intractable 05/28/2019  . Essential hypertension 05/28/2019  . Anxiety and depression 05/28/2019  . Toenail bruise, left, sequela 05/28/2019  . Contraception management 05/10/2014  . Cough 05/10/2014  . Morbid obesity (HCC) 05/10/2014  . Irregular periods 05/10/2014    Past Surgical History:  Procedure Laterality Date  . CESAREAN SECTION  2006  . ear pinning  1984  . TONSILLECTOMY AND ADENOIDECTOMY  2011    OB History    Gravida  1   Para  1   Term  1   Preterm      AB      Living  1     SAB      IAB      Ectopic      Multiple      Live Births  1            Home Medications    Prior to Admission medications   Medication Sig Start Date End Date Taking? Authorizing Provider  amLODipine (NORVASC) 2.5 MG tablet Take 1 tablet (2.5 mg total) by mouth daily. 05/07/20  Yes McLean-Scocuzza, Pasty Spillers, MD   butalbital-acetaminophen-caffeine (FIORICET) 50-325-40 MG tablet Take 1-2 tablets by mouth daily as needed for headache. Limit use no more 2x per week 05/07/20 05/07/21 Yes McLean-Scocuzza, Pasty Spillers, MD  cyclobenzaprine (FLEXERIL) 5 MG tablet Take 1 tablet (5 mg total) by mouth 3 (three) times daily as needed for muscle spasms. 09/05/20  Yes Alexzandrea Normington A, NP  Prenatal Vit-Fe Fumarate-FA (PNV PRENATAL PLUS MULTIVITAMIN) 27-1 MG TABS Take 1 tablet by mouth daily. 05/21/15  Yes Dianne Dun, MD  venlafaxine XR (EFFEXOR-XR) 37.5 MG 24 hr capsule TAKE 1 CAPSULE BY MOUTH DAILY WITH BREAKFAST. 08/21/20  Yes McLean-Scocuzza, Pasty Spillers, MD  Cholecalciferol 1.25 MG (50000 UT) capsule Take 1 capsule (50,000 Units total) by mouth once a week. 06/09/19   McLean-Scocuzza, Pasty Spillers, MD  mupirocin ointment (BACTROBAN) 2 % Apply 1 application topically 2 (two) times daily. Hands 05/26/19   McLean-Scocuzza, Pasty Spillers, MD    Family History Family History  Problem Relation Age of Onset  . Cancer Mother        MM  . Arthritis Mother   . Hyperlipidemia Mother   . Hypertension Mother   . Anxiety disorder Mother   . Arthritis Father   .  Cancer Maternal Grandmother        lymphoma 2x  . Hypertension Maternal Grandmother   . Hypertension Maternal Grandfather   . Diabetes Maternal Grandfather   . Cancer Paternal Grandfather        skin cancer   . Breast cancer Neg Hx     Social History Social History   Tobacco Use  . Smoking status: Never Smoker  . Smokeless tobacco: Never Used  Vaping Use  . Vaping Use: Never used  Substance Use Topics  . Alcohol use: Yes  . Drug use: No     Allergies   Sumatriptan   Review of Systems Review of Systems   Physical Exam Triage Vital Signs ED Triage Vitals  Enc Vitals Group     BP 09/05/20 1215 (!) 181/99     Pulse Rate 09/05/20 1215 (!) 102     Resp 09/05/20 1215 20     Temp 09/05/20 1215 99.1 F (37.3 C)     Temp Source 09/05/20 1215 Oral     SpO2 09/05/20  1215 97 %     Weight --      Height --      Head Circumference --      Peak Flow --      Pain Score 09/05/20 1211 7     Pain Loc --      Pain Edu? --      Excl. in GC? --    No data found.  Updated Vital Signs BP (!) 181/99 (BP Location: Left Wrist)   Pulse (!) 102   Temp 99.1 F (37.3 C) (Oral)   Resp 20   LMP 09/01/2020   SpO2 97%   Visual Acuity Right Eye Distance:   Left Eye Distance:   Bilateral Distance:    Right Eye Near:   Left Eye Near:    Bilateral Near:     Physical Exam Vitals and nursing note reviewed.  Constitutional:      General: She is not in acute distress.    Appearance: Normal appearance. She is not ill-appearing, toxic-appearing or diaphoretic.  HENT:     Head: Normocephalic.  Eyes:     Conjunctiva/sclera: Conjunctivae normal.  Pulmonary:     Effort: Pulmonary effort is normal.  Musculoskeletal:        General: Normal range of motion.     Cervical back: Normal range of motion.       Back:     Comments: Swelling, tenderness No spinal tenderness.   Skin:    General: Skin is warm and dry.     Findings: No rash.  Neurological:     Mental Status: She is alert.  Psychiatric:        Mood and Affect: Mood normal.      UC Treatments / Results  Labs (all labs ordered are listed, but only abnormal results are displayed) Labs Reviewed  POCT URINALYSIS DIP (MANUAL ENTRY) - Abnormal; Notable for the following components:      Result Value   Blood, UA moderate (*)    All other components within normal limits    EKG   Radiology No results found.  Procedures Procedures (including critical care time)  Medications Ordered in UC Medications  ketorolac (TORADOL) 30 MG/ML injection 30 mg (30 mg Intramuscular Given 09/05/20 1249)    Initial Impression / Assessment and Plan / UC Course  I have reviewed the triage vital signs and the nursing notes.  Pertinent labs & imaging results that  were available during my care of the patient were  reviewed by me and considered in my medical decision making (see chart for details).     Muscle spasm with swelling  Treating with flexeril, heat, gentle stretching and massage.  Toradol given here for pain and she can take Ibuprofen and Tylenol as needed for pain at home.  Instruction and precautions given.  Follow up as needed for continued or worsening symptoms  Final Clinical Impressions(s) / UC Diagnoses   Final diagnoses:  Muscle swelling  Muscle spasm     Discharge Instructions     Heat to the area 2-3 times a day for 10-15 minutes at a time.  Gentle stretching and massage.  Ibuprofen 600 mg every 8 hours and tylenol 1000 mg every 8 hours as needed Toradol given here for pain.  Flexeril for muscle relaxation every 8 hours as needed. This may make you drowsy.  Follow up as needed for continued or worsening symptoms    ED Prescriptions    Medication Sig Dispense Auth. Provider   cyclobenzaprine (FLEXERIL) 5 MG tablet Take 1 tablet (5 mg total) by mouth 3 (three) times daily as needed for muscle spasms. 30 tablet Dahlia Byes A, NP     PDMP not reviewed this encounter.   Dahlia Byes A, NP 09/05/20 1301

## 2020-09-05 NOTE — ED Triage Notes (Signed)
Pt c/o back pain original site right upper back/scapular region, radiating to lower right back for approx 2 weeks. Also reports HA to right side of head, dull aching in nature   Has tried Salonpass, muscle vibration therapy, CBD gummies w/o improvement to back pain.  Denies rash, CP, nausea, SOB, dizziness, dysuria symptoms, known injury.  Last took ibuprofen yesterday.

## 2020-09-05 NOTE — Discharge Instructions (Addendum)
Heat to the area 2-3 times a day for 10-15 minutes at a time.  Gentle stretching and massage.  Ibuprofen 600 mg every 8 hours and tylenol 1000 mg every 8 hours as needed Toradol given here for pain.  Flexeril for muscle relaxation every 8 hours as needed. This may make you drowsy.  Follow up as needed for continued or worsening symptoms

## 2020-09-23 ENCOUNTER — Other Ambulatory Visit: Payer: Self-pay | Admitting: Internal Medicine

## 2020-09-23 DIAGNOSIS — F32A Depression, unspecified: Secondary | ICD-10-CM

## 2020-11-08 ENCOUNTER — Ambulatory Visit: Payer: 59 | Admitting: Internal Medicine

## 2021-01-15 ENCOUNTER — Other Ambulatory Visit: Payer: Self-pay | Admitting: Internal Medicine

## 2021-01-15 DIAGNOSIS — F419 Anxiety disorder, unspecified: Secondary | ICD-10-CM

## 2021-01-15 DIAGNOSIS — F32A Depression, unspecified: Secondary | ICD-10-CM

## 2021-02-21 ENCOUNTER — Other Ambulatory Visit: Payer: Self-pay

## 2021-02-21 ENCOUNTER — Ambulatory Visit
Admission: RE | Admit: 2021-02-21 | Discharge: 2021-02-21 | Disposition: A | Discharge status: Home / Self Care | Payer: 59 | Source: Ambulatory Visit | Attending: Sports Medicine | Admitting: Sports Medicine

## 2021-02-21 VITALS — BP 160/110 | HR 104 | Temp 98.6°F | Resp 16 | Ht 66.0 in | Wt 250.0 lb

## 2021-02-21 DIAGNOSIS — R0982 Postnasal drip: Secondary | ICD-10-CM

## 2021-02-21 DIAGNOSIS — R0989 Other specified symptoms and signs involving the circulatory and respiratory systems: Secondary | ICD-10-CM | POA: Diagnosis not present

## 2021-02-21 DIAGNOSIS — R52 Pain, unspecified: Secondary | ICD-10-CM | POA: Diagnosis not present

## 2021-02-21 DIAGNOSIS — R0981 Nasal congestion: Secondary | ICD-10-CM | POA: Diagnosis not present

## 2021-02-21 MED ORDER — FLUTICASONE PROPIONATE 50 MCG/ACT NA SUSP: 2.0000 | Freq: Every day | NASAL | 0 refills | Status: DC

## 2021-02-21 MED ORDER — LEVALBUTEROL TARTRATE 45 MCG/ACT IN AERO: 2.0000 | INHALATION_SPRAY | Freq: Four times a day (QID) | RESPIRATORY_TRACT | 12 refills | Status: DC | PRN

## 2021-02-21 NOTE — Discharge Instructions (Addendum)
As we discussed, I have prescribed you an inhaler to help open up your lower bronchioles.  Given that your heart rate is greater than 100, I have chosen levo albuterol instead of albuterol.  I have also prescribed Flonase for your nasal congestion. Please see educational handouts. Plenty of rest, plenty of fluids, Tylenol or Motrin for any fever or discomfort. If symptoms persist please see your primary care provider. If symptoms worsen please go to the ER.

## 2021-02-21 NOTE — ED Triage Notes (Signed)
Pt here with C/O feels winded in her chest, clearing throat, head congestion,  had Covid July 11th.

## 2021-02-21 NOTE — ED Provider Notes (Signed)
[facility]    CSN: [id number] Arrival date & time: 02/21/21  1708      History   Chief Complaint No chief complaint on file.   HPI Shannon Clayton is a 45 y.o. female.   45 year old female who presents for evaluation of URI type symptoms for 2 days.  Shannon Clayton reports generalized body aches, some chest congestion and tightness, and a sensation to clear Shannon Clayton voice with some nasal congestion.  Shannon Clayton does not report a cough.  No sore throat or ear pain.  No fever shakes chills.  No nausea vomiting or diarrhea.  No abdominal or urinary symptoms.  Shannon Clayton says Shannon Clayton symptoms are similar to when Shannon Clayton had COVID back on July 11 and had a positive home test.  Shannon [place]  Shannon Clayton does not work outside the home.  Shannon Clayton denies any significant chest pain or shortness of breath.  No red flag signs or symptoms elicited on history.   Past Medical History:  Diagnosis Date   Depression    Frequent headaches    Hypertension    Migraine headache with aura     Patient Active Problem List   Diagnosis Date Noted   ASCUS with positive high risk HPV cervical 07/27/2019   Elevated liver enzymes 06/23/2019   Annual physical exam 06/23/2019   Prediabetes 06/09/2019   Vitamin D deficiency 06/09/2019   Migraine with aura and without status migrainosus, not intractable 05/28/2019   Essential hypertension 05/28/2019   Anxiety and depression 05/28/2019   Toenail bruise, left, sequela 05/28/2019   Contraception management 05/10/2014   Cough 05/10/2014   Morbid obesity (HCC) 05/10/2014   Irregular periods 05/10/2014    Past Surgical History:  Procedure Laterality Date   CESAREAN SECTION  2006   ear pinning  1984   TONSILLECTOMY AND ADENOIDECTOMY  2011    OB History     Gravida  1   Para  1   Term  1   Preterm      AB      Living  1      SAB      IAB      Ectopic      Multiple      Live Births  1            Home Medications    Prior  to Admission medications   Medication Sig Start Date End Date Taking? Authorizing Provider  amLODipine (NORVASC) 2.5 MG tablet Take 1 tablet (2.5 mg total) by mouth daily. 05/07/20  Yes McLean-Scocuzza, Pasty Spillersracy N, MD  butalbital-acetaminophen-caffeine (FIORICET) 50-325-40 MG tablet Take 1-2 tablets by mouth daily as needed for headache. Limit use no more 2x per week 05/07/20 05/07/21 Yes McLean-Scocuzza, Pasty Spillersracy N, MD  Cholecalciferol 1.25 MG (50000 UT) capsule Take 1 capsule (50,000 Units total) by mouth once a week. 06/09/19  Yes McLean-Scocuzza, Pasty Spillersracy N, MD  fluticasone (FLONASE) 50 MCG/ACT nasal spray Place 2 sprays into both nostrils daily. 02/21/21  Yes Delton SeeBarnes, Genine Beckett, MD  levalbuterol [facility](XOPENEX HFA) 45 MCG/ACT inhaler Inhale 2 puffs into the lungs every 6 (six) hours as needed for shortness of breath. 02/21/21  Yes Delton SeeBarnes, Nicoya Friel, MD  Prenatal Vit-Fe Fumarate-FA (PNV PRENATAL PLUS MULTIVITAMIN) 27-1 MG TABS Take 1 tablet by mouth daily. 05/21/15  Yes Dianne DunAron, Talia M, MD  venlafaxine XR (EFFEXOR-XR) 37.5 MG 24 hr capsule TAKE 1 CAPSULE BY MOUTH DAILY WITH BREAKFAST. 01/15/21  Yes McLean-Scocuzza, Pasty Spillersracy N, MD  cyclobenzaprine (  FLEXERIL) 5 MG tablet Take 1 tablet (5 mg total) by mouth 3 (three) times daily as needed for muscle spasms. 09/05/20   Dahlia Byes A, NP  mupirocin ointment (BACTROBAN) 2 % Apply 1 application topically 2 (two) times daily. Hands 05/26/19   McLean-Scocuzza, Pasty Spillers, MD    Family History Family History  Problem Relation Age of Onset   Cancer Mother        MM   Arthritis Mother    Hyperlipidemia Mother    Hypertension Mother    Anxiety disorder Mother    Arthritis Father    Cancer Maternal Grandmother        lymphoma 2x   Hypertension Maternal Grandmother    Hypertension Maternal Grandfather    Diabetes Maternal Grandfather    Cancer Paternal Grandfather        skin cancer    Breast cancer Neg Hx     Social History Social History   Tobacco Use   Smoking status: Never    Smokeless tobacco: Never  Vaping Use   Vaping Use: Never used  Substance Use Topics   Alcohol use: Yes   Drug use: No     Allergies   Sumatriptan   Review of Systems Review of Systems  Constitutional:  Negative for activity change, appetite change, chills, diaphoresis, fatigue and fever.  HENT:  Positive for congestion and postnasal drip. Negative for ear pain, rhinorrhea, sinus pressure, sinus pain, sneezing and sore throat.   Eyes:  Negative for pain.  Respiratory:  Positive for chest tightness. Negative for cough, shortness of breath and wheezing.   Cardiovascular:  Negative for chest pain and palpitations.  Gastrointestinal:  Negative for abdominal pain, diarrhea, nausea and vomiting.  Genitourinary:  Negative for dysuria.  Musculoskeletal:  Positive for myalgias. Negative for back pain and neck pain.  Skin:  Negative for color change, pallor, rash and wound.  Neurological:  Negative for dizziness, syncope, weakness, light-headedness, numbness and headaches.  All other systems reviewed and are negative.   Physical Exam Triage Vital Signs ED Triage Vitals  Enc Vitals Group     BP 02/21/21 1755 (!) 160/110     Pulse Rate 02/21/21 1755 (!) 104     Resp 02/21/21 1755 16     Temp 02/21/21 1755 98.6 F (37 C)     Temp Source 02/21/21 1755 Oral     SpO2 02/21/21 1755 100 %     Weight 02/21/21 1751 250 lb (113.4 kg)     Height 02/21/21 1751 5\' 6"  (1.676 m)     Head Circumference --      Peak Flow --      Pain Score 02/21/21 1751 0     Pain Loc --      Pain Edu? --      Excl. in GC? --    No data found.  Updated Vital Signs BP (!) 160/110 (BP Location: Left Arm) Comment: Pt hasnt' taken BP meds today and has white coat  Pulse (!) 104   Temp 98.6 F (37 C) (Oral)   Resp 16   Ht 5\' 6"  (1.676 m)   Wt 113.4 kg   LMP 02/03/2021   SpO2 100%   BMI 40.35 kg/m   Visual Acuity Right Eye Distance:   Left Eye Distance:   Bilateral Distance:    Right Eye Near:    Left Eye Near:    Bilateral Near:     Physical Exam Vitals and nursing note reviewed.  Constitutional:      General: Shannon Clayton is not in acute distress.    Appearance: Normal appearance. Shannon Clayton is not ill-appearing, toxic-appearing or diaphoretic.  HENT:     Head: Normocephalic and atraumatic.     Nose: Congestion present. No rhinorrhea.     Mouth/Throat:     Mouth: Mucous membranes are moist.     Pharynx: No oropharyngeal exudate or posterior oropharyngeal erythema.  Eyes:     General: No scleral icterus.       Right eye: No discharge.     Extraocular Movements: Extraocular movements intact.     Conjunctiva/sclera: Conjunctivae normal.     Pupils: Pupils are equal, round, and reactive to light.  Cardiovascular:     Rate and Rhythm: Normal rate and regular rhythm.     Pulses: Normal pulses.     Heart sounds: Normal heart sounds. No murmur heard.   No friction rub. No gallop.  Pulmonary:     Effort: Pulmonary effort is normal.     Breath sounds: Normal breath sounds. No stridor. No wheezing, rhonchi or rales.  Musculoskeletal:     Cervical back: Normal range of motion and neck supple. No rigidity.  Skin:    General: Skin is warm and dry.     Capillary Refill: Capillary refill takes less than 2 seconds.  Neurological:     General: No focal deficit present.     Mental Status: Shannon Clayton is alert and oriented to person, place, and time.     UC Treatments / Results  Labs (all labs ordered are listed, but only abnormal results are displayed) Labs Reviewed - No data to display  EKG   Radiology No results found.  Procedures Procedures (including critical care time)  Medications Ordered in UC Medications - No data to display  Initial Impression / Assessment and Plan / UC Course  I have reviewed the triage vital signs and the nursing notes.  Pertinent labs & imaging results that were available during my care of the patient were reviewed by me and considered in my medical decision  making (see chart for details).  Clinical impression: 1.  Chest congestion 2.  Postnasal drip 3.  Nasal congestion 4.  Generalized body aches  Treatment plan: 1.  The findings and treatment plan were discussed in detail with the patient.  Patient was in agreement. 2.  We discussed repeating Shannon Clayton COVID test however since Shannon Clayton has been positive in the last months I did not feel the utility to do so.  Shannon Clayton vital signs are stable as is Shannon Clayton physical exam. 3.  I felt Shannon Clayton would benefit from opening up the lower bronchioles and coughing up some of the sputum that Shannon Clayton is having a hard time doing and clearing Shannon Clayton throat.  Shannon Clayton heart rate was 104 and Shannon Clayton felt Shannon Clayton was nervous but I went ahead and prescribe levo albuterol instead of albuterol. 4.  Also prescribed Flonase for Shannon Clayton congestion. 5.  Educational handouts provided. 6.  Plenty of rest, plenty of fluids, Tylenol or Motrin for any fever or discomfort. 7.  If symptoms persisted I asked Shannon Clayton to see Shannon Clayton primary care provider. 8.  If symptoms worsen then Shannon Clayton should go to the ER. 9.  Shannon Clayton was stable upon discharge and will follow-up here as needed.    Final Clinical Impressions(s) / UC Diagnoses   Final diagnoses:  Chest congestion  Nasal congestion  Generalized body aches  Postnasal drip     Discharge Instructions  As we discussed, I have prescribed you an inhaler to help open up your lower bronchioles.  Given that your heart rate is greater than 100, I have chosen levo albuterol instead of albuterol.  I have also prescribed Flonase for your nasal congestion. Please see educational handouts. Plenty of rest, plenty of fluids, Tylenol or Motrin for any fever or discomfort. If symptoms persist please see your primary care provider. If symptoms worsen please go to the ER.     ED Prescriptions     Medication Sig Dispense Auth. Provider   levalbuterol [facility] HFA) 45 MCG/ACT inhaler Inhale 2 puffs into the lungs every 6 (six) hours as  needed for shortness of breath. 1 each Delton See, MD   fluticasone [facility]) 50 MCG/ACT nasal spray Place 2 sprays into both nostrils daily. 15.8 mL Delton See, MD      PDMP not reviewed this encounter.   Delton See, MD 02/21/21 [id number]

## 2021-03-01 ENCOUNTER — Ambulatory Visit
Admission: EM | Admit: 2021-03-01 | Discharge: 2021-03-01 | Disposition: A | Discharge status: Home / Self Care | Payer: 59 | Attending: Family Medicine | Admitting: Family Medicine

## 2021-03-01 ENCOUNTER — Encounter: Payer: Self-pay | Admitting: Emergency Medicine

## 2021-03-01 DIAGNOSIS — J40 Bronchitis, not specified as acute or chronic: Secondary | ICD-10-CM

## 2021-03-01 DIAGNOSIS — J329 Chronic sinusitis, unspecified: Secondary | ICD-10-CM | POA: Diagnosis not present

## 2021-03-01 MED ORDER — PROMETHAZINE-DM 6.25-15 MG/5ML PO SYRP: 5.0000 mL | ORAL_SOLUTION | Freq: Four times a day (QID) | ORAL | 0 refills | Status: DC | PRN

## 2021-03-01 MED ORDER — CEFDINIR 300 MG PO CAPS: 300.0000 mg | ORAL_CAPSULE | Freq: Two times a day (BID) | ORAL | 0 refills | Status: AC

## 2021-03-01 MED ORDER — PREDNISONE 10 MG PO TABS: 10.0000 mg | ORAL_TABLET | Freq: Every day | ORAL | 0 refills | Status: AC

## 2021-03-01 MED ORDER — FLUCONAZOLE 150 MG PO TABS: 150.0000 mg | ORAL_TABLET | Freq: Once | ORAL | 0 refills | Status: AC

## 2021-03-01 NOTE — ED Provider Notes (Signed)
[name]    CSN: [id number] Arrival date & time: 03/01/21  1456      History   Chief Complaint Chief Complaint  Patient presents with   Cough   Nasal Congestion    HPI Shannon Clayton is a 45 y.o. female.   HPI Patient presents today with ongoing cough and nasal congestion and chest congestion following a prior visit at another urgent care facility over a week ago.  She was prescribed an inhaler however reports she is not having any wheezing or shortness of breath therefore did not pick up inhaler.  She has been managing with over-the-counter medication which seemed to improve symptoms temporarily however over the last few days she has progressively experienced worsening coughing with postnasal drainage and sensation of heaviness in the upper bronchioles.  Of note patient's blood pressure is elevated today and was elevated at her last urgent care visit she reports sometimes she forgets to take her blood pressure medication but she is also been taken over-the-counter medication as well.  She will continue to monitor blood pressure at home and follow-up with her primary care doctor if blood pressure remains elevated. Past Medical History:  Diagnosis Date   Depression    Frequent headaches    Hypertension    Migraine headache with aura     Patient Active Problem List   Diagnosis Date Noted   ASCUS with positive high risk HPV cervical 07/27/2019   Elevated liver enzymes 06/23/2019   Annual physical exam 06/23/2019   Prediabetes 06/09/2019   Vitamin D deficiency 06/09/2019   Migraine with aura and without status migrainosus, not intractable 05/28/2019   Essential hypertension 05/28/2019   Anxiety and depression 05/28/2019   Toenail bruise, left, sequela 05/28/2019   Contraception management 05/10/2014   Cough 05/10/2014   Morbid obesity (HCC) 05/10/2014   Irregular periods 05/10/2014    Past Surgical History:  Procedure Laterality Date   CESAREAN SECTION   2006   ear pinning  1984   TONSILLECTOMY AND ADENOIDECTOMY  2011    OB History     Gravida  1   Para  1   Term  1   Preterm      AB      Living  1      SAB      IAB      Ectopic      Multiple      Live Births  1            Home Medications    Prior to Admission medications   Medication Sig Start Date End Date Taking? Authorizing Provider  cefdinir (OMNICEF) 300 MG capsule Take 1 capsule (300 mg total) by mouth 2 (two) times daily for 10 days. 03/01/21 03/11/21 Yes Bing Neighbors, FNP  fluconazole (DIFLUCAN) 150 MG tablet Take 1 tablet (150 mg total) by mouth once for 1 dose. Repeat if needed 03/01/21 03/01/21 Yes Bing Neighbors, FNP  predniSONE (DELTASONE) 10 MG tablet Take 1 tablet (10 mg total) by mouth daily with breakfast for 5 days. 03/01/21 03/06/21 Yes Bing Neighbors, FNP  promethazine-dextromethorphan (PROMETHAZINE-DM) 6.25-15 MG/5ML syrup Take 5 mLs by mouth 4 (four) times daily as needed for cough. 03/01/21  Yes Bing Neighbors, FNP  amLODipine (NORVASC) 2.5 MG tablet Take 1 tablet (2.5 mg total) by mouth daily. 05/07/20   McLean-Scocuzza, Pasty Spillers, MD  butalbital-acetaminophen-caffeine (FIORICET) [phone] MG tablet Take 1-2 tablets by mouth daily as needed for headache.  Limit use no more 2x per week 05/07/20 05/07/21  McLean-Scocuzza, Pasty Spillers, MD  Cholecalciferol 1.25 MG (50000 UT) capsule Take 1 capsule (50,000 Units total) by mouth once a week. 06/09/19   McLean-Scocuzza, Pasty Spillers, MD  cyclobenzaprine (FLEXERIL) 5 MG tablet Take 1 tablet (5 mg total) by mouth 3 (three) times daily as needed for muscle spasms. 09/05/20   Dahlia Byes A, NP  fluticasone (FLONASE) 50 MCG/ACT nasal spray Place 2 sprays into both nostrils daily. 02/21/21   Delton See, MD  levalbuterol [facility] HFA) 45 MCG/ACT inhaler Inhale 2 puffs into the lungs every 6 (six) hours as needed for shortness of breath. 02/21/21   Delton See, MD  mupirocin ointment (BACTROBAN) 2 % Apply 1  application topically 2 (two) times daily. Hands 05/26/19   McLean-Scocuzza, Pasty Spillers, MD  Prenatal Vit-Fe Fumarate-FA (PNV PRENATAL PLUS MULTIVITAMIN) 27-1 MG TABS Take 1 tablet by mouth daily. 05/21/15   Dianne Dun, MD  venlafaxine XR (EFFEXOR-XR) 37.5 MG 24 hr capsule TAKE 1 CAPSULE BY MOUTH DAILY WITH BREAKFAST. 01/15/21   McLean-Scocuzza, Pasty Spillers, MD    Family History Family History  Problem Relation Age of Onset   Cancer Mother        MM   Arthritis Mother    Hyperlipidemia Mother    Hypertension Mother    Anxiety disorder Mother    Arthritis Father    Cancer Maternal Grandmother        lymphoma 2x   Hypertension Maternal Grandmother    Hypertension Maternal Grandfather    Diabetes Maternal Grandfather    Cancer Paternal Grandfather        skin cancer    Breast cancer Neg Hx     Social History Social History   Tobacco Use   Smoking status: Never   Smokeless tobacco: Never  Vaping Use   Vaping Use: Never used  Substance Use Topics   Alcohol use: Yes   Drug use: No     Allergies   Sumatriptan   Review of Systems Review of Systems Pertinent negatives listed in HPI  Physical Exam Triage Vital Signs ED Triage Vitals  Enc Vitals Group     BP 03/01/21 1505 (!) 170/131     Pulse Rate 03/01/21 1505 (!) 102     Resp 03/01/21 1505 18     Temp 03/01/21 1505 98.8 F (37.1 C)     Temp Source 03/01/21 1505 Oral     SpO2 03/01/21 1505 96 %     Weight --      Height --      Head Circumference --      Peak Flow --      Pain Score 03/01/21 1504 0     Pain Loc --      Pain Edu? --      Excl. in GC? --    No data found.  Updated Vital Signs BP (!) 170/131 (BP Location: Left Wrist)   Pulse (!) 102   Temp 98.8 F (37.1 C) (Oral)   Resp 18   LMP 02/03/2021   SpO2 96%   Visual Acuity Right Eye Distance:   Left Eye Distance:   Bilateral Distance:    Right Eye Near:   Left Eye Near:    Bilateral Near:     Physical Exam   General Appearance:     Alert, cooperative, no distress  HENT:   Normocephalic, ears normal, nares mucosal edema with congestion, rhinorrhea, oropharynx erythematous w/o exudate  Eyes:    PERRL, conjunctiva/corneas clear, EOM's intact       Lungs:     Clear to auscultation bilaterally, respirations unlabored  Heart:    Regular rate and rhythm  Neurologic:   Awake, alert, oriented x 3. No apparent focal neurological           defect.       UC Treatments / Results  Labs (all labs ordered are listed, but only abnormal results are displayed) Labs Reviewed - No data to display  EKG   Radiology No results found.  Procedures Procedures (including critical care time)  Medications Ordered in UC Medications - No data to display  Initial Impression / Assessment and Plan / UC Course  I have reviewed the triage vital signs and the nursing notes.  Pertinent labs & imaging results that were available during my care of the patient were reviewed by me and considered in my medical decision making (see chart for details).    Sinobronchitis Treatment per discharge instructions Elevated blood pressure patient will continue to manage at home with current antihypertensive medication however will follow-up with primary care if blood pressure remains elevated. Return precautions given if symptoms worsen or do not improve. Final Clinical Impressions(s) / UC Diagnoses   Final diagnoses:  Sinobronchitis     Discharge Instructions      Keep an eye on your blood pressure. It it is elevated today and was elevated during her last visit however this can be attributed to taken over-the-counter cough and cold medications.  If blood pressure remains elevated follow-up with primary care doctor as your current antihypertensive medication may need to be increased.   ED Prescriptions     Medication Sig Dispense Auth. Provider   cefdinir (OMNICEF) 300 MG capsule Take 1 capsule (300 mg total) by mouth 2 (two) times daily for  10 days. 20 capsule Bing Neighbors, FNP   fluconazole (DIFLUCAN) 150 MG tablet Take 1 tablet (150 mg total) by mouth once for 1 dose. Repeat if needed 2 tablet Bing Neighbors, FNP   promethazine-dextromethorphan (PROMETHAZINE-DM) 6.25-15 MG/5ML syrup Take 5 mLs by mouth 4 (four) times daily as needed for cough. 120 mL Bing Neighbors, FNP   predniSONE (DELTASONE) 10 MG tablet Take 1 tablet (10 mg total) by mouth daily with breakfast for 5 days. 5 tablet Bing Neighbors, FNP      PDMP not reviewed this encounter.   Bing Neighbors, [place] 03/01/21 [phone]

## 2021-03-01 NOTE — Discharge Instructions (Addendum)
Keep an eye on your blood pressure. It it is elevated today and was elevated during her last visit however this can be attributed to taken over-the-counter cough and cold medications.  If blood pressure remains elevated follow-up with primary care doctor as your current antihypertensive medication may need to be increased.

## 2021-03-01 NOTE — ED Triage Notes (Signed)
Pt here with URI sx that have persisted over one week and now has a nagging cough.

## 2021-03-15 ENCOUNTER — Ambulatory Visit
Admission: EM | Admit: 2021-03-15 | Discharge: 2021-03-15 | Disposition: A | Discharge status: Home / Self Care | Payer: 59 | Attending: Emergency Medicine | Admitting: Emergency Medicine

## 2021-03-15 ENCOUNTER — Other Ambulatory Visit: Payer: Self-pay

## 2021-03-15 ENCOUNTER — Encounter: Payer: Self-pay | Admitting: Emergency Medicine

## 2021-03-15 DIAGNOSIS — J40 Bronchitis, not specified as acute or chronic: Secondary | ICD-10-CM

## 2021-03-15 DIAGNOSIS — I10 Essential (primary) hypertension: Secondary | ICD-10-CM

## 2021-03-15 DIAGNOSIS — J329 Chronic sinusitis, unspecified: Secondary | ICD-10-CM | POA: Diagnosis not present

## 2021-03-15 MED ORDER — PREDNISONE 10 MG (21) PO TBPK: ORAL_TABLET | Freq: Every day | ORAL | 0 refills | Status: DC

## 2021-03-15 MED ORDER — FLUCONAZOLE 150 MG PO TABS: 150.0000 mg | ORAL_TABLET | Freq: Every day | ORAL | 0 refills | Status: DC

## 2021-03-15 MED ORDER — AZITHROMYCIN 250 MG PO TABS: 250.0000 mg | ORAL_TABLET | Freq: Every day | ORAL | 0 refills | Status: DC

## 2021-03-15 NOTE — ED Provider Notes (Signed)
Shannon Clayton    CSN: [id number] Arrival date & time: 03/15/21  0810      History   Chief Complaint Chief Complaint  Patient presents with   Cough   Sore Throat     HPI Shannon Clayton is a 45 y.o. female.  Patient presents with 2 to 3-week history of nonproductive cough, nasal congestion, postnasal drip, sore throat.  She denies fever, chills, rash, shortness of breath, wheezing, or other symptoms.  Patient was seen at [facility] urgent care on 02/21/2021; diagnosed with chest congestion, nasal congestion, generalized body aches, postnasal drip; treated with Xopenex and Flonase nasal spray.  She was seen at this urgent care on 03/01/2021; diagnosed with sinobronchitis; treated with cefdinir, Diflucan, Promethazine DM, prednisone.  She states her symptoms improved after taking this medication but have not completely resolved.  Her medical history includes hypertension, migraine headaches, morbid obesity, anxiety, depression, prediabetes.  Patient reports she has not taken her blood pressure medication yet this morning.  The history is provided by the patient and medical records.   Past Medical History:  Diagnosis Date   Depression    Frequent headaches    Hypertension    Migraine headache with aura     Patient Active Problem List   Diagnosis Date Noted   ASCUS with positive high risk HPV cervical 07/27/2019   Elevated liver enzymes 06/23/2019   Annual physical exam 06/23/2019   Prediabetes 06/09/2019   Vitamin D deficiency 06/09/2019   Migraine with aura and without status migrainosus, not intractable 05/28/2019   Essential hypertension 05/28/2019   Anxiety and depression 05/28/2019   Toenail bruise, left, sequela 05/28/2019   Contraception management 05/10/2014   Cough 05/10/2014   Morbid obesity (HCC) 05/10/2014   Irregular periods 05/10/2014    Past Surgical History:  Procedure Laterality Date   CESAREAN SECTION  2006   ear pinning  1984   TONSILLECTOMY  AND ADENOIDECTOMY  2011    OB History     Gravida  1   Para  1   Term  1   Preterm      AB      Living  1      SAB      IAB      Ectopic      Multiple      Live Births  1            Home Medications    Prior to Admission medications   Medication Sig Start Date End Date Taking? Authorizing Provider  azithromycin (ZITHROMAX) 250 MG tablet Take 1 tablet (250 mg total) by mouth daily. Take first 2 tablets together, then 1 every day until finished. 03/15/21  Yes Mickie Bail, NP  fluconazole (DIFLUCAN) 150 MG tablet Take 1 tablet (150 mg total) by mouth daily. Take one tablet today.  May repeat in 3 days. 03/15/21  Yes Mickie Bail, NP  predniSONE (STERAPRED UNI-PAK 21 TAB) 10 MG (21) TBPK tablet Take by mouth daily. As directed 03/15/21  Yes Mickie Bail, NP  amLODipine (NORVASC) 2.5 MG tablet Take 1 tablet (2.5 mg total) by mouth daily. 05/07/20   McLean-Scocuzza, Pasty Spillers, MD  butalbital-acetaminophen-caffeine (FIORICET) [phone] MG tablet Take 1-2 tablets by mouth daily as needed for headache. Limit use no more 2x per week 05/07/20 05/07/21  McLean-Scocuzza, Pasty Spillers, MD  Cholecalciferol 1.25 MG (50000 UT) capsule Take 1 capsule (50,000 Units total) by mouth once a week. 06/09/19   McLean-Scocuzza,  Pasty Spillers, MD  cyclobenzaprine (FLEXERIL) 5 MG tablet Take 1 tablet (5 mg total) by mouth 3 (three) times daily as needed for muscle spasms. 09/05/20   Dahlia Byes A, NP  fluticasone (FLONASE) 50 MCG/ACT nasal spray Place 2 sprays into both nostrils daily. 02/21/21   Delton See, MD  levalbuterol [facility] HFA) 45 MCG/ACT inhaler Inhale 2 puffs into the lungs every 6 (six) hours as needed for shortness of breath. 02/21/21   Delton See, MD  mupirocin ointment (BACTROBAN) 2 % Apply 1 application topically 2 (two) times daily. Hands 05/26/19   McLean-Scocuzza, Pasty Spillers, MD  Prenatal Vit-Fe Fumarate-FA (PNV PRENATAL PLUS MULTIVITAMIN) 27-1 MG TABS Take 1 tablet by mouth daily.  05/21/15   Dianne Dun, MD  promethazine-dextromethorphan (PROMETHAZINE-DM) 6.25-15 MG/5ML syrup Take 5 mLs by mouth 4 (four) times daily as needed for cough. 03/01/21   Bing Neighbors, FNP  venlafaxine XR (EFFEXOR-XR) 37.5 MG 24 hr capsule TAKE 1 CAPSULE BY MOUTH DAILY WITH BREAKFAST. 01/15/21   McLean-Scocuzza, Pasty Spillers, MD    Family History Family History  Problem Relation Age of Onset   Cancer Mother        MM   Arthritis Mother    Hyperlipidemia Mother    Hypertension Mother    Anxiety disorder Mother    Arthritis Father    Cancer Maternal Grandmother        lymphoma 2x   Hypertension Maternal Grandmother    Hypertension Maternal Grandfather    Diabetes Maternal Grandfather    Cancer Paternal Grandfather        skin cancer    Breast cancer Neg Hx     Social History Social History   Tobacco Use   Smoking status: Never   Smokeless tobacco: Never  Vaping Use   Vaping Use: Never used  Substance Use Topics   Alcohol use: Yes   Drug use: No     Allergies   Sumatriptan   Review of Systems Review of Systems  Constitutional:  Negative for chills and fever.  HENT:  Positive for congestion, postnasal drip and sore throat. Negative for ear pain.   Respiratory:  Positive for cough. Negative for shortness of breath.   Cardiovascular:  Negative for chest pain and palpitations.  Gastrointestinal:  Negative for abdominal pain and vomiting.  Musculoskeletal:  Negative for back pain.  Skin:  Negative for color change and rash.  All other systems reviewed and are negative.   Physical Exam Triage Vital Signs ED Triage Vitals  Enc Vitals Group     BP      Pulse      Resp      Temp      Temp src      SpO2      Weight      Height      Head Circumference      Peak Flow      Pain Score      Pain Loc      Pain Edu?      Excl. in GC?    No data found.  Updated Vital Signs BP (!) 177/92   Pulse (!) 101   Temp 97.8 F (36.6 C)   Resp (!) 22   SpO2 98%    Visual Acuity Right Eye Distance:   Left Eye Distance:   Bilateral Distance:    Right Eye Near:   Left Eye Near:    Bilateral Near:     Physical Exam  Vitals and nursing note reviewed.  Constitutional:      General: She is not in acute distress.    Appearance: She is well-developed.  HENT:     Head: Normocephalic and atraumatic.     Right Ear: Tympanic membrane normal.     Left Ear: Tympanic membrane normal.     Nose: Congestion present.     Mouth/Throat:     Mouth: Mucous membranes are moist.     Pharynx: Oropharynx is clear.  Eyes:     Conjunctiva/sclera: Conjunctivae normal.  Cardiovascular:     Rate and Rhythm: Normal rate and regular rhythm.     Heart sounds: Normal heart sounds.  Pulmonary:     Effort: Pulmonary effort is normal. No respiratory distress.     Breath sounds: Normal breath sounds. No wheezing.  Abdominal:     Palpations: Abdomen is soft.     Tenderness: There is no abdominal tenderness.  Musculoskeletal:     Cervical back: Neck supple.  Skin:    General: Skin is warm and dry.  Neurological:     General: No focal deficit present.     Mental Status: She is alert and oriented to person, place, and time.     Gait: Gait normal.  Psychiatric:        Mood and Affect: Mood normal.        Behavior: Behavior normal.     UC Treatments / Results  Labs (all labs ordered are listed, but only abnormal results are displayed) Labs Reviewed - No data to display  EKG   Radiology No results found.  Procedures Procedures (including critical care time)  Medications Ordered in UC Medications - No data to display  Initial Impression / Assessment and Plan / UC Course  I have reviewed the triage vital signs and the nursing notes.  Pertinent labs & imaging results that were available during my care of the patient were reviewed by me and considered in my medical decision making (see chart for details).  Sinobronchitis.  Elevated blood pressure reading  with hypertension.  Treating with Zithromax and prednisone taper.  Per patient request and report of yeast infections when taking antibiotics, 1 tablet of Diflucan prescribed.  Discussed with patient that she will need to follow-up with her PCP if her symptoms are not improving after taking this second round of antibiotics and prednisone.  Education provided on sinusitis.  Also discussed that her blood pressure is elevated today and needs to be rechecked by her PCP in 1 to 2 weeks.  Education provided on managing hypertension.  Patient agrees to plan of care.   Final Clinical Impressions(s) / UC Diagnoses   Final diagnoses:  Sinobronchitis  Elevated blood pressure reading in office with diagnosis of hypertension     Discharge Instructions      Take the Zithromax and prednisone as directed.  Take the Diflucan if needed.  Follow-up with your primary care provider next week if your symptoms are not improving.    Your blood pressure is elevated today at 177/92.  Please have this rechecked by your primary care provider in 1-2 weeks.          ED Prescriptions     Medication Sig Dispense Auth. Provider   predniSONE (STERAPRED UNI-PAK 21 TAB) 10 MG (21) TBPK tablet Take by mouth daily. As directed 21 tablet Mickie Bail, NP   azithromycin (ZITHROMAX) 250 MG tablet Take 1 tablet (250 mg total) by mouth daily. Take first 2 tablets  together, then 1 every day until finished. 6 tablet Mickie Bailate, Tifanie Gardiner H, NP   fluconazole (DIFLUCAN) 150 MG tablet Take 1 tablet (150 mg total) by mouth daily. Take one tablet today.  May repeat in 3 days. 1 tablet Mickie Bailate, Pierson Vantol H, NP      PDMP not reviewed this encounter.   Mickie Bailate, Martavius Lusty H, NP 03/15/21 [phone]

## 2021-03-15 NOTE — ED Triage Notes (Signed)
Pt was seen 2 weeks ago for cough and sore throat. She is still experiencing sx after treatment and with a productive cough with foul tasting sputum. Sore throat is still present, but slightly better.

## 2021-03-15 NOTE — Discharge Instructions (Addendum)
Take the Zithromax and prednisone as directed.  Take the Diflucan if needed.  Follow-up with your primary care provider next week if your symptoms are not improving.    Your blood pressure is elevated today at 177/92.  Please have this rechecked by your primary care provider in 1-2 weeks.

## 2021-05-21 ENCOUNTER — Other Ambulatory Visit: Payer: Self-pay | Admitting: Internal Medicine

## 2021-05-21 DIAGNOSIS — I10 Essential (primary) hypertension: Secondary | ICD-10-CM

## 2021-07-23 ENCOUNTER — Other Ambulatory Visit: Payer: Self-pay | Admitting: Internal Medicine

## 2021-07-23 DIAGNOSIS — G43109 Migraine with aura, not intractable, without status migrainosus: Secondary | ICD-10-CM

## 2021-07-28 ENCOUNTER — Other Ambulatory Visit: Payer: Self-pay | Admitting: Internal Medicine

## 2021-07-28 DIAGNOSIS — Z1231 Encounter for screening mammogram for malignant neoplasm of breast: Secondary | ICD-10-CM

## 2021-07-29 ENCOUNTER — Telehealth: Payer: Self-pay | Admitting: Internal Medicine

## 2021-07-29 DIAGNOSIS — G43109 Migraine with aura, not intractable, without status migrainosus: Secondary | ICD-10-CM

## 2021-07-30 NOTE — Telephone Encounter (Signed)
30 tablets sent in 07/23/21

## 2021-07-30 NOTE — Telephone Encounter (Signed)
Patient is needing a new prescription for  butalbital-acetaminophen-caffeine (FIORICET) 50-325-40 MG tablet Patient was advised that in order to get this refilled she would need to be seen. Patient was scheduled for 2/10 with PCP. Patient was advised medication would be refilled up until her appointment as long as she kept this appointment. Patient uses [facility] on [address]

## 2021-07-30 NOTE — Telephone Encounter (Signed)
Sent to   [id number] [address], [facility] - [address]  [address], [place] [address]  Phone:  [phone]

## 2021-08-12 ENCOUNTER — Other Ambulatory Visit: Payer: Self-pay | Admitting: Internal Medicine

## 2021-08-12 DIAGNOSIS — G43109 Migraine with aura, not intractable, without status migrainosus: Secondary | ICD-10-CM

## 2021-08-15 ENCOUNTER — Ambulatory Visit: Payer: 59 | Admitting: Internal Medicine

## 2021-09-01 ENCOUNTER — Other Ambulatory Visit: Payer: Self-pay

## 2021-09-01 ENCOUNTER — Ambulatory Visit
Admission: RE | Admit: 2021-09-01 | Discharge: 2021-09-01 | Disposition: A | Discharge status: Home / Self Care | Payer: 59 | Source: Ambulatory Visit | Attending: Internal Medicine | Admitting: Internal Medicine

## 2021-09-01 DIAGNOSIS — Z1231 Encounter for screening mammogram for malignant neoplasm of breast: Secondary | ICD-10-CM | POA: Diagnosis not present

## 2021-10-03 ENCOUNTER — Encounter: Payer: Self-pay | Admitting: Nurse Practitioner

## 2021-10-03 ENCOUNTER — Ambulatory Visit: Payer: 59 | Admitting: Nurse Practitioner

## 2021-10-03 VITALS — BP 136/86 | HR 92 | Resp 16 | Ht 66.0 in | Wt 345.0 lb

## 2021-10-03 DIAGNOSIS — Z13220 Encounter for screening for lipoid disorders: Secondary | ICD-10-CM

## 2021-10-03 DIAGNOSIS — E559 Vitamin D deficiency, unspecified: Secondary | ICD-10-CM | POA: Diagnosis not present

## 2021-10-03 DIAGNOSIS — G43109 Migraine with aura, not intractable, without status migrainosus: Secondary | ICD-10-CM

## 2021-10-03 DIAGNOSIS — F32A Depression, unspecified: Secondary | ICD-10-CM

## 2021-10-03 DIAGNOSIS — F419 Anxiety disorder, unspecified: Secondary | ICD-10-CM

## 2021-10-03 DIAGNOSIS — N926 Irregular menstruation, unspecified: Secondary | ICD-10-CM

## 2021-10-03 DIAGNOSIS — Z114 Encounter for screening for human immunodeficiency virus [HIV]: Secondary | ICD-10-CM

## 2021-10-03 DIAGNOSIS — I10 Essential (primary) hypertension: Secondary | ICD-10-CM

## 2021-10-03 DIAGNOSIS — Z131 Encounter for screening for diabetes mellitus: Secondary | ICD-10-CM

## 2021-10-03 DIAGNOSIS — Z1159 Encounter for screening for other viral diseases: Secondary | ICD-10-CM

## 2021-10-03 DIAGNOSIS — M25559 Pain in unspecified hip: Secondary | ICD-10-CM | POA: Insufficient documentation

## 2021-10-03 LAB — POCT URINE PREGNANCY: Preg Test, Ur: NEGATIVE

## 2021-10-03 MED ORDER — VENLAFAXINE HCL ER 75 MG PO CP24: 75.0000 mg | ORAL_CAPSULE | Freq: Every day | ORAL | 0 refills | Status: DC

## 2021-10-03 MED ORDER — BUTALBITAL-APAP-CAFFEINE 50-325-40 MG PO TABS: 1.0000 | ORAL_TABLET | Freq: Every day | ORAL | 2 refills | Status: DC | PRN

## 2021-10-03 NOTE — Progress Notes (Signed)
? ?BP 136/86   Pulse 92   Resp 16   Ht 5\' 6"  (1.676 m)   Wt (!) 345 lb (156.5 kg)   SpO2 96%   BMI 55.68 kg/m?   ? ?Subjective:  ? ? Patient ID: , female    DOB: January 04, 1976, 46 y.o.   MRN: [id number] ? ?HPI: ?Shannon Clayton is a 46 y.o. female, here alone ? ?Chief Complaint  ?Patient presents with  ? Establish Care  ? ?Establish care: Last physical was a few years ago. She is up to date on her pap done in 2021.  ? ?Migraines: She says she has a migraine ever so often maybe a couple times a month. She says she does get auras with her migraines.  She is currently treating it with Fioricet. Discussed other options for treatment and she will think about it.   ? ?HTN: She says she has been on amlodipine 2.5 mg daily for a long time. She does not check her blood pressure at home yet.  She is getting an 2022 delivery of a blood pressure cuff.  Her blood pressure today was 142/94, recheck was 136/86. She denies any chest pain, shortness of breath, headaches different than migraines or blurred vision. She is going to increase her amlodipine to 5 mg daily.  Discussed taking blood pressure and keeping a record and bring to next appointment. ? ?Morbid obesity: Her weight today is 345 lbs, with a BMI of 55.68.  She says she has not been doing anything for weight loss.  She was doing weight watchers and lost a lot of weight, Covid hit and she gained some weight back.  ? ?Vitamin D deficiency: She says she has been taking vitamin D.  Her last vitamin d level was 14.02.  Will get labs.  ? ?Missed period: Her LMP: 08/20/2021, she is sexually active will get urine pregnancy test.  ? ?Depression and anxiety: She is currently on venlafaxine 37.5 mg daily.  She says her anxiety is up.  Her depression screening is negative, but her anxiety screening is positive. Will increase effexor to 75 mg daily.  She will follow up in four weeks.  ? ?  10/03/2021  ?  2:30 PM 05/26/2019  ?  3:55 PM  ?Depression screen PHQ  2/9  ?Decreased Interest 0 2  ?Down, Depressed, Hopeless 0 2  ?PHQ - 2 Score 0 4  ?Altered sleeping 2 3  ?Tired, decreased energy 1 2  ?Change in appetite 0 2  ?Feeling bad or failure about yourself  0 3  ?Trouble concentrating 0 0  ?Moving slowly or fidgety/restless 0 1  ?Suicidal thoughts 0 0  ?PHQ-9 Score 3 15  ?Difficult doing work/chores  Somewhat difficult  ? ? ? ?  10/03/2021  ?  3:02 PM 05/26/2019  ?  3:56 PM  ?GAD 7 : Generalized Anxiety Score  ?Nervous, Anxious, on Edge 3 3  ?Control/stop worrying 3 3  ?Worry too much - different things 2 3  ?Trouble relaxing 0 3  ?Restless 0 2  ?Easily annoyed or irritable 1 3  ?Afraid - awful might happen 1 3  ?Total GAD 7 Score 10 20  ?Anxiety Difficulty Not difficult at all Somewhat difficult  ? ? ?Relevant past medical, surgical, family and social history reviewed and updated as indicated. Interim medical history since our last visit reviewed. ?Allergies and medications reviewed and updated. ? ?Review of Systems ? ?Constitutional: Negative for fever or weight change.  ?Respiratory:  Negative for cough and shortness of breath.   ?Cardiovascular: Negative for chest pain or palpitations.  ?Gastrointestinal: Negative for abdominal pain, no bowel changes.  ?Musculoskeletal: Negative for gait problem or joint swelling.  ?Skin: Negative for rash.  ?Neurological: Negative for dizziness or headache.  ?No other specific complaints in a complete review of systems (except as listed in HPI above).  ? ?   ?Objective:  ?  ?BP 136/86   Pulse 92   Resp 16   Ht 5\' 6"  (1.676 m)   Wt (!) 345 lb (156.5 kg)   SpO2 96%   BMI 55.68 kg/m?   ?Wt Readings from Last 3 Encounters:  ?10/03/21 (!) 345 lb (156.5 kg)  ?02/21/21 250 lb (113.4 kg)  ?05/07/20 (!) 338 lb 6.4 oz (153.5 kg)  ?  ?Physical Exam ? ?Constitutional: Patient appears well-developed and well-nourished. Obese  No distress.  ?HEENT: head atraumatic, normocephalic, pupils equal and reactive to light,  neck supple ?Cardiovascular:  Normal rate, regular rhythm and normal heart sounds.  No murmur heard. No BLE edema. ?Pulmonary/Chest: Effort normal and breath sounds normal. No respiratory distress. ?Abdominal: Soft.  There is no tenderness. ?Psychiatric: Patient has a normal mood and affect. behavior is normal. Judgment and thought content normal.  ? ?Results for orders placed or performed during the hospital encounter of 09/05/20  ?POCT urinalysis dipstick  ?Result Value Ref Range  ? Color, UA yellow yellow  ? Clarity, UA clear clear  ? Glucose, UA negative negative mg/dL  ? Bilirubin, UA negative negative  ? Ketones, POC UA negative negative mg/dL  ? Spec Grav, UA 1.025 1.010 - 1.025  ? Blood, UA moderate (A) negative  ? pH, UA 7.5 5.0 - 8.0  ? Protein Ur, POC negative negative mg/dL  ? Urobilinogen, UA 0.2 0.2 or 1.0 E.U./dL  ? Nitrite, UA Negative Negative  ? Leukocytes, UA Negative Negative  ? ?   ?Assessment & Plan:  ? ?1. 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 Limit use no more 2x per week  Dispense: 30 tablet; Refill: 2 ? ?2. Essential hypertension ?-increase amlodipine to 5 mg daily, keep log of blood pressure ?- CBC with Differential/Platelet ?- COMPLETE METABOLIC PANEL WITH GFR ? ?3. Morbid obesity (HCC) ?-increase physical activity and try to eat healthy and small portions ?- Hemoglobin A1c ? ?4. Vitamin D deficiency ?-she is currently taking vitamin D supplement ?- VITAMIN D 25 Hydroxy (Vit-D Deficiency, Fractures) ? ?5. Screening for HIV (human immunodeficiency virus) ? ?- HIV Antibody (routine testing w rflx) ? ?6. Encounter for hepatitis C screening test for low risk patient ? ?- Hepatitis C antibody ? ?7. Anxiety and depression ? ?- venlafaxine XR (EFFEXOR-XR) 75 MG 24 hr capsule; Take 1 capsule (75 mg total) by mouth daily  with breakfast.  Dispense: 30 capsule; Refill: 0 ? ?8. Screening for cholesterol level ? ?- Lipid panel ? ?9. Screening for diabetes mellitus ? ?- Hemoglobin A1c ? ?10. Missed period ? ?- POCT urine pregnancy negative pregnancy test ? ?Follow up plan: ?Return in about 4 weeks (around 10/31/2021) for follow up. ? ? ? ? ? ?

## 2021-10-04 ENCOUNTER — Encounter: Payer: Self-pay | Admitting: Nurse Practitioner

## 2021-10-06 ENCOUNTER — Encounter: Payer: Self-pay | Admitting: Nurse Practitioner

## 2021-10-06 LAB — COMPLETE METABOLIC PANEL WITH GFR
AG Ratio: 1.7 (calc) (ref 1.0–2.5)
ALT: 73 U/L — ABNORMAL HIGH (ref 6–29)
AST: 46 U/L — ABNORMAL HIGH (ref 10–30)
Albumin: 4.4 g/dL (ref 3.6–5.1)
Alkaline phosphatase (APISO): 80 U/L (ref 31–125)
BUN: 16 mg/dL (ref 7–25)
CO2: 25 mmol/L (ref 20–32)
Calcium: 9.9 mg/dL (ref 8.6–10.2)
Chloride: 102 mmol/L (ref 98–110)
Creat: 0.85 mg/dL (ref 0.50–0.99)
Globulin: 2.6 g/dL (calc) (ref 1.9–3.7)
Glucose, Bld: 94 mg/dL (ref 65–99)
Potassium: 4.2 mmol/L (ref 3.5–5.3)
Sodium: 137 mmol/L (ref 135–146)
Total Bilirubin: 0.3 mg/dL (ref 0.2–1.2)
Total Protein: 7 g/dL (ref 6.1–8.1)
eGFR: 87 mL/min/{1.73_m2} (ref >=60)

## 2021-10-06 LAB — CBC WITH DIFFERENTIAL/PLATELET
Absolute Monocytes: 561 cells/uL (ref 200–950)
Basophils Absolute: 74 cells/uL (ref 0–200)
Basophils Relative: 0.8 %
Eosinophils Absolute: 202 cells/uL (ref 15–500)
Eosinophils Relative: 2.2 %
HCT: 36.3 % (ref 35.0–45.0)
Hemoglobin: 11.8 g/dL (ref 11.7–15.5)
Lymphs Abs: 2990 cells/uL (ref 850–3900)
MCH: 28.2 pg (ref 27.0–33.0)
MCHC: 32.5 g/dL (ref 32.0–36.0)
MCV: 86.8 fL (ref 80.0–100.0)
MPV: 10.3 fL (ref 7.5–12.5)
Monocytes Relative: 6.1 %
Neutro Abs: 5373 cells/uL (ref 1500–7800)
Neutrophils Relative %: 58.4 %
Platelets: 371 10*3/uL (ref 140–400)
RBC: 4.18 10*6/uL (ref 3.80–5.10)
RDW: 14.6 % (ref 11.0–15.0)
Total Lymphocyte: 32.5 %
WBC: 9.2 10*3/uL (ref 3.8–10.8)

## 2021-10-06 LAB — HEPATITIS C ANTIBODY
Hepatitis C Ab: NONREACTIVE
SIGNAL TO CUT-OFF: <0.02 (ref <1.00)

## 2021-10-06 LAB — HIV ANTIBODY (ROUTINE TESTING W REFLEX): HIV 1&2 Ab, 4th Generation: NONREACTIVE

## 2021-10-06 LAB — VITAMIN D 25 HYDROXY (VIT D DEFICIENCY, FRACTURES): Vit D, 25-Hydroxy: 33 ng/mL (ref 30–100)

## 2021-10-06 LAB — LIPID PANEL
Cholesterol: 204 mg/dL — ABNORMAL HIGH (ref <200)
HDL: 59 mg/dL (ref >=50)
LDL Cholesterol (Calc): 115 mg/dL (calc) — ABNORMAL HIGH
Non-HDL Cholesterol (Calc): 145 mg/dL (calc) — ABNORMAL HIGH (ref <130)
Total CHOL/HDL Ratio: 3.5 (calc) (ref <5.0)
Triglycerides: 182 mg/dL — ABNORMAL HIGH (ref <150)

## 2021-10-06 LAB — HEMOGLOBIN A1C
Hgb A1c MFr Bld: 5.8 % of total Hgb — ABNORMAL HIGH (ref <5.7)
Mean Plasma Glucose: 120 mg/dL
eAG (mmol/L): 6.6 mmol/L

## 2021-10-18 ENCOUNTER — Ambulatory Visit
Admission: EM | Admit: 2021-10-18 | Discharge: 2021-10-18 | Disposition: A | Discharge status: Home / Self Care | Payer: 59

## 2021-10-19 ENCOUNTER — Encounter: Payer: Self-pay | Admitting: Nurse Practitioner

## 2021-10-21 ENCOUNTER — Telehealth: Payer: Self-pay | Admitting: Nurse Practitioner

## 2021-10-21 ENCOUNTER — Other Ambulatory Visit: Payer: Self-pay | Admitting: Family Medicine

## 2021-10-21 DIAGNOSIS — I10 Essential (primary) hypertension: Secondary | ICD-10-CM

## 2021-10-21 MED ORDER — AMLODIPINE BESYLATE 5 MG PO TABS: 5.0000 mg | ORAL_TABLET | Freq: Every day | ORAL | 0 refills | Status: DC

## 2021-10-21 NOTE — Telephone Encounter (Signed)
Pt says that she was seen by provider. Pt says that her dose for Amlodipine was increased to 5MG . Pt says that her pharmacy hasn't received a new Rx for new dose.  ? ? ? ?Pharmacy:  ?[id number], [address] Phone:  [phone]  ?Fax:  [phone]  ?  ? ?Pt would like further assistance.  ?

## 2021-10-25 ENCOUNTER — Other Ambulatory Visit: Payer: Self-pay | Admitting: Nurse Practitioner

## 2021-10-25 DIAGNOSIS — F32A Depression, unspecified: Secondary | ICD-10-CM

## 2021-10-27 ENCOUNTER — Other Ambulatory Visit: Payer: Self-pay

## 2021-10-27 DIAGNOSIS — F32A Depression, unspecified: Secondary | ICD-10-CM

## 2021-10-27 NOTE — Telephone Encounter (Signed)
Requested medication (s) are due for refill today: Yes ? ?Requested medication (s) are on the active medication list: Yes ? ?Last refill:  10/03/21 ? ?Future visit scheduled: Yes ? ?Notes to clinic:  Pharmacy requesting 90 day supply and diagnosis code. ? ? ? ?Requested Prescriptions  ?Pending Prescriptions Disp Refills  ? venlafaxine XR (EFFEXOR-XR) 75 MG 24 hr capsule [Pharmacy Med Name: VENLAFAXINE HCL ER 75 MG CAP] 90 capsule 1  ?  Sig: TAKE 1 CAPSULE BY MOUTH DAILY WITH BREAKFAST.  ?  ? Psychiatry: Antidepressants - SNRI - desvenlafaxine & venlafaxine Failed - 10/25/2021 11:32 AM  ?  ?  Failed - Lipid Panel in normal range within the last 12 months  ?  Cholesterol  ?Date Value Ref Range Status  ?10/03/2021 204 (H) <200 mg/dL Final  ? ?LDL Cholesterol (Calc)  ?Date Value Ref Range Status  ?10/03/2021 115 (H) mg/dL (calc) Final  ?  Comment:  ?  Reference range: <100 ?Marland Kitchen ?Desirable range <100 mg/dL for primary prevention;   ?<70 mg/dL for patients with CHD or diabetic patients  ?with > or = 2 CHD risk factors. ?. ?LDL-C is now calculated using the Martin-Hopkins  ?calculation, which is a validated novel method providing  ?better accuracy than the Friedewald equation in the  ?estimation of LDL-C.  ?Horald Pollen et al. Lenox Ahr. 4782;956(21): 2061-2068  ?(http://education.[employer].com/faq/FAQ164) ?  ? ?HDL  ?Date Value Ref Range Status  ?10/03/2021 59 > OR = 50 mg/dL Final  ? ?Triglycerides  ?Date Value Ref Range Status  ?10/03/2021 182 (H) <150 mg/dL Final  ? ?  ?  ?  Passed - Cr in normal range and within 360 days  ?  Creat  ?Date Value Ref Range Status  ?10/03/2021 0.85 0.50 - 0.99 mg/dL Final  ?  ?  ?  ?  Passed - Completed PHQ-2 or PHQ-9 in the last 360 days  ?  ?  Passed - Last BP in normal range  ?  BP Readings from Last 1 Encounters:  ?10/03/21 136/86  ?  ?  ?  ?  Passed - Valid encounter within last 6 months  ?  Recent Outpatient Visits   ? ?      ? 3 weeks ago Migraine with aura and without status  migrainosus, not intractable  ? [facility] Berniece Salines, FNP  ? ?  ?  ?Future Appointments   ? ?        ? In 1 week Zane Herald Rudolpho Sevin, FNP [facility]  ? ?  ? ? ?  ?  ?  ? ?

## 2021-10-28 MED ORDER — VENLAFAXINE HCL ER 75 MG PO CP24: 75.0000 mg | ORAL_CAPSULE | Freq: Every day | ORAL | 0 refills | Status: DC

## 2021-11-03 ENCOUNTER — Other Ambulatory Visit: Payer: Self-pay

## 2021-11-03 ENCOUNTER — Ambulatory Visit: Payer: 59 | Admitting: Nurse Practitioner

## 2021-11-03 ENCOUNTER — Encounter: Payer: Self-pay | Admitting: Nurse Practitioner

## 2021-11-03 VITALS — BP 132/84 | HR 98 | Temp 98.3°F | Resp 16 | Ht 66.0 in | Wt 340.4 lb

## 2021-11-03 DIAGNOSIS — I10 Essential (primary) hypertension: Secondary | ICD-10-CM | POA: Diagnosis not present

## 2021-11-03 DIAGNOSIS — F419 Anxiety disorder, unspecified: Secondary | ICD-10-CM | POA: Diagnosis not present

## 2021-11-03 DIAGNOSIS — R1031 Right lower quadrant pain: Secondary | ICD-10-CM | POA: Diagnosis not present

## 2021-11-03 DIAGNOSIS — F330 Major depressive disorder, recurrent, mild: Secondary | ICD-10-CM | POA: Insufficient documentation

## 2021-11-03 DIAGNOSIS — R748 Abnormal levels of other serum enzymes: Secondary | ICD-10-CM

## 2021-11-03 MED ORDER — VENLAFAXINE HCL ER 75 MG PO CP24: 75.0000 mg | ORAL_CAPSULE | Freq: Every day | ORAL | 1 refills | Status: DC

## 2021-11-03 NOTE — Progress Notes (Signed)
? ?BP 132/84   Pulse 98   Temp 98.3 ?F (36.8 ?C) (Oral)   Resp 16   Ht 5' 6"  (1.676 m)   Wt (!) 340 lb 6.4 oz (154.4 kg)   SpO2 95%   BMI 54.94 kg/m?   ? ?Subjective:  ? ? Patient ID: Shannon Clayton, female    DOB: Dec 24, 1975, 46 y.o.   MRN: [id number] ? ?HPI: ?Shannon Clayton is a 46 y.o. female ? ?Chief Complaint  ?Patient presents with  ? Follow-up  ?  4 week recheck  ? ?HTN: Last visit on 10/03/2021 we increased her amlodipine to 5 mg daily.  Blood pressure today is 132/84.  She says she is doing well on current treatment.  She denies any chest pain, shortness of breath, headaches, or blurred vision.  Continue with current treatment plan. ? ?Anxiety/Depression: Last visit on 10/03/2021 we increased her Effexor to 75 mg daily.  She says that she has noticed that she is doing much better with the increased dose of Effexor.  She denies any suicidal thoughts.  We will continue at current dose. ? ?  11/03/2021  ?  2:59 PM 10/03/2021  ?  2:30 PM 05/26/2019  ?  3:55 PM  ?Depression screen PHQ 2/9  ?Decreased Interest 0 0 2  ?Down, Depressed, Hopeless 0 0 2  ?PHQ - 2 Score 0 0 4  ?Altered sleeping  2 3  ?Tired, decreased energy  1 2  ?Change in appetite  0 2  ?Feeling bad or failure about yourself   0 3  ?Trouble concentrating  0 0  ?Moving slowly or fidgety/restless  0 1  ?Suicidal thoughts  0 0  ?PHQ-9 Score  3 15  ?Difficult doing work/chores   Somewhat difficult  ? ? ?  10/03/2021  ?  3:02 PM 05/26/2019  ?  3:56 PM  ?GAD 7 : Generalized Anxiety Score  ?Nervous, Anxious, on Edge 3 3  ?Control/stop worrying 3 3  ?Worry too much - different things 2 3  ?Trouble relaxing 0 3  ?Restless 0 2  ?Easily annoyed or irritable 1 3  ?Afraid - awful might happen 1 3  ?Total GAD 7 Score 10 20  ?Anxiety Difficulty Not difficult at all Somewhat difficult  ? ?Abnormal liver enzymes: On 10/03/2021 her AST was 46 her ALT was 73.  We will repeat lab results.  If still elevated will add on hepatitis panel.  ? ?RLQ abdominal  pain: She says she has had this right lower quadrant abdominal pain for several months now.  She describes the pain as sharp and burning.  The pain comes and goes.  She says she notices it mostly when she is walking.  The first couple of steps.  She denies any fever, nausea, vomiting, diarrhea, constipation, dysuria, vaginal discharge, or urinary frequency.  Abdominal exam was benign.  Will get CT scan to check for hernia.  Patient is agreeable to plan. ? ?Relevant past medical, surgical, family and social history reviewed and updated as indicated. Interim medical history since our last visit reviewed. ?Allergies and medications reviewed and updated. ? ?Review of Systems ? ?Constitutional: Negative for fever or weight change.  ?Respiratory: Negative for cough and shortness of breath.   ?Cardiovascular: Negative for chest pain or palpitations.  ?Gastrointestinal: positive for abdominal pain, no bowel changes.  ?Musculoskeletal: Negative for gait problem or joint swelling.  ?Skin: Negative for rash.  ?Neurological: Negative for dizziness or headache.  ?No other specific complaints in  a complete review of systems (except as listed in HPI above).  ? ?   ?Objective:  ?  ?BP 132/84   Pulse 98   Temp 98.3 ?F (36.8 ?C) (Oral)   Resp 16   Ht 5' 6"  (1.676 m)   Wt (!) 340 lb 6.4 oz (154.4 kg)   SpO2 95%   BMI 54.94 kg/m?   ?Wt Readings from Last 3 Encounters:  ?11/03/21 (!) 340 lb 6.4 oz (154.4 kg)  ?10/03/21 (!) 345 lb (156.5 kg)  ?02/21/21 250 lb (113.4 kg)  ?  ?Physical Exam ? ?Constitutional: Patient appears well-developed and well-nourished. Obese  No distress.  ?HEENT: head atraumatic, normocephalic, pupils equal and reactive to light, neck supple ?Cardiovascular: Normal rate, regular rhythm and normal heart sounds.  No murmur heard. No BLE edema. ?Pulmonary/Chest: Effort normal and breath sounds normal. No respiratory distress. ?Abdominal: Soft.  There is no tenderness. ?Psychiatric: Patient has a normal mood and  affect. behavior is normal. Judgment and thought content normal.  ? ?Results for orders placed or performed in visit on 10/03/21  ?Lipid panel  ?Result Value Ref Range  ? Cholesterol 204 (H) <200 mg/dL  ? HDL 59 > OR = 50 mg/dL  ? Triglycerides 182 (H) <150 mg/dL  ? LDL Cholesterol (Calc) 115 (H) mg/dL (calc)  ? Total CHOL/HDL Ratio 3.5 <5.0 (calc)  ? Non-HDL Cholesterol (Calc) 145 (H) <130 mg/dL (calc)  ?CBC with Differential/Platelet  ?Result Value Ref Range  ? WBC 9.2 3.8 - 10.8 Thousand/uL  ? RBC 4.18 3.80 - 5.10 Million/uL  ? Hemoglobin 11.8 11.7 - 15.5 g/dL  ? HCT 36.3 35.0 - 45.0 %  ? MCV 86.8 80.0 - 100.0 fL  ? MCH 28.2 27.0 - 33.0 pg  ? MCHC 32.5 32.0 - 36.0 g/dL  ? RDW 14.6 11.0 - 15.0 %  ? Platelets 371 140 - 400 Thousand/uL  ? MPV 10.3 7.5 - 12.5 fL  ? Neutro Abs 5,373 1,500 - 7,800 cells/uL  ? Lymphs Abs 2,990 850 - 3,900 cells/uL  ? Absolute Monocytes 561 200 - 950 cells/uL  ? Eosinophils Absolute 202 15 - 500 cells/uL  ? Basophils Absolute 74 0 - 200 cells/uL  ? Neutrophils Relative % 58.4 %  ? Total Lymphocyte 32.5 %  ? Monocytes Relative 6.1 %  ? Eosinophils Relative 2.2 %  ? Basophils Relative 0.8 %  ?COMPLETE METABOLIC PANEL WITH GFR  ?Result Value Ref Range  ? Glucose, Bld 94 65 - 99 mg/dL  ? BUN 16 7 - 25 mg/dL  ? Creat 0.85 0.50 - 0.99 mg/dL  ? eGFR 87 > OR = 60 mL/min/1.21m  ? BUN/Creatinine Ratio NOT APPLICABLE 6 - 22 (calc)  ? Sodium 137 135 - 146 mmol/L  ? Potassium 4.2 3.5 - 5.3 mmol/L  ? Chloride 102 98 - 110 mmol/L  ? CO2 25 20 - 32 mmol/L  ? Calcium 9.9 8.6 - 10.2 mg/dL  ? Total Protein 7.0 6.1 - 8.1 g/dL  ? Albumin 4.4 3.6 - 5.1 g/dL  ? Globulin 2.6 1.9 - 3.7 g/dL (calc)  ? AG Ratio 1.7 1.0 - 2.5 (calc)  ? Total Bilirubin 0.3 0.2 - 1.2 mg/dL  ? Alkaline phosphatase (APISO) 80 31 - 125 U/L  ? AST 46 (H) 10 - 30 U/L  ? ALT 73 (H) 6 - 29 U/L  ?VITAMIN D 25 Hydroxy (Vit-D Deficiency, Fractures)  ?Result Value Ref Range  ? Vit D, 25-Hydroxy 33 30 - 100 ng/mL  ?Hemoglobin  A1c  ?Result Value  Ref Range  ? Hgb A1c MFr Bld 5.8 (H) <5.7 % of total Hgb  ? Mean Plasma Glucose 120 mg/dL  ? eAG (mmol/L) 6.6 mmol/L  ?Hepatitis C antibody  ?Result Value Ref Range  ? Hepatitis C Ab NON-REACTIVE NON-REACTIVE  ? SIGNAL TO CUT-OFF <0.02 <1.00  ?HIV Antibody (routine testing w rflx)  ?Result Value Ref Range  ? HIV 1&2 Ab, 4th Generation NON-REACTIVE NON-REACTIVE  ?POCT urine pregnancy  ?Result Value Ref Range  ? Preg Test, Ur Negative Negative  ? ?   ?Assessment & Plan:  ? ?1. Essential hypertension ?Continue taking amlodipine 5 mg daily ? ?2. Anxiety ?Continue taking Effexor 75 mg daily ?- venlafaxine XR (EFFEXOR-XR) 75 MG 24 hr capsule; Take 1 capsule (75 mg total) by mouth daily with breakfast.  Dispense: 90 capsule; Refill: 1  ? ?3. Mild episode of recurrent major depressive disorder ([place]) ?Continue taking Effexor 75 mg daily ?- venlafaxine XR (EFFEXOR-XR) 75 MG 24 hr capsule; Take 1 capsule (75 mg total) by mouth daily with breakfast.  Dispense: 90 capsule; Refill: 1  ? ?4. Right lower quadrant abdominal pain ? ?- CT ABDOMEN PELVIS W WO CONTRAST; Future ? ?5. Abnormal liver enzymes ? ?- COMPLETE METABOLIC PANEL WITH GFR ? ? ?Follow up plan: ?Return in about 4 months (around 03/06/2022) for follow up. ? ? ? ? ? ?

## 2021-11-04 ENCOUNTER — Other Ambulatory Visit: Payer: Self-pay | Admitting: Nurse Practitioner

## 2021-11-04 ENCOUNTER — Telehealth: Payer: Self-pay

## 2021-11-04 ENCOUNTER — Encounter: Payer: Self-pay | Admitting: Nurse Practitioner

## 2021-11-04 DIAGNOSIS — R1031 Right lower quadrant pain: Secondary | ICD-10-CM

## 2021-11-04 DIAGNOSIS — R748 Abnormal levels of other serum enzymes: Secondary | ICD-10-CM

## 2021-11-04 NOTE — Telephone Encounter (Signed)
Copied from CRM [id number]. Topic: General - Other >> Nov 04, 2021 12:49 PM Traci Sermon wrote: Reason for CRM: Joe from [facility] called in stating the order that was sent over, they can not do, they require a CT be changed into [facility] first, his cb number is [phone], please advise.

## 2021-11-04 NOTE — Telephone Encounter (Signed)
Informed patient that per her insurance, [facility], will not cover a CT without doing an ultrasound first.  Patient was informed that scheduling will contact to schedule.  Pt verbalized understanding.

## 2021-11-04 NOTE — Telephone Encounter (Signed)
Please advise or put in new order ?

## 2021-11-05 ENCOUNTER — Other Ambulatory Visit: Payer: Self-pay | Admitting: Nurse Practitioner

## 2021-11-05 DIAGNOSIS — R1031 Right lower quadrant pain: Secondary | ICD-10-CM

## 2021-11-05 LAB — TEST AUTHORIZATION

## 2021-11-05 LAB — COMPLETE METABOLIC PANEL WITH GFR
AG Ratio: 1.6 (calc) (ref 1.0–2.5)
ALT: 73 U/L — ABNORMAL HIGH (ref 6–29)
AST: 37 U/L — ABNORMAL HIGH (ref 10–30)
Albumin: 4.3 g/dL (ref 3.6–5.1)
Alkaline phosphatase (APISO): 81 U/L (ref 31–125)
BUN: 9 mg/dL (ref 7–25)
CO2: 26 mmol/L (ref 20–32)
Calcium: 9.5 mg/dL (ref 8.6–10.2)
Chloride: 103 mmol/L (ref 98–110)
Creat: 0.82 mg/dL (ref 0.50–0.99)
Globulin: 2.7 g/dL (calc) (ref 1.9–3.7)
Glucose, Bld: 89 mg/dL (ref 65–99)
Potassium: 4.2 mmol/L (ref 3.5–5.3)
Sodium: 139 mmol/L (ref 135–146)
Total Bilirubin: 0.3 mg/dL (ref 0.2–1.2)
Total Protein: 7 g/dL (ref 6.1–8.1)
eGFR: 91 mL/min/{1.73_m2} (ref >=60)

## 2021-11-05 LAB — HEPATITIS PANEL, ACUTE
Hep A IgM: NONREACTIVE
Hep B C IgM: NONREACTIVE
Hepatitis B Surface Ag: NONREACTIVE
Hepatitis C Ab: NONREACTIVE
SIGNAL TO CUT-OFF: 0.07 (ref <1.00)

## 2021-11-17 ENCOUNTER — Ambulatory Visit: Payer: 59 | Attending: Nurse Practitioner

## 2021-12-03 ENCOUNTER — Other Ambulatory Visit: Payer: 59

## 2021-12-15 ENCOUNTER — Encounter: Payer: Self-pay | Admitting: Nurse Practitioner

## 2022-01-12 ENCOUNTER — Other Ambulatory Visit: Payer: Self-pay | Admitting: Family Medicine

## 2022-01-12 DIAGNOSIS — I10 Essential (primary) hypertension: Secondary | ICD-10-CM

## 2022-03-06 ENCOUNTER — Other Ambulatory Visit: Payer: Self-pay

## 2022-03-06 ENCOUNTER — Encounter: Payer: Self-pay | Admitting: Nurse Practitioner

## 2022-03-06 ENCOUNTER — Ambulatory Visit: Payer: 59 | Admitting: Nurse Practitioner

## 2022-03-06 VITALS — BP 132/84 | HR 97 | Temp 98.3°F | Resp 16 | Ht 66.0 in | Wt 329.9 lb

## 2022-03-06 DIAGNOSIS — R7303 Prediabetes: Secondary | ICD-10-CM

## 2022-03-06 DIAGNOSIS — F419 Anxiety disorder, unspecified: Secondary | ICD-10-CM | POA: Diagnosis not present

## 2022-03-06 DIAGNOSIS — F330 Major depressive disorder, recurrent, mild: Secondary | ICD-10-CM

## 2022-03-06 DIAGNOSIS — I10 Essential (primary) hypertension: Secondary | ICD-10-CM

## 2022-03-06 DIAGNOSIS — G43109 Migraine with aura, not intractable, without status migrainosus: Secondary | ICD-10-CM

## 2022-03-06 DIAGNOSIS — E559 Vitamin D deficiency, unspecified: Secondary | ICD-10-CM

## 2022-03-06 DIAGNOSIS — E785 Hyperlipidemia, unspecified: Secondary | ICD-10-CM

## 2022-03-06 MED ORDER — BUSPIRONE HCL 5 MG PO TABS: 5.0000 mg | ORAL_TABLET | Freq: Three times a day (TID) | ORAL | 0 refills | Status: DC | PRN

## 2022-03-06 NOTE — Progress Notes (Signed)
BP 132/84   Pulse 97   Temp 98.3 F (36.8 C) (Oral)   Resp 16   Ht 5' 6"  (1.676 m)   Wt (!) 329 lb 14.4 oz (149.6 kg)   SpO2 98%   BMI 53.25 kg/m    Subjective:    Patient ID: Shannon Clayton, female    DOB: 1976-06-14, 46 y.o.   MRN: [id number]  HPI: Shannon Clayton is a 46 y.o. female, here alone  Chief Complaint  Patient presents with   Hypertension    4 month follow up    Migraines: Patient reports she has not had a migraine in awhile.  She says she does get auras with her migraines.  She is currently treating with Fioricet 50-325-40 milligrams as needed.   HTN: Blood pressure today is 132/84.  She is currently taking amlodipine 5 mg daily.  Patient denies any chest pain, shortness of breath, or blurred vision.  Morbid obesity: Patient is currently doing weight watchers.  At her last appointment she weighed 340 pounds today she weighs 329 pounds with a BMI 53.25.   Vitamin D deficiency: Patient is currently taking vitamin D supplementation.  Her last vitamin D on 10/03/2021 was 33.  Hyperlipidemia: Her last LDL was 115 on 10/03/2021.  Patient is currently working on lifestyle modification.  She is working with [employer]. The 10-year ASCVD risk score (Arnett DK, et al., 2019) is: 1%   Values used to calculate the score:     Age: 61 years     Sex: Female     Is Non-Hispanic African American: No     Diabetic: No     Tobacco smoker: No     Systolic Blood Pressure: 504 mmHg     Is BP treated: Yes     HDL Cholesterol: 59 mg/dL     Total Cholesterol: 204 mg/dL   prediabetes: Her last A1c was 5.8 on 10/03/2021.  He is working on lifestyle modifications.  She is working on losing weight using [employer].  Depression and anxiety: She is currently taking Effexor 75 mg daily.  She reports she is doing well.  Her PHQ-9 score is negative.     03/06/2022    3:12 PM 11/03/2021    2:59 PM 10/03/2021    2:30 PM 05/26/2019    3:55 PM  Depression screen PHQ 2/9   Decreased Interest 0 0 0 2  Down, Depressed, Hopeless 0 0 0 2  PHQ - 2 Score 0 0 0 4  Altered sleeping 2  2 3   Tired, decreased energy 0  1 2  Change in appetite 0  0 2  Feeling bad or failure about yourself  1  0 3  Trouble concentrating 0  0 0  Moving slowly or fidgety/restless 0  0 1  Suicidal thoughts 0  0 0  PHQ-9 Score 3  3 15   Difficult doing work/chores Not difficult at all   Somewhat difficult       03/06/2022    3:12 PM 10/03/2021    3:02 PM 05/26/2019    3:56 PM  GAD 7 : Generalized Anxiety Score  Nervous, Anxious, on Edge 2 3 3   Control/stop worrying 2 3 3   Worry too much - different things 2 2 3   Trouble relaxing 1 0 3  Restless 1 0 2  Easily annoyed or irritable 0 1 3  Afraid - awful might happen 0 1 3  Total GAD 7 Score 8 10 20  Anxiety Difficulty Not difficult at all Not difficult at all Somewhat difficult    Relevant past medical, surgical, family and social history reviewed and updated as indicated. Interim medical history since our last visit reviewed. Allergies and medications reviewed and updated.  Review of Systems  Constitutional: Negative for fever or weight change.  Respiratory: Negative for cough and shortness of breath.   Cardiovascular: Negative for chest pain or palpitations.  Gastrointestinal: Negative for abdominal pain, no bowel changes.  Musculoskeletal: Negative for gait problem or joint swelling.  Skin: Negative for rash.  Neurological: Negative for dizziness or headache.  No other specific complaints in a complete review of systems (except as listed in HPI above).      Objective:    BP 132/84   Pulse 97   Temp 98.3 F (36.8 C) (Oral)   Resp 16   Ht 5' 6"  (1.676 m)   Wt (!) 329 lb 14.4 oz (149.6 kg)   SpO2 98%   BMI 53.25 kg/m   Wt Readings from Last 3 Encounters:  03/06/22 (!) 329 lb 14.4 oz (149.6 kg)  11/03/21 (!) 340 lb 6.4 oz (154.4 kg)  10/03/21 (!) 345 lb (156.5 kg)    Physical Exam  Constitutional: Patient  appears well-developed and well-nourished. Obese  No distress.  HEENT: head atraumatic, normocephalic, pupils equal and reactive to light,  neck supple Cardiovascular: Normal rate, regular rhythm and normal heart sounds.  No murmur heard. No BLE edema. Pulmonary/Chest: Effort normal and breath sounds normal. No respiratory distress. Abdominal: Soft.  There is no tenderness. Psychiatric: Patient has a normal mood and affect. behavior is normal. Judgment and thought content normal.   Results for orders placed or performed in visit on 11/03/21  COMPLETE METABOLIC PANEL WITH GFR  Result Value Ref Range   Glucose, Bld 89 65 - 99 mg/dL   BUN 9 7 - 25 mg/dL   Creat 0.82 0.50 - 0.99 mg/dL   eGFR 91 > OR = 60 mL/min/1.26m   BUN/Creatinine Ratio NOT APPLICABLE 6 - 22 (calc)   Sodium 139 135 - 146 mmol/L   Potassium 4.2 3.5 - 5.3 mmol/L   Chloride 103 98 - 110 mmol/L   CO2 26 20 - 32 mmol/L   Calcium 9.5 8.6 - 10.2 mg/dL   Total Protein 7.0 6.1 - 8.1 g/dL   Albumin 4.3 3.6 - 5.1 g/dL   Globulin 2.7 1.9 - 3.7 g/dL (calc)   AG Ratio 1.6 1.0 - 2.5 (calc)   Total Bilirubin 0.3 0.2 - 1.2 mg/dL   Alkaline phosphatase (APISO) 81 31 - 125 U/L   AST 37 (H) 10 - 30 U/L   ALT 73 (H) 6 - 29 U/L  Hepatitis panel, acute  Result Value Ref Range   Hep A IgM NON-REACTIVE NON-REACTIVE   Hepatitis B Surface Ag NON-REACTIVE NON-REACTIVE   Hep B C IgM NON-REACTIVE NON-REACTIVE   Hepatitis C Ab NON-REACTIVE NON-REACTIVE   SIGNAL TO CUT-OFF 0.07 <1.00  TEST AUTHORIZATION  Result Value Ref Range   TEST NAME: HEPATITIS PANEL, ACUTE W/REFL    TEST CODE: [id number]    CLIENT CONTACT: LESLIE SMITH    REPORT ALWAYS MESSAGE SIGNATURE        Assessment & Plan:   1. Migraine with aura and without status migrainosus, not intractable -consider other treatment options like we discussed - butalbital-acetaminophen-caffeine (FIORICET) 50-325-40 MG tablet; Take 1-2 tablets by mouth daily as needed for headache. Limit  use no more 2x per week  Dispense: 30 tablet; Refill: 2  2. Essential hypertension -increase amlodipine to 5 mg daily, keep log of blood pressure - CBC with Differential/Platelet - COMPLETE METABOLIC PANEL WITH GFR  3. Morbid obesity ([place]) -increase physical activity and try to eat healthy and small portions - Hemoglobin A1c  4. Vitamin D deficiency -she is currently taking vitamin D supplement - VITAMIN D 25 Hydroxy (Vit-D Deficiency, Fractures)  5. Screening for HIV (human immunodeficiency virus)  - HIV Antibody (routine testing w rflx)  6. Encounter for hepatitis C screening test for low risk patient  - Hepatitis C antibody  7. Anxiety and depression  - venlafaxine XR (EFFEXOR-XR) 75 MG 24 hr capsule; Take 1 capsule (75 mg total) by mouth daily with breakfast.  Dispense: 30 capsule; Refill: 0  8. Screening for cholesterol level  - Lipid panel  9. Screening for diabetes mellitus  - Hemoglobin A1c  10. Missed period  - POCT urine pregnancy negative pregnancy test  Follow up plan: Return in about 6 months (around 09/04/2022) for follow up.

## 2022-03-07 LAB — COMPLETE METABOLIC PANEL WITH GFR
AG Ratio: 1.8 (calc) (ref 1.0–2.5)
ALT: 48 U/L — ABNORMAL HIGH (ref 6–29)
AST: 31 U/L — ABNORMAL HIGH (ref 10–30)
Albumin: 4.2 g/dL (ref 3.6–5.1)
Alkaline phosphatase (APISO): 69 U/L (ref 31–125)
BUN: 19 mg/dL (ref 7–25)
CO2: 25 mmol/L (ref 20–32)
Calcium: 9.3 mg/dL (ref 8.6–10.2)
Chloride: 105 mmol/L (ref 98–110)
Creat: 0.85 mg/dL (ref 0.50–0.99)
Globulin: 2.3 g/dL (calc) (ref 1.9–3.7)
Glucose, Bld: 86 mg/dL (ref 65–99)
Potassium: 4.3 mmol/L (ref 3.5–5.3)
Sodium: 138 mmol/L (ref 135–146)
Total Bilirubin: 0.4 mg/dL (ref 0.2–1.2)
Total Protein: 6.5 g/dL (ref 6.1–8.1)
eGFR: 87 mL/min/{1.73_m2} (ref >=60)

## 2022-03-07 LAB — CBC WITH DIFFERENTIAL/PLATELET
Absolute Monocytes: 435 cells/uL (ref 200–950)
Basophils Absolute: 48 cells/uL (ref 0–200)
Basophils Relative: 0.7 %
Eosinophils Absolute: 156 cells/uL (ref 15–500)
Eosinophils Relative: 2.3 %
HCT: 38.3 % (ref 35.0–45.0)
Hemoglobin: 12.9 g/dL (ref 11.7–15.5)
Lymphs Abs: 2353 cells/uL (ref 850–3900)
MCH: 29.1 pg (ref 27.0–33.0)
MCHC: 33.7 g/dL (ref 32.0–36.0)
MCV: 86.3 fL (ref 80.0–100.0)
MPV: 9.9 fL (ref 7.5–12.5)
Monocytes Relative: 6.4 %
Neutro Abs: 3808 cells/uL (ref 1500–7800)
Neutrophils Relative %: 56 %
Platelets: 346 10*3/uL (ref 140–400)
RBC: 4.44 10*6/uL (ref 3.80–5.10)
RDW: 13.6 % (ref 11.0–15.0)
Total Lymphocyte: 34.6 %
WBC: 6.8 10*3/uL (ref 3.8–10.8)

## 2022-03-07 LAB — HEMOGLOBIN A1C
Hgb A1c MFr Bld: 5.6 % of total Hgb (ref <5.7)
Mean Plasma Glucose: 114 mg/dL
eAG (mmol/L): 6.3 mmol/L

## 2022-03-07 LAB — LIPID PANEL
Cholesterol: 162 mg/dL (ref <200)
HDL: 44 mg/dL — ABNORMAL LOW (ref >=50)
LDL Cholesterol (Calc): 94 mg/dL (calc)
Non-HDL Cholesterol (Calc): 118 mg/dL (calc) (ref <130)
Total CHOL/HDL Ratio: 3.7 (calc) (ref <5.0)
Triglycerides: 142 mg/dL (ref <150)

## 2022-03-10 ENCOUNTER — Encounter: Payer: Self-pay | Admitting: Nurse Practitioner

## 2022-03-30 ENCOUNTER — Other Ambulatory Visit: Payer: Self-pay | Admitting: Nurse Practitioner

## 2022-03-30 DIAGNOSIS — F419 Anxiety disorder, unspecified: Secondary | ICD-10-CM

## 2022-03-31 NOTE — Telephone Encounter (Signed)
Requested medication (s) are due for refill today: yes   Requested medication (s) are on the active medication list:yes  Last refill:  03/06/22  Future visit scheduled: yes  Notes to clinic:  Unable to refill per protocol, last refill by provider 03/06/22 for 30 days.Pharmacy request:REQUEST FOR 90 DAYS PRESCRIPTION. DX Code Needed. Routing for approval.     Requested Prescriptions  Pending Prescriptions Disp Refills   busPIRone (BUSPAR) 5 MG tablet [Pharmacy Med Name: BUSPIRONE HCL 5 MG TABLET] 270 tablet 1    Sig: TAKE 1 TABLET BY MOUTH 3 TIMES DAILY AS NEEDED.     Psychiatry: Anxiolytics/Hypnotics - Non-controlled Passed - 03/30/2022 11:31 AM      Passed - Valid encounter within last 12 months    Recent Outpatient Visits           3 weeks ago Essential hypertension   [place], FNP   4 months ago Essential hypertension   [place], FNP   5 months ago Migraine with aura and without status migrainosus, not intractable   [facility] Bo Merino, FNP       Future Appointments             In 3 months Reece Packer, Myna Hidalgo, [facility], [facility]

## 2022-04-21 ENCOUNTER — Ambulatory Visit: Payer: 59 | Admitting: Podiatry

## 2022-04-21 DIAGNOSIS — L603 Nail dystrophy: Secondary | ICD-10-CM

## 2022-04-25 IMAGING — MG MM DIGITAL SCREENING BILAT W/ TOMO AND CAD
8 series · 8 of 24 positions shown · non-contrast
Comparison: Previous exam(s).

CLINICAL DATA: Screening.

EXAM:
DIGITAL SCREENING BILATERAL MAMMOGRAM WITH TOMOSYNTHESIS AND CAD
TECHNIQUE: Bilateral screening digital craniocaudal and mediolateral oblique
mammograms were obtained. Bilateral screening digital breast
tomosynthesis was performed. The images were evaluated with
computer-aided detection.

[L CC synth-2D]
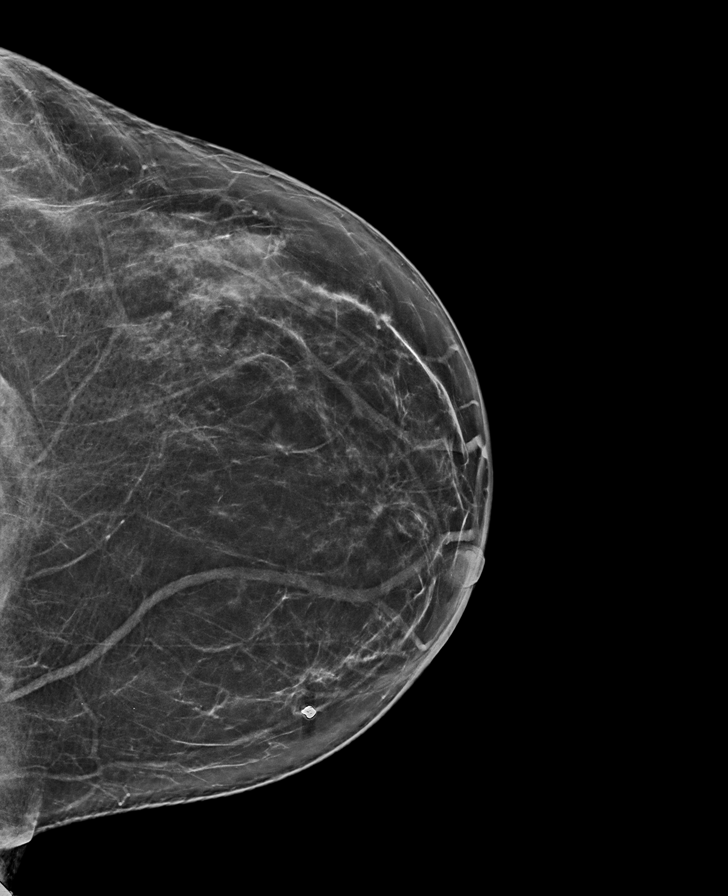

[L MLO synth-2D]
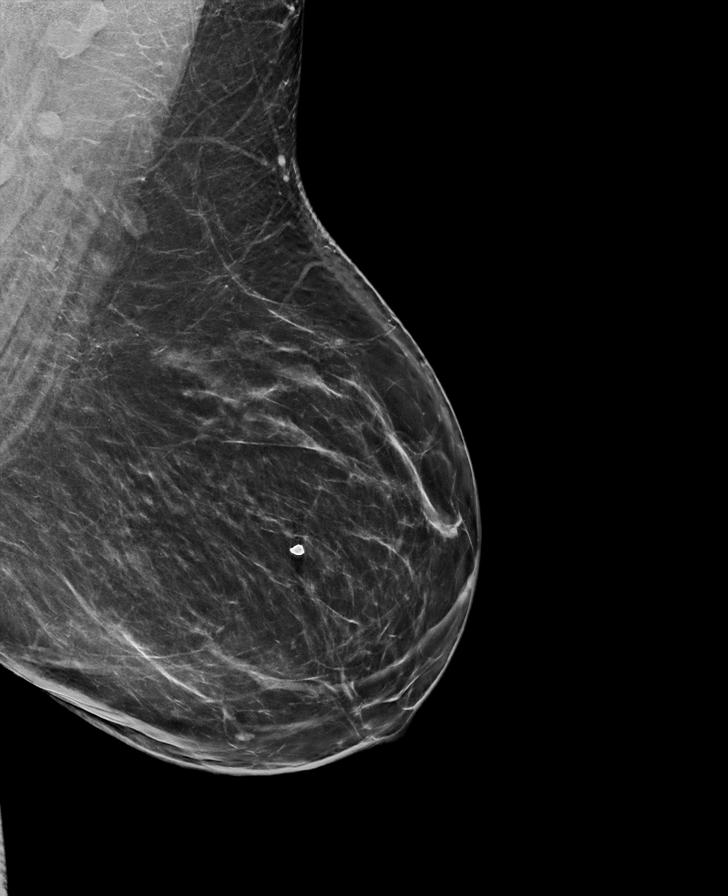

[R MLO synth-2D]
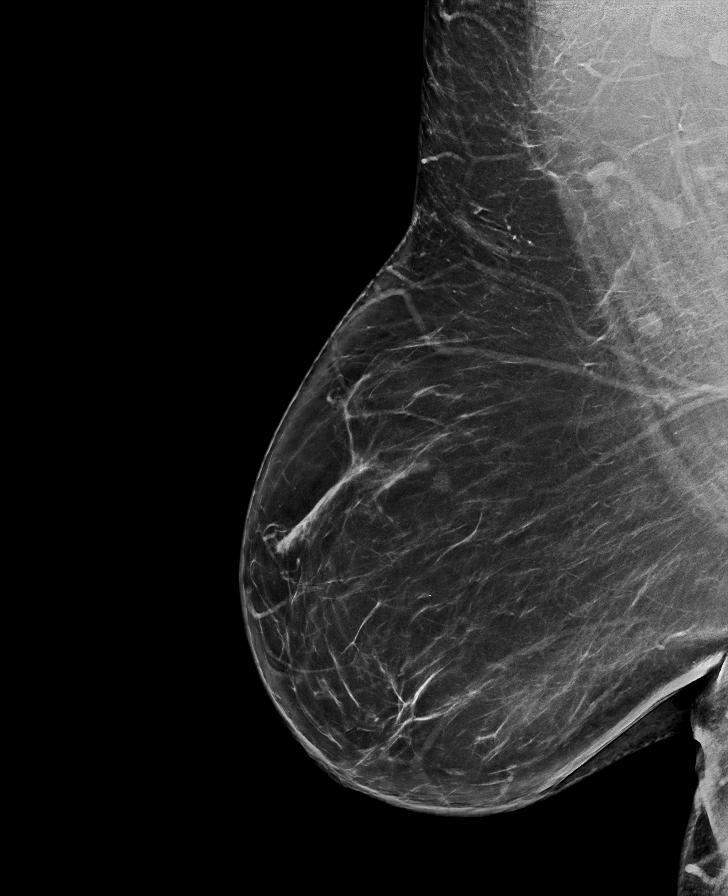

[R CC synth-2D]
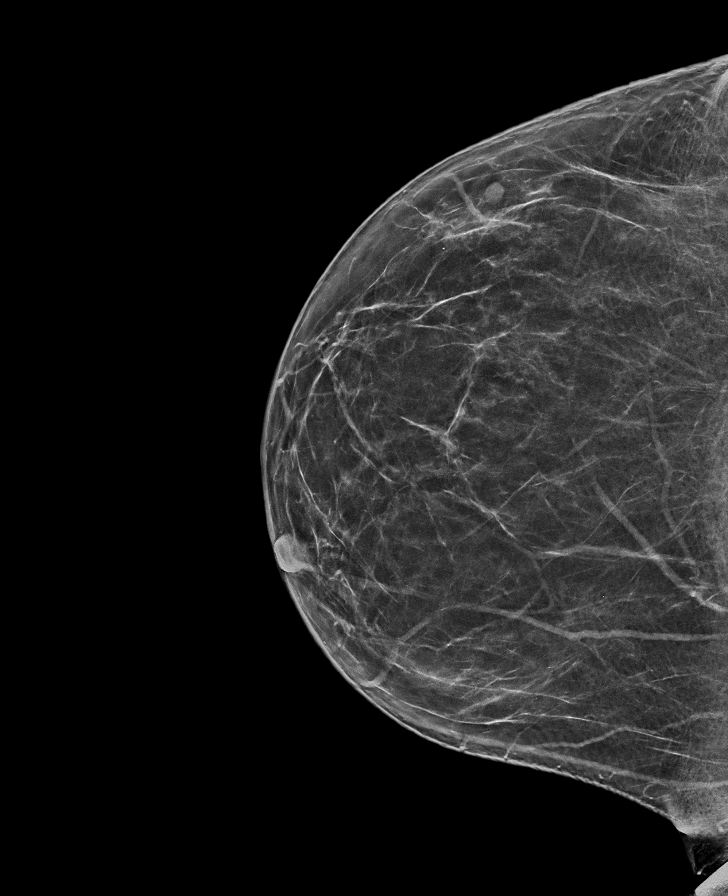

[L MLO tomo · tomo slice 44/87.0]
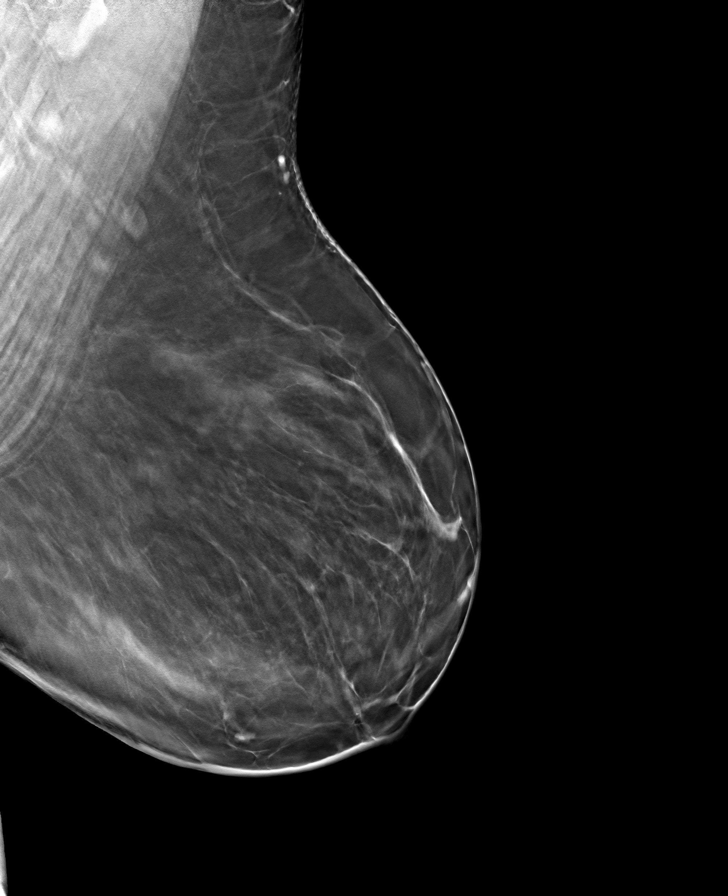

[R CC tomo · tomo slice 35/70.0]
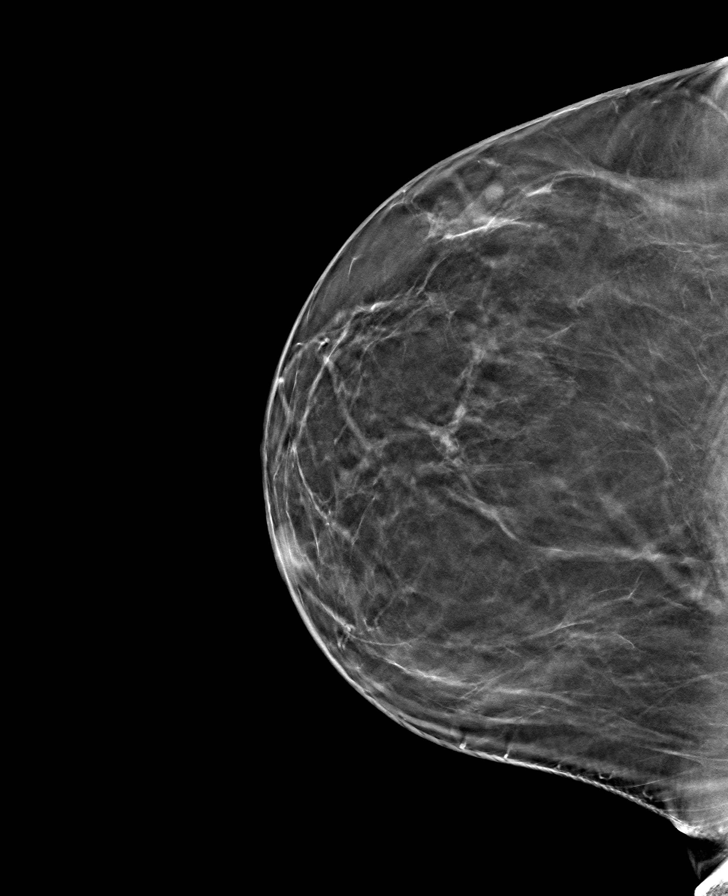

[L CC tomo · tomo slice 39/77.0]
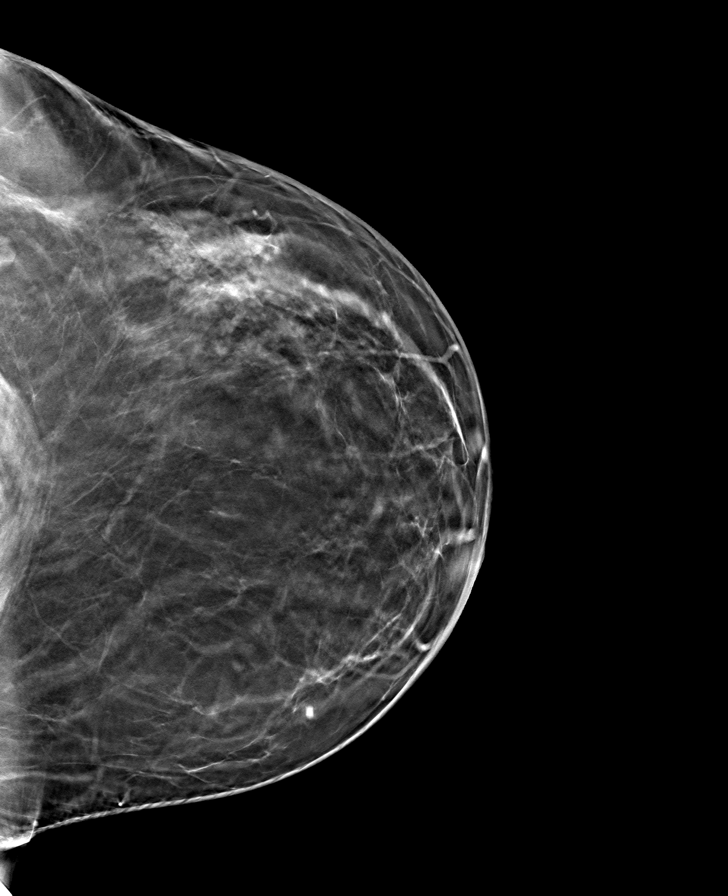

[R MLO tomo · tomo slice 47/93.0]
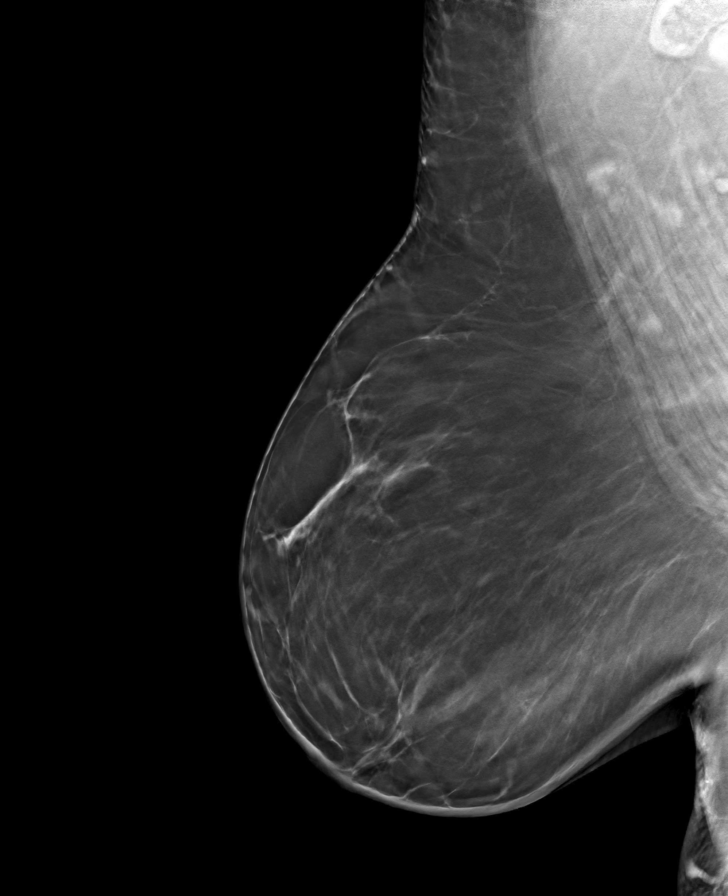

[8 of 24 positions shown; findings below may reference images not displayed]

ACR Breast Density Category b: There are scattered areas of
fibroglandular density.
FINDINGS: There are no findings suspicious for malignancy.
IMPRESSION: No mammographic evidence of malignancy. A result letter of this
screening mammogram will be mailed directly to the patient.

RECOMMENDATION:
Screening mammogram in one year. (Code:51-O-LD2)

BI-RADS CATEGORY  1: Negative.

## 2022-04-28 ENCOUNTER — Encounter: Payer: Self-pay | Admitting: Podiatry

## 2022-04-28 NOTE — Progress Notes (Signed)
Subjective:  Patient ID: Shannon Clayton, female    DOB: Sep 08, 1975,  MRN: [id number]  Chief Complaint  Patient presents with   Nail Problem    46 y.o. female presents with the above complaint.  Patient presents with complaint of left hallux nail dystrophy.  Patient stated has been present for quite some time is progressive gotten worse hurts with ambulation hurts with pressure.  She would like to have it removed and made permanent.  She states that she is tired of dealing with this now.  Pain scale is 7 out of 10 hurts with ambulation hurts with pressure.   Review of Systems: Negative except as noted in the HPI. Denies N/V/F/Ch.  Past Medical History:  Diagnosis Date   Depression    Frequent headaches    Hypertension    Migraine headache with aura     Current Outpatient Medications:    amLODipine (NORVASC) 5 MG tablet, TAKE 1 TABLET (5 MG TOTAL) BY MOUTH DAILY., Disp: 30 tablet, Rfl: 2   busPIRone (BUSPAR) 5 MG tablet, TAKE 1 TABLET BY MOUTH 3 TIMES DAILY AS NEEDED., Disp: 270 tablet, Rfl: 1   butalbital-acetaminophen-caffeine (FIORICET) 50-325-40 MG tablet, Take 1-2 tablets by mouth daily as needed for headache. Limit use no more 2x per week, Disp: 30 tablet, Rfl: 2   co-enzyme Q-10 30 MG capsule, Take 30 mg by mouth 3 (three) times daily., Disp: , Rfl:    MAGNESIUM PO, Take by mouth., Disp: , Rfl:    Omega-3 Fatty Acids (FISH OIL OMEGA-3 PO), Take by mouth., Disp: , Rfl:    venlafaxine XR (EFFEXOR-XR) 75 MG 24 hr capsule, Take 1 capsule (75 mg total) by mouth daily with breakfast., Disp: 90 capsule, Rfl: 1  Social History   Tobacco Use  Smoking Status Never  Smokeless Tobacco Never    Allergies  Allergen Reactions   Sumatriptan Other (See Comments)    myalgias Myalgias; makes shoulders and neck hurt   Objective:  There were no vitals filed for this visit. There is no height or weight on file to calculate BMI. Constitutional Well developed. Well nourished.   Vascular Dorsalis pedis pulses palpable bilaterally. Posterior tibial pulses palpable bilaterally. Capillary refill normal to all digits.  No cyanosis or clubbing noted. Pedal hair growth normal.  Neurologic Normal speech. Oriented to person, place, and time. Epicritic sensation to light touch grossly present bilaterally.  Dermatologic Pain on palpation of the entire/total nail on 1st digit of the left No other open wounds. No skin lesions.  Orthopedic: Normal joint ROM without pain or crepitus bilaterally. No visible deformities. No bony tenderness.   Radiographs: None Assessment:   1. Nail dystrophy    Plan:  Patient was evaluated and treated and all questions answered.  Nail contusion/dystrophy hallux, left -Patient elects to proceed with minor surgery to remove entire toenail today. Consent reviewed and signed by patient. -Entire/total nail excised. See procedure note. -Educated on post-procedure care including soaking. Written instructions provided and reviewed. -Patient to follow up in 2 weeks for nail check.  Procedure: Excision of entire/total nail with phenol matricectomy Location: Left 1st toe digit Anesthesia: Lidocaine 1% plain; 1.5 mL and Marcaine 0.5% plain; 1.5 mL, digital block. Skin Prep: Betadine. Dressing: Silvadene; telfa; dry, sterile, compression dressing. Technique: Following skin prep, the toe was exsanguinated and a tourniquet was secured at the base of the toe. The affected nail border was freed and excised. The tourniquet was then removed and sterile dressing applied. Disposition: Patient tolerated  procedure well. Patient to return in 2 weeks for follow-up.   No follow-ups on file.;

## 2022-05-01 ENCOUNTER — Telehealth: Payer: Self-pay | Admitting: *Deleted

## 2022-05-01 NOTE — Telephone Encounter (Signed)
Patient is calling because her post procedural toe is draining, a little painful, little redness, has only been soaking once daily, encouraged to follow the soaking instructions provided.  Please advise.

## 2022-05-04 MED ORDER — DOXYCYCLINE HYCLATE 100 MG PO TABS: 100.0000 mg | ORAL_TABLET | Freq: Two times a day (BID) | ORAL | 0 refills | Status: DC

## 2022-05-08 ENCOUNTER — Other Ambulatory Visit: Payer: Self-pay | Admitting: Nurse Practitioner

## 2022-05-08 DIAGNOSIS — I10 Essential (primary) hypertension: Secondary | ICD-10-CM

## 2022-05-08 NOTE — Telephone Encounter (Signed)
Requested Prescriptions  Pending Prescriptions Disp Refills   amLODipine (NORVASC) 5 MG tablet [Pharmacy Med Name: AMLODIPINE BESYLATE 5 MG TAB] 30 tablet 2    Sig: TAKE 1 TABLET (5 MG TOTAL) BY MOUTH DAILY.     Cardiovascular: Calcium Channel Blockers 2 Passed - 05/08/2022  2:08 AM      Passed - Last BP in normal range    BP Readings from Last 1 Encounters:  03/06/22 132/84         Passed - Last Heart Rate in normal range    Pulse Readings from Last 1 Encounters:  03/06/22 97         Passed - Valid encounter within last 6 months    Recent Outpatient Visits           2 months ago Essential hypertension   [place], FNP   6 months ago Essential hypertension   [place], FNP   7 months ago Migraine with aura and without status migrainosus, not intractable   [facility] Bo Merino, FNP       Future Appointments             In 2 months Reece Packer, Myna Hidalgo, [facility], [facility]

## 2022-05-11 NOTE — Telephone Encounter (Signed)
Called patient to notify that medication has been sent to pharmacy on file, no answer, could not leave voice message.

## 2022-06-27 ENCOUNTER — Other Ambulatory Visit: Payer: Self-pay | Admitting: Nurse Practitioner

## 2022-06-27 DIAGNOSIS — F330 Major depressive disorder, recurrent, mild: Secondary | ICD-10-CM

## 2022-06-27 DIAGNOSIS — F419 Anxiety disorder, unspecified: Secondary | ICD-10-CM

## 2022-06-30 NOTE — Telephone Encounter (Signed)
Requested Prescriptions  Pending Prescriptions Disp Refills   venlafaxine XR (EFFEXOR-XR) 75 MG 24 hr capsule [Pharmacy Med Name: VENLAFAXINE HCL ER 75 MG CAP] 90 capsule 0    Sig: TAKE 1 CAPSULE BY MOUTH DAILY WITH BREAKFAST.     Psychiatry: Antidepressants - SNRI - desvenlafaxine & venlafaxine Failed - 06/27/2022  4:49 AM      Failed - Lipid Panel in normal range within the last 12 months    Cholesterol  Date Value Ref Range Status  03/06/2022 162 <200 mg/dL Final   LDL Cholesterol (Calc)  Date Value Ref Range Status  03/06/2022 94 mg/dL (calc) Final    Comment:    Reference range: <100 . Desirable range <100 mg/dL for primary prevention;   <70 mg/dL for patients with CHD or diabetic patients  with > or = 2 CHD risk factors. Marland Kitchen LDL-C is now calculated using the Martin-Hopkins  calculation, which is a validated novel method providing  better accuracy than the Friedewald equation in the  estimation of LDL-C.  Horald Pollen et al. Lenox Ahr. 7793;903(00): 2061-2068  (http://education.[employer].com/faq/FAQ164)    HDL  Date Value Ref Range Status  03/06/2022 44 (L) > OR = 50 mg/dL Final   Triglycerides  Date Value Ref Range Status  03/06/2022 142 <150 mg/dL Final         Passed - Cr in normal range and within 360 days    Creat  Date Value Ref Range Status  03/06/2022 0.85 0.50 - 0.99 mg/dL Final         Passed - Completed PHQ-2 or PHQ-9 in the last 360 days      Passed - Last BP in normal range    BP Readings from Last 1 Encounters:  03/06/22 132/84         Passed - Valid encounter within last 6 months    Recent Outpatient Visits           3 months ago Essential hypertension   [facility] Berniece Salines, FNP   7 months ago Essential hypertension   [facility] Della Goo F, FNP   9 months ago Migraine with aura and without status migrainosus, not intractable   [facility] Berniece Salines, FNP        Future Appointments             In 1 week Zane Herald, Rudolpho Sevin, FNP [facility], [facility]

## 2022-07-10 ENCOUNTER — Other Ambulatory Visit: Payer: Self-pay | Admitting: Nurse Practitioner

## 2022-07-10 ENCOUNTER — Ambulatory Visit (INDEPENDENT_AMBULATORY_CARE_PROVIDER_SITE_OTHER): Payer: BLUE CROSS/BLUE SHIELD | Admitting: Nurse Practitioner

## 2022-07-10 ENCOUNTER — Telehealth: Payer: Self-pay

## 2022-07-10 ENCOUNTER — Encounter: Payer: Self-pay | Admitting: Nurse Practitioner

## 2022-07-10 VITALS — BP 128/74 | HR 96 | Temp 98.3°F | Resp 18 | Wt 328.9 lb

## 2022-07-10 DIAGNOSIS — G43109 Migraine with aura, not intractable, without status migrainosus: Secondary | ICD-10-CM | POA: Diagnosis not present

## 2022-07-10 DIAGNOSIS — E559 Vitamin D deficiency, unspecified: Secondary | ICD-10-CM

## 2022-07-10 DIAGNOSIS — F330 Major depressive disorder, recurrent, mild: Secondary | ICD-10-CM | POA: Diagnosis not present

## 2022-07-10 DIAGNOSIS — F419 Anxiety disorder, unspecified: Secondary | ICD-10-CM

## 2022-07-10 DIAGNOSIS — R7303 Prediabetes: Secondary | ICD-10-CM

## 2022-07-10 DIAGNOSIS — I10 Essential (primary) hypertension: Secondary | ICD-10-CM

## 2022-07-10 DIAGNOSIS — E785 Hyperlipidemia, unspecified: Secondary | ICD-10-CM | POA: Insufficient documentation

## 2022-07-10 MED ORDER — ZEPBOUND 2.5 MG/0.5ML ~~LOC~~ SOAJ: 2.5000 mg | SUBCUTANEOUS | 0 refills | Status: DC

## 2022-07-10 NOTE — Assessment & Plan Note (Signed)
Continue working on lifestyle modification.  Check on zepbound being on your formulary.

## 2022-07-10 NOTE — Assessment & Plan Note (Signed)
takes effexor 75 mg daily.  She takes the buspar as needed.

## 2022-07-10 NOTE — Assessment & Plan Note (Signed)
takes effexor 75 mg daily.  She takes the buspar as needed. 

## 2022-07-10 NOTE — Telephone Encounter (Signed)
Pt states she talked with insurance and zebound is covered, please send in

## 2022-07-10 NOTE — Assessment & Plan Note (Signed)
Continue working on lifestyle modification 

## 2022-07-10 NOTE — Progress Notes (Signed)
BP 128/74   Pulse 96   Temp 98.3 F (36.8 C)   Resp 18   Wt (!) 328 lb 14.4 oz (149.2 kg)   SpO2 97%   BMI 53.09 kg/m    Subjective:    Patient ID: Shannon Clayton, female    DOB: Nov 29, 1975, 47 y.o.   MRN: [id number]  HPI: Shannon Clayton is a 47 y.o. female, here alone  Chief Complaint  Patient presents with   Follow-up   Hypertension    Migraines: patient currently takes Fioricet 50-325-40 mg as needed for migraines. Patient reports she does get auras with her migraines. Patient reports she had not had a migraine in a while. No changes to treatment plan.   HTN: she currently takes amlodipine 5 mg daily.  She denies any chest pain , headaches, blurred vision or shortness of breath. Her blood pressure today is 128/74.   Morbid obesity: Patient is currently doing weight watchers.  At her last appointment she weighed 329 pounds with a BMI 53.25, today she weighs 328 lbs with BMI 53.09. we discussed weightloss medication today.  Went over options and Clayton effects she would like to try and get approved for zepbound. She is going to check with her insurance to see if it is covered and she is going to see about printing out the coupon. She will let us know if she can get the medication and we will send it in.    Vitamin D deficiency: Patient is currently taking vitamin D supplementation.  Her last vitamin D on 10/03/2021 was 33. No changes  Hyperlipidemia:she is doing weight watchers and working on lifestyle modification. Her last LDL was 94 on 03/06/2022.   The 10-year ASCVD risk score (Arnett DK, et al., 2019) is: 1%   Values used to calculate the score:     Age: 47 years     Sex: Female     Is Non-Hispanic African American: No     Diabetic: No     Tobacco smoker: No     Systolic Blood Pressure: 0000000 mmHg     Is BP treated: Yes     HDL Cholesterol: 44 mg/dL     Total Cholesterol: 162 mg/dL   prediabetes: she is working on weight loss and has been doing Veterinary surgeon.   Her last A1C was 5.6 on 03/06/2022.  Depression and anxiety: she takes effexor 75 mg daily.  She takes the buspar as needed. Her PHQ9 and GAD scores are has improved. She reports she is doing well.     07/10/2022    3:24 PM 03/06/2022    3:12 PM 11/03/2021    2:59 PM 10/03/2021    2:30 PM 05/26/2019    3:55 PM  Depression screen PHQ 2/9  Decreased Interest 0 0 0 0 2  Down, Depressed, Hopeless 1 0 0 0 2  PHQ - 2 Score 1 0 0 0 4  Altered sleeping 0 2  2 3   Tired, decreased energy 0 0  1 2  Change in appetite 0 0  0 2  Feeling bad or failure about yourself  0 1  0 3  Trouble concentrating 0 0  0 0  Moving slowly or fidgety/restless 0 0  0 1  Suicidal thoughts 0 0  0 0  PHQ-9 Score 1 3  3 15   Difficult doing work/chores Not difficult at all Not difficult at all   Somewhat difficult       03/06/2022  3:12 PM 10/03/2021    3:02 PM 05/26/2019    3:56 PM  GAD 7 : Generalized Anxiety Score  Nervous, Anxious, on Edge 2 3 3   Control/stop worrying 2 3 3   Worry too much - different things 2 2 3   Trouble relaxing 1 0 3  Restless 1 0 2  Easily annoyed or irritable 0 1 3  Afraid - awful might happen 0 1 3  Total GAD 7 Score 8 10 20   Anxiety Difficulty Not difficult at all Not difficult at all Somewhat difficult    Relevant past medical, surgical, family and social history reviewed and updated as indicated. Interim medical history since our last visit reviewed. Allergies and medications reviewed and updated.  Review of Systems  Constitutional: Negative for fever or weight change.  Respiratory: Negative for cough and shortness of breath.   Cardiovascular: Negative for chest pain or palpitations.  Gastrointestinal: Negative for abdominal pain, no bowel changes.  Musculoskeletal: Negative for gait problem or joint swelling.  Skin: Negative for rash.  Neurological: Negative for dizziness or headache.  No other specific complaints in a complete review of systems (except as listed in HPI  above).      Objective:    BP 128/74   Pulse 96   Temp 98.3 F (36.8 C)   Resp 18   Wt (!) 328 lb 14.4 oz (149.2 kg)   SpO2 97%   BMI 53.09 kg/m   Wt Readings from Last 3 Encounters:  07/10/22 (!) 328 lb 14.4 oz (149.2 kg)  03/06/22 (!) 329 lb 14.4 oz (149.6 kg)  11/03/21 (!) 340 lb 6.4 oz (154.4 kg)    Physical Exam  Constitutional: Patient appears well-developed and well-nourished. Obese  No distress.  HEENT: head atraumatic, normocephalic, pupils equal and reactive to light,  neck supple Cardiovascular: Normal rate, regular rhythm and normal heart sounds.  No murmur heard. No BLE edema. Pulmonary/Chest: Effort normal and breath sounds normal. No respiratory distress. Abdominal: Soft.  There is no tenderness. Psychiatric: Patient has a normal mood and affect. behavior is normal. Judgment and thought content normal.   Results for orders placed or performed in visit on 03/06/22  CBC with Differential/Platelet  Result Value Ref Range   WBC 6.8 3.8 - 10.8 Thousand/uL   RBC 4.44 3.80 - 5.10 Million/uL   Hemoglobin 12.9 11.7 - 15.5 g/dL   HCT 38.3 35.0 - 45.0 %   MCV 86.3 80.0 - 100.0 fL   MCH 29.1 27.0 - 33.0 pg   MCHC 33.7 32.0 - 36.0 g/dL   RDW 13.6 11.0 - 15.0 %   Platelets 346 140 - 400 Thousand/uL   MPV 9.9 7.5 - 12.5 fL   Neutro Abs 3,808 1,500 - 7,800 cells/uL   Lymphs Abs 2,353 850 - 3,900 cells/uL   Absolute Monocytes 435 200 - 950 cells/uL   Eosinophils Absolute 156 15 - 500 cells/uL   Basophils Absolute 48 0 - 200 cells/uL   Neutrophils Relative % 56 %   Total Lymphocyte 34.6 %   Monocytes Relative 6.4 %   Eosinophils Relative 2.3 %   Basophils Relative 0.7 %  COMPLETE METABOLIC PANEL WITH GFR  Result Value Ref Range   Glucose, Bld 86 65 - 99 mg/dL   BUN 19 7 - 25 mg/dL   Creat 0.85 0.50 - 0.99 mg/dL   eGFR 87 > OR = 60 mL/min/1.57m2   BUN/Creatinine Ratio SEE NOTE: 6 - 22 (calc)   Sodium 138 135 - 146  mmol/L   Potassium 4.3 3.5 - 5.3 mmol/L    Chloride 105 98 - 110 mmol/L   CO2 25 20 - 32 mmol/L   Calcium 9.3 8.6 - 10.2 mg/dL   Total Protein 6.5 6.1 - 8.1 g/dL   Albumin 4.2 3.6 - 5.1 g/dL   Globulin 2.3 1.9 - 3.7 g/dL (calc)   AG Ratio 1.8 1.0 - 2.5 (calc)   Total Bilirubin 0.4 0.2 - 1.2 mg/dL   Alkaline phosphatase (APISO) 69 31 - 125 U/L   AST 31 (H) 10 - 30 U/L   ALT 48 (H) 6 - 29 U/L  Lipid panel  Result Value Ref Range   Cholesterol 162 <200 mg/dL   HDL 44 (L) > OR = 50 mg/dL   Triglycerides 142 <150 mg/dL   LDL Cholesterol (Calc) 94 mg/dL (calc)   Total CHOL/HDL Ratio 3.7 <5.0 (calc)   Non-HDL Cholesterol (Calc) 118 <130 mg/dL (calc)  Hemoglobin A1c  Result Value Ref Range   Hgb A1c MFr Bld 5.6 <5.7 % of total Hgb   Mean Plasma Glucose 114 mg/dL   eAG (mmol/L) 6.3 mmol/L      Assessment & Plan:   Problem List Items Addressed This Visit       Cardiovascular and Mediastinum   Migraine with aura and without status migrainosus, not intractable    Doing well has not had a migraine in a long time.       Essential hypertension - Primary    Continue taking amlodipine 5 mg daily.          Other   Morbid obesity ([place])    Continue working on lifestyle modification.  Check on zepbound being on your formulary.       Prediabetes    Continue working on lifestyle modification      Vitamin D deficiency    Continue vitamin d supplement      Anxiety    takes effexor 75 mg daily.  She takes the buspar as needed.      Mild episode of recurrent major depressive disorder (HCC)    takes effexor 75 mg daily.  She takes the buspar as needed.      Hyperlipidemia    Continue working on lifestyle modification        Follow up plan: Return in about 4 months (around 11/08/2022) for follow up.

## 2022-07-10 NOTE — Assessment & Plan Note (Signed)
Continue taking amlodipine 5 mg daily 

## 2022-07-10 NOTE — Assessment & Plan Note (Signed)
Doing well has not had a migraine in a long time.

## 2022-07-10 NOTE — Assessment & Plan Note (Signed)
Continue vitamin d supplement 

## 2022-07-13 NOTE — Telephone Encounter (Signed)
Requested medication (s) are due for refill today:   Provider to review  Requested medication (s) are on the active medication list:   Shannon Clayton is not on the list   Changed to Zepbound 07/10/2022  Future visit scheduled:   Yes   Last ordered: Wegovy needs a PA.    Looks like it has been changed to [place].   Not sure if pharmacy is requesting a PA for the [facility] or the Zepbound.      Returned for provider to review   Requested Prescriptions  Pending Prescriptions Disp Refills   WEGOVY 0.25 MG/0.5ML SOAJ [Pharmacy Med Name: WEGOVY 0.25 MG/0.5 ML PEN]  0     Endocrinology:  Diabetes - GLP-1 Receptor Agonists - semaglutide Passed - 07/10/2022  4:35 PM      Passed - HBA1C in normal range and within 180 days    Hgb A1c MFr Bld  Date Value Ref Range Status  03/06/2022 5.6 <5.7 % of total Hgb Final    Comment:    For the purpose of screening for the presence of diabetes: . <5.7%       Consistent with the absence of diabetes 5.7-6.4%    Consistent with increased risk for diabetes             (prediabetes) > or =6.5%  Consistent with diabetes . This assay result is consistent with a decreased risk of diabetes. . Currently, no consensus exists regarding use of hemoglobin A1c for diagnosis of diabetes in children. . According to American Diabetes Association (ADA) guidelines, hemoglobin A1c <7.0% represents optimal control in non-pregnant diabetic patients. Different metrics may apply to specific patient populations.  Standards of Medical Care in Diabetes(ADA). .          Passed - Cr in normal range and within 360 days    Creat  Date Value Ref Range Status  03/06/2022 0.85 0.50 - 0.99 mg/dL Final         Passed - Valid encounter within last 6 months    Recent Outpatient Visits           3 days ago Essential hypertension   [place], FNP   4 months ago Essential hypertension   [place], FNP   8  months ago Essential hypertension   [place], FNP   9 months ago Migraine with aura and without status migrainosus, not intractable   [facility] Bo Merino, FNP       Future Appointments             In 3 months Reece Packer, Myna Hidalgo, [facility], [facility]

## 2022-07-14 ENCOUNTER — Encounter: Payer: Self-pay | Admitting: Nurse Practitioner

## 2022-07-17 NOTE — Telephone Encounter (Signed)
Pa IN PROCESS

## 2022-08-04 ENCOUNTER — Other Ambulatory Visit: Payer: Self-pay | Admitting: Nurse Practitioner

## 2022-08-04 DIAGNOSIS — I10 Essential (primary) hypertension: Secondary | ICD-10-CM

## 2022-08-04 NOTE — Telephone Encounter (Signed)
Requested Prescriptions  Pending Prescriptions Disp Refills   amLODipine (NORVASC) 5 MG tablet [Pharmacy Med Name: AMLODIPINE BESYLATE 5 MG TAB] 30 tablet 2    Sig: TAKE 1 TABLET (5 MG TOTAL) BY MOUTH DAILY.     Cardiovascular: Calcium Channel Blockers 2 Passed - 08/04/2022  1:39 AM      Passed - Last BP in normal range    BP Readings from Last 1 Encounters:  07/10/22 128/74         Passed - Last Heart Rate in normal range    Pulse Readings from Last 1 Encounters:  07/10/22 96         Passed - Valid encounter within last 6 months    Recent Outpatient Visits           3 weeks ago Essential hypertension   [place], FNP   5 months ago Essential hypertension   [place], FNP   9 months ago Essential hypertension   [facility] Serafina Royals F, FNP   10 months ago Migraine with aura and without status migrainosus, not intractable   [facility] Bo Merino, FNP       Future Appointments             In 3 months Reece Packer, Myna Hidalgo, [facility], [facility]

## 2022-08-14 ENCOUNTER — Other Ambulatory Visit: Payer: Self-pay | Admitting: Nurse Practitioner

## 2022-08-14 MED ORDER — SEMAGLUTIDE-WEIGHT MANAGEMENT 0.25 MG/0.5ML ~~LOC~~ SOAJ: 0.2500 mg | SUBCUTANEOUS | 0 refills | Status: AC

## 2022-08-29 ENCOUNTER — Other Ambulatory Visit: Payer: Self-pay | Admitting: Nurse Practitioner

## 2022-08-29 DIAGNOSIS — F419 Anxiety disorder, unspecified: Secondary | ICD-10-CM

## 2022-08-29 DIAGNOSIS — F330 Major depressive disorder, recurrent, mild: Secondary | ICD-10-CM

## 2022-08-31 NOTE — Telephone Encounter (Signed)
Requested Prescriptions  Pending Prescriptions Disp Refills   venlafaxine XR (EFFEXOR-XR) 75 MG 24 hr capsule [Pharmacy Med Name: VENLAFAXINE HCL ER 75 MG CAP] 90 capsule 1    Sig: TAKE 1 CAPSULE BY MOUTH DAILY WITH BREAKFAST.     Psychiatry: Antidepressants - SNRI - desvenlafaxine & venlafaxine Failed - 08/29/2022 11:32 AM      Failed - Lipid Panel in normal range within the last 12 months    Cholesterol  Date Value Ref Range Status  03/06/2022 162 <200 mg/dL Final   LDL Cholesterol (Calc)  Date Value Ref Range Status  03/06/2022 94 mg/dL (calc) Final    Comment:    Reference range: <100 . Desirable range <100 mg/dL for primary prevention;   <70 mg/dL for patients with CHD or diabetic patients  with > or = 2 CHD risk factors. Marland Kitchen LDL-C is now calculated using the Martin-Hopkins  calculation, which is a validated novel method providing  better accuracy than the Friedewald equation in the  estimation of LDL-C.  Cresenciano Genre et al. Annamaria Helling. WG:[id number]): 2061-2068  (http://education.[facility].com/faq/FAQ164)    HDL  Date Value Ref Range Status  03/06/2022 44 (L) > OR = 50 mg/dL Final   Triglycerides  Date Value Ref Range Status  03/06/2022 142 <150 mg/dL Final         Passed - Cr in normal range and within 360 days    Creat  Date Value Ref Range Status  03/06/2022 0.85 0.50 - 0.99 mg/dL Final         Passed - Completed PHQ-2 or PHQ-9 in the last 360 days      Passed - Last BP in normal range    BP Readings from Last 1 Encounters:  07/10/22 128/74         Passed - Valid encounter within last 6 months    Recent Outpatient Visits           1 month ago Essential hypertension   [place], FNP   5 months ago Essential hypertension   [facility] Bo Merino, FNP   10 months ago Essential hypertension   [facility] Serafina Royals F, FNP   11 months ago Migraine with  aura and without status migrainosus, not intractable   [facility] Bo Merino, FNP       Future Appointments             In 2 months Reece Packer, Myna Hidalgo, [facility], [facility]

## 2022-09-02 ENCOUNTER — Telehealth: Payer: Self-pay | Admitting: Nurse Practitioner

## 2022-09-02 NOTE — Telephone Encounter (Signed)
Copied from [place] [phone]. Topic: General - Other >> Sep 02, 2022 11:29 AM Sabas Sous wrote: Reason for CRM: Pt called for a status update on wegovy Rx request  Best contact: [phone]

## 2022-09-02 NOTE — Telephone Encounter (Signed)
All information faxed back to [facility]

## 2022-09-13 ENCOUNTER — Encounter: Payer: Self-pay | Admitting: Nurse Practitioner

## 2022-09-17 NOTE — Progress Notes (Signed)
Name: Shannon Clayton   MRN: [id number]    DOB: December 19, 1975   Date:09/18/2022       Progress Note  Subjective  Chief Complaint  Chief Complaint  Patient presents with   Obesity    Follow up Wegovy    I connected with  Sonia Side  on 09/18/22 at 8:00am by a video enabled telemedicine application and verified that I am speaking with the correct person using two identifiers.  I discussed the limitations of evaluation and management by telemedicine and the availability of in person appointments. The patient expressed understanding and agreed to proceed with a virtual visit  Staff also discussed with the patient that there may be a patient responsible charge related to this service. Patient Location: home Provider Location: [facility] Additional Individuals present: alone  HPI  Obesity: her last appointment her weight was 328 lbs on 07/10/2022. She is currently taking wegovy 0.25 mg weekly and working on lifestyle modification.  She reports she is down to 330 lbs.  She reports she is doing well. She denies any side effects.  Will send in next three doses. Follow up in three months.   Patient Active Problem List   Diagnosis Date Noted   Hyperlipidemia 07/10/2022   Mild episode of recurrent major depressive disorder ([place]) 11/03/2021   Hip pain 10/03/2021   ASCUS with positive high risk HPV cervical 07/27/2019   Elevated liver enzymes 06/23/2019   Annual physical exam 06/23/2019   Prediabetes 06/09/2019   Vitamin D deficiency 06/09/2019   Migraine with aura and without status migrainosus, not intractable 05/28/2019   Essential hypertension 05/28/2019   Anxiety and depression 05/28/2019   Morbid obesity ([place]) 05/10/2014   Irregular periods 05/10/2014   Anxiety 11/21/2012    Social History   Tobacco Use   Smoking status: Never   Smokeless tobacco: Never  Substance Use Topics   Alcohol use: Yes     Current Outpatient Medications:    amLODipine (NORVASC) 5 MG tablet, TAKE  1 TABLET (5 MG TOTAL) BY MOUTH DAILY., Disp: 30 tablet, Rfl: 2   busPIRone (BUSPAR) 5 MG tablet, TAKE 1 TABLET BY MOUTH 3 TIMES DAILY AS NEEDED., Disp: 270 tablet, Rfl: 1   butalbital-acetaminophen-caffeine (FIORICET) 50-325-40 MG tablet, Take 1-2 tablets by mouth daily as needed for headache. Limit use no more 2x per week, Disp: 30 tablet, Rfl: 2   co-enzyme Q-10 30 MG capsule, Take 30 mg by mouth 3 (three) times daily., Disp: , Rfl:    MAGNESIUM PO, Take by mouth., Disp: , Rfl:    Omega-3 Fatty Acids (FISH OIL OMEGA-3 PO), Take by mouth., Disp: , Rfl:    Semaglutide-Weight Management 0.5 MG/0.5ML SOAJ, Inject 0.5 mg into the skin once a week for 28 days., Disp: 2 mL, Rfl: 0   [START ON 10/18/2022] Semaglutide-Weight Management 1 MG/0.5ML SOAJ, Inject 1 mg into the skin once a week for 28 days., Disp: 2 mL, Rfl: 0   [START ON 11/16/2022] Semaglutide-Weight Management 1.7 MG/0.75ML SOAJ, Inject 1.7 mg into the skin once a week for 28 days., Disp: 3 mL, Rfl: 0   venlafaxine XR (EFFEXOR-XR) 75 MG 24 hr capsule, TAKE 1 CAPSULE BY MOUTH DAILY WITH BREAKFAST., Disp: 90 capsule, Rfl: 1  Allergies  Allergen Reactions   Sumatriptan Other (See Comments)    myalgias Myalgias; makes shoulders and neck hurt    I personally reviewed active problem list, medication list, allergies, notes from last encounter with the patient/caregiver today.  ROS  Constitutional: Negative for fever or weight change.  Respiratory: Negative for cough and shortness of breath.   Cardiovascular: Negative for chest pain or palpitations.  Gastrointestinal: Negative for abdominal pain, no bowel changes.  Musculoskeletal: Negative for gait problem or joint swelling.  Skin: Negative for rash.  Neurological: Negative for dizziness or headache.  No other specific complaints in a complete review of systems (except as listed in HPI above).   Objective  Virtual encounter, vitals not obtained.  Body mass index is 53.26  kg/m.  Nursing Note and Vital Signs reviewed.  Physical Exam  Awake, alert and oriented, speaking in complete sentences  No results found for this or any previous visit (from the past 72 hour(s)).  Assessment & Plan  Problem List Items Addressed This Visit       Other   Morbid obesity ([place]) - Primary    Doing well on wegovy, next three doses sent in. Continue working on lifestyle modification.       Relevant Medications   Semaglutide-Weight Management 0.5 MG/0.5ML SOAJ   Semaglutide-Weight Management 1 MG/0.5ML SOAJ (Start on 10/18/2022)   Semaglutide-Weight Management 1.7 MG/0.75ML SOAJ (Start on 11/16/2022)     -Red flags and when to present for emergency care or RTC including fever >101.42F, chest pain, shortness of breath, new/worsening/un-resolving symptoms,  reviewed with patient at time of visit. Follow up and care instructions discussed and provided in AVS. - I discussed the assessment and treatment plan with the patient. The patient was provided an opportunity to ask questions and all were answered. The patient agreed with the plan and demonstrated an understanding of the instructions.  I provided 15 minutes of non-face-to-face time during this encounter.  Bo Merino, FNP

## 2022-09-18 ENCOUNTER — Other Ambulatory Visit: Payer: Self-pay

## 2022-09-18 ENCOUNTER — Encounter: Payer: Self-pay | Admitting: Nurse Practitioner

## 2022-09-18 ENCOUNTER — Telehealth (INDEPENDENT_AMBULATORY_CARE_PROVIDER_SITE_OTHER): Payer: BLUE CROSS/BLUE SHIELD | Admitting: Nurse Practitioner

## 2022-09-18 DIAGNOSIS — Z6841 Body Mass Index (BMI) 40.0 and over, adult: Secondary | ICD-10-CM | POA: Diagnosis not present

## 2022-09-18 MED ORDER — SEMAGLUTIDE-WEIGHT MANAGEMENT 1.7 MG/0.75ML ~~LOC~~ SOAJ: 1.7000 mg | SUBCUTANEOUS | 0 refills | Status: DC

## 2022-09-18 MED ORDER — SEMAGLUTIDE-WEIGHT MANAGEMENT 0.5 MG/0.5ML ~~LOC~~ SOAJ: 0.5000 mg | SUBCUTANEOUS | 0 refills | Status: AC

## 2022-09-18 MED ORDER — SEMAGLUTIDE-WEIGHT MANAGEMENT 1 MG/0.5ML ~~LOC~~ SOAJ: 1.0000 mg | SUBCUTANEOUS | 0 refills | Status: AC

## 2022-09-18 NOTE — Assessment & Plan Note (Addendum)
Doing well on wegovy, next three doses sent in. Continue working on lifestyle modification.

## 2022-09-29 ENCOUNTER — Other Ambulatory Visit: Payer: Self-pay | Admitting: Nurse Practitioner

## 2022-09-29 DIAGNOSIS — F419 Anxiety disorder, unspecified: Secondary | ICD-10-CM

## 2022-09-29 NOTE — Telephone Encounter (Signed)
Requested Prescriptions  Pending Prescriptions Disp Refills   busPIRone (BUSPAR) 5 MG tablet [Pharmacy Med Name: BUSPIRONE HCL 5 MG TABLET] 270 tablet 0    Sig: TAKE 1 TABLET BY MOUTH THREE TIMES A DAY AS NEEDED     Psychiatry: Anxiolytics/Hypnotics - Non-controlled Passed - 09/29/2022  2:32 AM      Passed - Valid encounter within last 12 months    Recent Outpatient Visits           1 week ago Morbid obesity [facility])   [facility] Bo Merino, FNP   2 months ago Essential hypertension   [facility] Bo Merino, FNP   6 months ago Essential hypertension   [facility] Bo Merino, FNP   11 months ago Essential hypertension   [facility] Serafina Royals F, FNP   12 months ago Migraine with aura and without status migrainosus, not intractable   [facility] Bo Merino, FNP       Future Appointments             In 1 month Reece Packer, Myna Hidalgo, [facility], [facility]

## 2022-10-26 ENCOUNTER — Other Ambulatory Visit: Payer: Self-pay | Admitting: Nurse Practitioner

## 2022-10-26 DIAGNOSIS — I10 Essential (primary) hypertension: Secondary | ICD-10-CM

## 2022-10-27 ENCOUNTER — Telehealth: Payer: BLUE CROSS/BLUE SHIELD | Admitting: Physician Assistant

## 2022-10-27 DIAGNOSIS — J069 Acute upper respiratory infection, unspecified: Secondary | ICD-10-CM | POA: Diagnosis not present

## 2022-10-27 MED ORDER — IPRATROPIUM BROMIDE 0.03 % NA SOLN: 2.0000 | Freq: Two times a day (BID) | NASAL | 0 refills | Status: DC

## 2022-10-27 MED ORDER — BENZONATATE 100 MG PO CAPS: 100.0000 mg | ORAL_CAPSULE | Freq: Three times a day (TID) | ORAL | 0 refills | Status: DC | PRN

## 2022-10-27 MED ORDER — ALBUTEROL SULFATE HFA 108 (90 BASE) MCG/ACT IN AERS: 2.0000 | INHALATION_SPRAY | Freq: Four times a day (QID) | RESPIRATORY_TRACT | 0 refills | Status: DC | PRN

## 2022-10-27 NOTE — Progress Notes (Signed)
E-Visit for Upper Respiratory Infection  ° °We are sorry you are not feeling well.  Here is how we plan to help! ° °Based on what you have shared with me, it looks like you may have a viral upper respiratory infection.  Upper respiratory infections are caused by a large number of viruses; however, rhinovirus is the most common cause.  ° °Symptoms vary from person to person, with common symptoms including sore throat, cough, fatigue or lack of energy and feeling of general discomfort.  A low-grade fever of up to 100.4 may present, but is often uncommon.  Symptoms vary however, and are closely related to a person's age or underlying illnesses.  The most common symptoms associated with an upper respiratory infection are nasal discharge or congestion, cough, sneezing, headache and pressure in the ears and face.  These symptoms usually persist for about 3 to 10 days, but can last up to 2 weeks.  It is important to know that upper respiratory infections do not cause serious illness or complications in most cases.   ° °Upper respiratory infections can be transmitted from person to person, with the most common method of transmission being a person's hands.  The virus is able to live on the skin and can infect other persons for up to 2 hours after direct contact.  Also, these can be transmitted when someone coughs or sneezes; thus, it is important to cover the mouth to reduce this risk.  To keep the spread of the illness at bay, good hand hygiene is very important. ° °This is an infection that is most likely caused by a virus. There are no specific treatments other than to help you with the symptoms until the infection runs its course.  We are sorry you are not feeling well.  Here is how we plan to help! ° ° °For nasal congestion, you may use an oral decongestants such as Mucinex D or if you have glaucoma or high blood pressure use plain Mucinex.  Saline nasal spray or nasal drops can help and can safely be used as often as  needed for congestion.  For your congestion, I have prescribed Ipratropium Bromide nasal spray 0.03% two sprays in each nostril 2-3 times a day ° °If you do not have a history of heart disease, hypertension, diabetes or thyroid disease, prostate/bladder issues or glaucoma, you may also use Sudafed to treat nasal congestion.  It is highly recommended that you consult with a pharmacist or your primary care physician to ensure this medication is safe for you to take.    ° °If you have a cough, you may use cough suppressants such as Delsym and Robitussin.  If you have glaucoma or high blood pressure, you can also use Coricidin HBP.   °For cough I have prescribed for you A prescription cough medication called Tessalon Perles 100 mg. You may take 1-2 capsules every 8 hours as needed for cough ° °If you have a sore or scratchy throat, use a saltwater gargle- ¼ to ½ teaspoon of salt dissolved in a 4-ounce to 8-ounce glass of warm water.  Gargle the solution for approximately 15-30 seconds and then spit.  It is important not to swallow the solution.  You can also use throat lozenges/cough drops and Chloraseptic spray to help with throat pain or discomfort.  Warm or cold liquids can also be helpful in relieving throat pain. ° °For headache, pain or general discomfort, you can use Ibuprofen or Tylenol as directed.   °  Some authorities believe that zinc sprays or the use of Echinacea may shorten the course of your symptoms. ° ° °HOME CARE °Only take medications as instructed by your medical team. °Be sure to drink plenty of fluids. Water is fine as well as fruit juices, sodas and electrolyte beverages. You may want to stay away from caffeine or alcohol. If you are nauseated, try taking small sips of liquids. How do you know if you are getting enough fluid? Your urine should be a pale yellow or almost colorless. °Get rest. °Taking a steamy shower or using a humidifier may help nasal congestion and ease sore throat pain. You can  place a towel over your head and breathe in the steam from hot water coming from a faucet. °Using a saline nasal spray works much the same way. °Cough drops, hard candies and sore throat lozenges may ease your cough. °Avoid close contacts especially the very young and the elderly °Cover your mouth if you cough or sneeze °Always remember to wash your hands.  ° °GET HELP RIGHT AWAY IF: °You develop worsening fever. °If your symptoms do not improve within 10 days °You develop yellow or green discharge from your nose over 3 days. °You have coughing fits °You develop a severe head ache or visual changes. °You develop shortness of breath, difficulty breathing or start having chest pain °Your symptoms persist after you have completed your treatment plan ° °MAKE SURE YOU  °Understand these instructions. °Will watch your condition. °Will get help right away if you are not doing well or get worse. ° °Thank you for choosing an e-visit. ° °Your e-visit answers were reviewed by a board certified advanced clinical practitioner to complete your personal care plan. Depending upon the condition, your plan could have included both over the counter or prescription medications. ° °Please review your pharmacy choice. Make sure the pharmacy is open so you can pick up prescription now. If there is a problem, you may contact your provider through MyChart messaging and have the prescription routed to another pharmacy.  Your safety is important to us. If you have drug allergies check your prescription carefully.  ° °For the next 24 hours you can use MyChart to ask questions about today's visit, request a non-urgent call back, or ask for a work or school excuse. °You will get an email in the next two days asking about your experience. I hope that your e-visit has been valuable and will speed your recovery. ° °

## 2022-10-27 NOTE — Telephone Encounter (Signed)
Requested Prescriptions  Pending Prescriptions Disp Refills   amLODipine (NORVASC) 5 MG tablet [Pharmacy Med Name: AMLODIPINE BESYLATE 5 MG TAB] 90 tablet 1    Sig: TAKE 1 TABLET (5 MG TOTAL) BY MOUTH DAILY.     Cardiovascular: Calcium Channel Blockers 2 Passed - 10/26/2022  1:43 AM      Passed - Last BP in normal range    BP Readings from Last 1 Encounters:  07/10/22 128/74         Passed - Last Heart Rate in normal range    Pulse Readings from Last 1 Encounters:  07/10/22 96         Passed - Valid encounter within last 6 months    Recent Outpatient Visits           1 month ago Morbid obesity [facility])   [facility] Berniece Salines, FNP   3 months ago Essential hypertension   [facility] Berniece Salines, FNP   7 months ago Essential hypertension   [facility] Berniece Salines, FNP   11 months ago Essential hypertension   [facility] Della Goo F, FNP   1 year ago Migraine with aura and without status migrainosus, not intractable   [facility] Berniece Salines, FNP       Future Appointments             In 1 week Zane Herald, Rudolpho Sevin, FNP [facility], [facility]

## 2022-10-27 NOTE — Progress Notes (Signed)
I have spent 5 minutes in review of e-visit questionnaire, review and updating patient chart, medical decision making and response to patient.   Kanyla Omeara Cody Linzee Depaul, PA-C    

## 2022-10-28 ENCOUNTER — Ambulatory Visit: Payer: Self-pay

## 2022-10-28 ENCOUNTER — Ambulatory Visit (INDEPENDENT_AMBULATORY_CARE_PROVIDER_SITE_OTHER): Payer: BLUE CROSS/BLUE SHIELD

## 2022-10-28 ENCOUNTER — Ambulatory Visit
Admission: EM | Admit: 2022-10-28 | Discharge: 2022-10-28 | Disposition: A | Discharge status: Home / Self Care | Payer: BLUE CROSS/BLUE SHIELD | Attending: Physician Assistant | Admitting: Physician Assistant

## 2022-10-28 ENCOUNTER — Encounter: Payer: Self-pay | Admitting: Emergency Medicine

## 2022-10-28 DIAGNOSIS — M79651 Pain in right thigh: Secondary | ICD-10-CM | POA: Insufficient documentation

## 2022-10-28 DIAGNOSIS — R059 Cough, unspecified: Secondary | ICD-10-CM | POA: Diagnosis not present

## 2022-10-28 DIAGNOSIS — R0989 Other specified symptoms and signs involving the circulatory and respiratory systems: Secondary | ICD-10-CM | POA: Diagnosis present

## 2022-10-28 DIAGNOSIS — J189 Pneumonia, unspecified organism: Secondary | ICD-10-CM | POA: Insufficient documentation

## 2022-10-28 LAB — D-DIMER, QUANTITATIVE: D-Dimer, Quant: <0.27 ug/mL-FEU (ref 0.00–0.50)

## 2022-10-28 MED ORDER — PROMETHAZINE-DM 6.25-15 MG/5ML PO SYRP: 5.0000 mL | ORAL_SOLUTION | Freq: Four times a day (QID) | ORAL | 0 refills | Status: DC | PRN

## 2022-10-28 MED ORDER — AZITHROMYCIN 250 MG PO TABS: ORAL_TABLET | ORAL | 0 refills | Status: DC

## 2022-10-28 NOTE — ED Provider Notes (Signed)
[facility]    CSN: [id number] Arrival date & time: 10/28/22  0840      History   Chief Complaint Chief Complaint  Patient presents with   Cough   Leg Pain    HPI Shannon Clayton is a 47 y.o. female.   Patient is a 47 year old female who presents with chief complaint of cough with chest congestion and some wheezing ongoing x 4 days.  Patient also reporting runny nose.  She states her cough is productive but she is swallowing and is not sure what it looks like.  Patient in ED visit last night was given prescription for albuterol inhaler, Tessalon Perles, and Atrovent nasal spray.  She states she did 2 puffs inhaler last night but did not seem to do anything for her.  Patient also reports left thigh pain that started this morning.  Patient denies any trauma or injury to the leg but did report concern about a clot.  Patient states she is not on birth control and does not smoke.  No recent long trips.  Patient states she started not feeling well on Friday and the cough started worsening on Saturday.  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    Past Medical History:  Diagnosis Date   Depression    Frequent headaches    Hypertension    Migraine headache with aura     Patient Active Problem List   Diagnosis Date Noted   Hyperlipidemia 07/10/2022   Mild episode of recurrent major depressive disorder 11/03/2021   Hip pain 10/03/2021   ASCUS with positive high risk HPV cervical 07/27/2019   Elevated liver enzymes 06/23/2019   Annual physical exam 06/23/2019   Prediabetes 06/09/2019   Vitamin D deficiency 06/09/2019   Migraine with aura and without status migrainosus, not intractable 05/28/2019   Essential hypertension 05/28/2019   Anxiety and depression 05/28/2019   Morbid obesity 05/10/2014   Irregular periods 05/10/2014   Anxiety 11/21/2012    Past Surgical History:  Procedure Laterality Date   CESAREAN SECTION  2006   ear  pinning  1984   TONSILLECTOMY AND ADENOIDECTOMY  2011    OB History     Gravida  1   Para  1   Term  1   Preterm      AB      Living  1      SAB      IAB      Ectopic      Multiple      Live Births  1            Home Medications    Prior to Admission medications   Medication Sig Start Date End Date Taking? Authorizing Provider  azithromycin (ZITHROMAX Z-PAK) 250 MG tablet Take 2 tablets by mouth the first day followed by one tablet daily for next 4 days. 10/28/22  Yes Candis Schatz, PA-C  promethazine-dextromethorphan (PROMETHAZINE-DM) 6.25-15 MG/5ML syrup Take 5 mLs by mouth 4 (four) times daily as needed for cough. 10/28/22  Yes Candis Schatz, PA-C  albuterol (VENTOLIN HFA) 108 (90 Base) MCG/ACT inhaler Inhale 2 puffs into the lungs every 6 (six) hours as needed for wheezing or shortness of breath. 10/27/22   Waldon Merl, PA-C  amLODipine (NORVASC) 5 MG tablet TAKE 1 TABLET (5 MG TOTAL) BY MOUTH DAILY. 10/27/22   Berniece Salines, FNP  benzonatate (TESSALON) 100 MG capsule Take 1 capsule (100 mg total)  by mouth 3 (three) times daily as needed for cough. 10/27/22   Waldon Merl, PA-C  busPIRone (BUSPAR) 5 MG tablet TAKE 1 TABLET BY MOUTH THREE TIMES A DAY AS NEEDED 09/29/22   Berniece Salines, FNP  butalbital-acetaminophen-caffeine (FIORICET) [phone] MG tablet Take 1-2 tablets by mouth daily as needed for headache. Limit use no more 2x per week 10/03/21   Berniece Salines, FNP  co-enzyme Q-10 30 MG capsule Take 30 mg by mouth 3 (three) times daily.    [provider]  ipratropium (ATROVENT) 0.03 % nasal spray Place 2 sprays into both nostrils every 12 (twelve) hours. 10/27/22   Waldon Merl, PA-C  MAGNESIUM PO Take by mouth.    [provider]  Omega-3 Fatty Acids (FISH OIL OMEGA-3 PO) Take by mouth.    [provider]  Semaglutide-Weight Management 1 MG/0.5ML SOAJ Inject 1 mg into the skin once a week for 28 days. 10/18/22  11/15/22  Berniece Salines, FNP  Semaglutide-Weight Management 1.7 MG/0.75ML SOAJ Inject 1.7 mg into the skin once a week for 28 days. 11/16/22 12/14/22  Berniece Salines, FNP  venlafaxine XR (EFFEXOR-XR) 75 MG 24 hr capsule TAKE 1 CAPSULE BY MOUTH DAILY WITH BREAKFAST. 08/31/22   Berniece Salines, FNP    Family History Family History  Problem Relation Age of Onset   Cancer Mother        MM   Arthritis Mother    Hyperlipidemia Mother    Hypertension Mother    Anxiety disorder Mother    Arthritis Father    Cancer Maternal Grandmother        lymphoma 2x   Hypertension Maternal Grandmother    Hypertension Maternal Grandfather    Diabetes Maternal Grandfather    Cancer Paternal Grandfather        skin cancer    Breast cancer Neg Hx     Social History Social History   Tobacco Use   Smoking status: Never   Smokeless tobacco: Never  Vaping Use   Vaping Use: Never used  Substance Use Topics   Alcohol use: Yes   Drug use: No     Allergies   Sumatriptan   Review of Systems Review of Systems as noted above in HPI.  Other systems reviewed and found to be negative   Physical Exam Triage Vital Signs ED Triage Vitals  Enc Vitals Group     BP 10/28/22 0855 (!) 176/110     Pulse Rate 10/28/22 0855 81     Resp 10/28/22 0855 16     Temp 10/28/22 0855 98.3 F (36.8 C)     Temp Source 10/28/22 0855 Oral     SpO2 10/28/22 0855 97 %     Weight --      Height --      Head Circumference --      Peak Flow --      Pain Score 10/28/22 0853 1     Pain Loc --      Pain Edu? --      Excl. in GC? --    No data found.  Updated Vital Signs BP (!) 176/110 (BP Location: Left Wrist)   Pulse 81   Temp 98.3 F (36.8 C) (Oral)   Resp 16   LMP 10/17/2022   SpO2 97%   Visual Acuity Right Eye Distance:   Left Eye Distance:   Bilateral Distance:    Right Eye Near:   Left Eye Near:  Bilateral Near:     Physical Exam Constitutional:      Appearance: Normal appearance.  HENT:      Right Ear: Tympanic membrane and external ear normal.     Left Ear: Tympanic membrane and external ear normal.     Nose: Congestion present.     Mouth/Throat:     Mouth: Mucous membranes are moist.     Pharynx: No oropharyngeal exudate or posterior oropharyngeal erythema.  Cardiovascular:     Rate and Rhythm: Normal rate and regular rhythm.     Heart sounds: Normal heart sounds.  Pulmonary:     Effort: Pulmonary effort is normal.     Comments: Some forced upper airway wheezing present but no wheezing noted on lung auscultation.  No rhonchi.  Cough with deep inspiration Musculoskeletal:     Right upper leg: Normal.     Left upper leg: Tenderness present.       Legs:     Comments: Some tenderness along the mid thigh, some with palpation.  No pain to palpation of the calf muscles, with or without passive foot flexion.  Neurological:     Mental Status: She is alert.      UC Treatments / Results  Labs (all labs ordered are listed, but only abnormal results are displayed) Labs Reviewed  D-DIMER, QUANTITATIVE    EKG   Radiology DG Chest 2 View  Result Date: 10/28/2022 CLINICAL DATA:  productive cough EXAM: CHEST - 2 VIEW COMPARISON:  None Available. FINDINGS: The heart and mediastinal contours are within normal limits. Right lower lobe patchy airspace opacity. No pulmonary edema. No pleural effusion. No pneumothorax. No acute osseous abnormality. IMPRESSION: Right lower lobe patchy airspace opacity. Followup PA and lateral chest X-ray is recommended in 3-4 weeks following therapy to ensure resolution and exclude underlying malignancy. Electronically Signed   By: Tish Frederickson M.D.   On: 10/28/2022 09:35    Procedures Procedures (including critical care time)  Medications Ordered in UC Medications - No data to display  Initial Impression / Assessment and Plan / UC Course  I have reviewed the triage vital signs and the nursing notes.  Pertinent labs & imaging results that  were available during my care of the patient were reviewed by me and considered in my medical decision making (see chart for details).    Patient is a 47 year old female who presents with chief complaint of productive cough since Sunday but no fevers.  She does report some thigh pain that started today.  Cough has been productive.  She had a e-visit yesterday and was given a prescription for albuterol, Tessalon Perles, and Atrovent nasal spray.  Patient states that she did 2 puffs of the inhaler without improvement.  Patient reports she just had the by pick him up this morning.  Patient does not any birth control and she does not smoke.  Patient denies any recent long-term travel.  Patient has no pain to palpation of the calf muscle, with or without foot flexion.  Patient is low risk for DVT and explained as such.  Patient did however request to go and have blood test done for possible clot.  Will check a D-dimer.  Chest x-ray for her productive cough.  Patient blood pressure was elevated today, 176/110.  She does have a prescription for Norvasc.  Have her follow-up with primary care if her blood pressures are typically elevated.  Again low concern for DVT.  Requesting a blood lab check.  It is a  send out.  Have her follow-up her MyChart for the result.  If elevated would have her follow-up with her primary care.  Chest x-ray with patchy right lower lobe opacity.  Give her prescription for azithromycin.  Have her follow-up with primary care for repeat x-ray to make sure clearance.  Final Clinical Impressions(s) / UC Diagnoses   Final diagnoses:  Cough, unspecified type  Right thigh pain  Chest congestion  Community acquired pneumonia of right lower lobe of lung     Discharge Instructions      -Chest x-ray showed a opacity in the right lower lobe concerning for possible pneumonia.  Given prescription for azithromycin.  Will need to follow-up with your primary care doctor in 3-4 weeks to ensure  that the opacity has cleared with the antibiotics. -There is low concern for a blood clot given your symptoms. D-dimer test has been drawn and will be sent to the hospital lab. Follow your MyChart for results. If it is elevated, you should contact your PCP for further evaluation -Promethazine DM: 5 mL every 4 hours as needed for cough.  This medication may make you drowsy -Would continue the albuterol inhaler for coughing fits and wheezing -You can add Flonase nasal spray in the morning and an oral allergy medication at night (Zyrtec, Allegra, Claritin) to help with nasal congestion -You can use over-the-counter counter Mucinex to help with the chest congestion     ED Prescriptions     Medication Sig Dispense Auth. Provider   promethazine-dextromethorphan (PROMETHAZINE-DM) 6.25-15 MG/5ML syrup Take 5 mLs by mouth 4 (four) times daily as needed for cough. 118 mL Candis Schatz, PA-C   azithromycin (ZITHROMAX Z-PAK) 250 MG tablet Take 2 tablets by mouth the first day followed by one tablet daily for next 4 days. 6 tablet Candis Schatz, PA-C      PDMP not reviewed this encounter.   Candis Schatz, PA-C 10/28/22 [phone]

## 2022-10-28 NOTE — ED Triage Notes (Signed)
Pt c/o cough, chest congestion and wheezing x 4 days. She had an e-visit last night and was prescribed an inhaler and nasal spray.   Pt has also left thigh pain that started this morning.

## 2022-10-28 NOTE — Discharge Instructions (Addendum)
-  Chest x-ray showed a opacity in the right lower lobe concerning for possible pneumonia.  Given prescription for azithromycin.  Will need to follow-up with your primary care doctor in 3-4 weeks to ensure that the opacity has cleared with the antibiotics. -There is low concern for a blood clot given your symptoms. D-dimer test has been drawn and will be sent to the hospital lab. Follow your MyChart for results. If it is elevated, you should contact your PCP for further evaluation -Promethazine DM: 5 mL every 4 hours as needed for cough.  This medication may make you drowsy -Would continue the albuterol inhaler for coughing fits and wheezing -You can add Flonase nasal spray in the morning and an oral allergy medication at night (Zyrtec, Allegra, Claritin) to help with nasal congestion -You can use over-the-counter counter Mucinex to help with the chest congestion

## 2022-11-04 ENCOUNTER — Telehealth: Payer: BLUE CROSS/BLUE SHIELD | Admitting: Family Medicine

## 2022-11-04 DIAGNOSIS — R062 Wheezing: Secondary | ICD-10-CM

## 2022-11-04 NOTE — Progress Notes (Signed)
Because given the chance that your pneumonia might be worse- you need to be seen in person. So your condition warrants further evaluation and I recommend that you be seen in a face to face visit.   NOTE: There will be NO CHARGE for this eVisit

## 2022-11-06 ENCOUNTER — Telehealth: Payer: Self-pay | Admitting: Nurse Practitioner

## 2022-11-06 ENCOUNTER — Other Ambulatory Visit: Payer: Self-pay | Admitting: Nurse Practitioner

## 2022-11-06 DIAGNOSIS — Z8701 Personal history of pneumonia (recurrent): Secondary | ICD-10-CM

## 2022-11-06 NOTE — Telephone Encounter (Signed)
Called and made pt aware.

## 2022-11-06 NOTE — Telephone Encounter (Unsigned)
Copied from CRM [id number]. Topic: Appointment Scheduling - Scheduling Inquiry for Clinic >> Nov 06, 2022 10:52 AM Dondra Prader A wrote: Reason for CRM: Pt states that she had pneumonia and was told to get another chest x ray in 4 weeks to make sure every thing has cleared up. Please call pt back.

## 2022-11-07 ENCOUNTER — Ambulatory Visit
Admission: EM | Admit: 2022-11-07 | Discharge: 2022-11-07 | Disposition: A | Discharge status: Home / Self Care | Payer: BLUE CROSS/BLUE SHIELD | Attending: Urgent Care | Admitting: Urgent Care

## 2022-11-07 ENCOUNTER — Telehealth: Payer: BLUE CROSS/BLUE SHIELD | Admitting: Nurse Practitioner

## 2022-11-07 DIAGNOSIS — R110 Nausea: Secondary | ICD-10-CM | POA: Diagnosis not present

## 2022-11-07 DIAGNOSIS — R141 Gas pain: Secondary | ICD-10-CM

## 2022-11-07 DIAGNOSIS — R143 Flatulence: Secondary | ICD-10-CM

## 2022-11-07 DIAGNOSIS — K2900 Acute gastritis without bleeding: Secondary | ICD-10-CM | POA: Diagnosis not present

## 2022-11-07 DIAGNOSIS — R140 Abdominal distension (gaseous): Secondary | ICD-10-CM | POA: Diagnosis not present

## 2022-11-07 DIAGNOSIS — R197 Diarrhea, unspecified: Secondary | ICD-10-CM | POA: Diagnosis not present

## 2022-11-07 DIAGNOSIS — R142 Eructation: Secondary | ICD-10-CM

## 2022-11-07 MED ORDER — ONDANSETRON HCL 4 MG PO TABS: 4.0000 mg | ORAL_TABLET | Freq: Three times a day (TID) | ORAL | 0 refills | Status: DC | PRN

## 2022-11-07 MED ORDER — SUCRALFATE 1 G PO TABS: 1.0000 g | ORAL_TABLET | Freq: Three times a day (TID) | ORAL | 0 refills | Status: DC

## 2022-11-07 MED ORDER — ALUMINUM-MAGNESIUM-SIMETHICONE 200-200-20 MG/5ML PO SUSP: 15.0000 mL | Freq: Three times a day (TID) | ORAL | 0 refills | Status: DC

## 2022-11-07 NOTE — Progress Notes (Signed)
E-Visit for Nausea and Vomiting   We are sorry that you are not feeling well. Here is how we plan to help!  Based on what you have shared with me it looks like you have a Virus that is irritating your GI tract.  Vomiting is the forceful emptying of a portion of the stomach's content through the mouth.  Although nausea and vomiting can make you feel miserable, it's important to remember that these are not diseases, but rather symptoms of an underlying illness.  When we treat short term symptoms, we always caution that any symptoms that persist should be fully evaluated in a medical office.  I have prescribed a medication that will help alleviate your symptoms and allow you to stay hydrated:  Zofran 4 mg 1 tablet every 8 hours as needed for nausea and vomiting- you can also use TUMS for the acid burps you describes.  HOME CARE: Drink clear liquids.  This is very important! Dehydration (the lack of fluid) can lead to a serious complication.  Start off with 1 tablespoon every 5 minutes for 8 hours. You may begin eating bland foods after 8 hours without vomiting.  Start with saltine crackers, white bread, rice, mashed potatoes, applesauce. After 48 hours on a bland diet, you may resume a normal diet. Try to go to sleep.  Sleep often empties the stomach and relieves the need to vomit.  GET HELP RIGHT AWAY IF:  Your symptoms do not improve or worsen within 2 days after treatment. You have a fever for over 3 days. You cannot keep down fluids after trying the medication.  MAKE SURE YOU:  Understand these instructions. Will watch your condition. Will get help right away if you are not doing well or get worse.    Thank you for choosing an e-visit.  Your e-visit answers were reviewed by a board certified advanced clinical practitioner to complete your personal care plan. Depending upon the condition, your plan could have included both over the counter or prescription medications.  Please review  your pharmacy choice. Make sure the pharmacy is open so you can pick up prescription now. If there is a problem, you may contact your provider through [employer] and have the prescription routed to another pharmacy.  Your safety is important to [place]. If you have drug allergies check your prescription carefully.   For the next 24 hours you can use MyChart to ask questions about today's visit, request a non-urgent call back, or ask for a work or school excuse. You will get an email in the next two days asking about your experience. I hope that your e-visit has been valuable and will speed your recovery.  Mary-Margaret Daphine Deutscher, FNP   5-10 minutes spent reviewing and documenting in chart.

## 2022-11-07 NOTE — ED Triage Notes (Addendum)
Patient to Urgent Care with complaints of nausea/ abdominal fullness.  States she had diarrhea two weeks ago, pneumonia one week ago (prescribed a z-pack, finished 4/28), then had another stomach bug (vomiting and diarrhea) on Monday. Reports since Monday she has had a continuous fullness in her stomach. Constant nausea and loose stools. States she is also having a foul taste when she burps.   Prescribed Zofran this morning via an e-visit but has not relieved her symptoms.

## 2022-11-07 NOTE — Addendum Note (Signed)
Addended by: Bennie Pierini on: 11/07/2022 10:20 AM   Modules accepted: Orders

## 2022-11-07 NOTE — Discharge Instructions (Signed)
Please keep your follow-up appointment with your primary on Monday. She will perform an H. pylori test for you at that time. Please be fasting for the lab. Do not take any over-the-counter proton pump inhibitor such as omeprazole or Nexium.  Take the sucralfate prescribed today 4 times daily with meals and at bedtime. May take up to 15 mL 4 times daily of the Maalox as needed.

## 2022-11-08 NOTE — ED Provider Notes (Signed)
Shannon Clayton    CSN: [id number] Arrival date & time: 11/07/22  1510      History   Chief Complaint Chief Complaint  Patient presents with   Nausea    HPI Shannon Clayton is a 47 y.o. female.   Pleasant 47yo female presents today with complaints of nausea and epigastric fullness. Pt reports that 2+ weeks ago, she had a GI bug associated with profuse diarrhea. Felt as though she got over this, but then was dx with pneumonia and given Azithromycin one week ago. All pneumonia sx resolved and abx are completed. Pt then developed vomiting and diarrhea this past week on Monday. States the vomiting hsa resolved, but is left with persistent nausea and epigastric fullness, with looser stools. She feels slightly distended/ bloated. She admits to excessive eructation and flatulence. She states she has a sour taste in her mouth with the burping, but denies a heartburn like feeling. She has tried OTC tums. She did an evisit this morning and was prescribed zofran, but feels that has not helped with her symptoms. Denies fevers. No severe abdominal pain. No noted blood in vomit or stools. Has hx of HTN, usually controlled with meds, but pt has not taken her medication in several days due to the nausea. Denies a possible food source, no recent travel. No one else with GI sx around her.     Past Medical History:  Diagnosis Date   Depression    Frequent headaches    Hypertension    Migraine headache with aura     Patient Active Problem List   Diagnosis Date Noted   Hyperlipidemia 07/10/2022   Mild episode of recurrent major depressive disorder (HCC) 11/03/2021   Hip pain 10/03/2021   ASCUS with positive high risk HPV cervical 07/27/2019   Elevated liver enzymes 06/23/2019   Annual physical exam 06/23/2019   Prediabetes 06/09/2019   Vitamin D deficiency 06/09/2019   Migraine with aura and without status migrainosus, not intractable 05/28/2019   Essential hypertension 05/28/2019    Anxiety and depression 05/28/2019   Morbid obesity (HCC) 05/10/2014   Irregular periods 05/10/2014   Anxiety 11/21/2012    Past Surgical History:  Procedure Laterality Date   CESAREAN SECTION  2006   ear pinning  1984   TONSILLECTOMY AND ADENOIDECTOMY  2011    OB History     Gravida  1   Para  1   Term  1   Preterm      AB      Living  1      SAB      IAB      Ectopic      Multiple      Live Births  1            Home Medications    Prior to Admission medications   Medication Sig Start Date End Date Taking? Authorizing Provider  aluminum-magnesium hydroxide-simethicone (MAALOX) 200-200-20 MG/5ML SUSP Take 15 mLs by mouth 4 (four) times daily -  before meals and at bedtime. 11/07/22  Yes Jahbari Repinski L, PA  sucralfate (CARAFATE) 1 g tablet Take 1 tablet (1 g total) by mouth 4 (four) times daily -  with meals and at bedtime for 5 days. 11/07/22 11/12/22 Yes Virat Prather L, PA  albuterol (VENTOLIN HFA) 108 (90 Base) MCG/ACT inhaler Inhale 2 puffs into the lungs every 6 (six) hours as needed for wheezing or shortness of breath. 10/27/22   Waldon Merl, PA-C  amLODipine (NORVASC) 5 MG tablet TAKE 1 TABLET (5 MG TOTAL) BY MOUTH DAILY. 10/27/22   Berniece Salines, FNP  busPIRone (BUSPAR) 5 MG tablet TAKE 1 TABLET BY MOUTH THREE TIMES A DAY AS NEEDED 09/29/22   Berniece Salines, FNP  butalbital-acetaminophen-caffeine (FIORICET) [phone] MG tablet Take 1-2 tablets by mouth daily as needed for headache. Limit use no more 2x per week 10/03/21   Berniece Salines, FNP  co-enzyme Q-10 30 MG capsule Take 30 mg by mouth 3 (three) times daily.    [provider]  ipratropium (ATROVENT) 0.03 % nasal spray Place 2 sprays into both nostrils every 12 (twelve) hours. 10/27/22   Waldon Merl, PA-C  MAGNESIUM PO Take by mouth.    [provider]  Omega-3 Fatty Acids (FISH OIL OMEGA-3 PO) Take by mouth.    [provider]  ondansetron (ZOFRAN) 4 MG tablet  Take 1 tablet (4 mg total) by mouth every 8 (eight) hours as needed for nausea or vomiting. 11/07/22   Bennie Pierini, FNP  Semaglutide-Weight Management 1 MG/0.5ML SOAJ Inject 1 mg into the skin once a week for 28 days. 10/18/22 11/15/22  Berniece Salines, FNP  Semaglutide-Weight Management 1.7 MG/0.75ML SOAJ Inject 1.7 mg into the skin once a week for 28 days. 11/16/22 12/14/22  Berniece Salines, FNP  venlafaxine XR (EFFEXOR-XR) 75 MG 24 hr capsule TAKE 1 CAPSULE BY MOUTH DAILY WITH BREAKFAST. 08/31/22   Berniece Salines, FNP    Family History Family History  Problem Relation Age of Onset   Cancer Mother        MM   Arthritis Mother    Hyperlipidemia Mother    Hypertension Mother    Anxiety disorder Mother    Arthritis Father    Cancer Maternal Grandmother        lymphoma 2x   Hypertension Maternal Grandmother    Hypertension Maternal Grandfather    Diabetes Maternal Grandfather    Cancer Paternal Grandfather        skin cancer    Breast cancer Neg Hx     Social History Social History   Tobacco Use   Smoking status: Never   Smokeless tobacco: Never  Vaping Use   Vaping Use: Never used  Substance Use Topics   Alcohol use: Yes   Drug use: No     Allergies   Sumatriptan   Review of Systems Review of Systems As per hpi  Physical Exam Triage Vital Signs ED Triage Vitals  Enc Vitals Group     BP 11/07/22 1538 (!) 175/98     Pulse Rate 11/07/22 1522 (!) 103     Resp 11/07/22 1522 18     Temp 11/07/22 1522 97.7 F (36.5 C)     Temp src --      SpO2 11/07/22 1522 96 %     Weight --      Height --      Head Circumference --      Peak Flow --      Pain Score 11/07/22 1527 0     Pain Loc --      Pain Edu? --      Excl. in GC? --    No data found.  Updated Vital Signs BP (!) 175/98   Pulse (!) 103   Temp 97.7 F (36.5 C)   Resp 18   LMP 10/17/2022   SpO2 96%   Visual Acuity Right Eye Distance:   Left  Eye Distance:   Bilateral Distance:    Right  Eye Near:   Left Eye Near:    Bilateral Near:     Physical Exam Vitals and nursing note reviewed.  Constitutional:      General: She is not in acute distress.    Appearance: Normal appearance. She is well-developed. She is obese. She is not ill-appearing, toxic-appearing or diaphoretic.  HENT:     Head: Normocephalic and atraumatic.     Nose: Nose normal. No congestion or rhinorrhea.     Mouth/Throat:     Mouth: Mucous membranes are moist.     Pharynx: Oropharynx is clear. No oropharyngeal exudate or posterior oropharyngeal erythema.  Eyes:     General: No scleral icterus.       Right eye: No discharge.        Left eye: No discharge.     Conjunctiva/sclera: Conjunctivae normal.     Pupils: Pupils are equal, round, and reactive to light.  Cardiovascular:     Rate and Rhythm: Regular rhythm. Tachycardia present.     Heart sounds: No murmur heard. Pulmonary:     Effort: Pulmonary effort is normal. No respiratory distress.     Breath sounds: Normal breath sounds. No stridor. No wheezing, rhonchi or rales.  Chest:     Chest wall: No tenderness.  Abdominal:     General: Abdomen is flat. Bowel sounds are normal. There is distension (minimal).     Palpations: Abdomen is soft. There is no mass.     Tenderness: There is no abdominal tenderness. There is no right CVA tenderness, left CVA tenderness, guarding or rebound.  Musculoskeletal:        General: No swelling.     Cervical back: Normal range of motion and neck supple. No rigidity.  Lymphadenopathy:     Cervical: No cervical adenopathy.  Skin:    General: Skin is warm and dry.     Capillary Refill: Capillary refill takes less than 2 seconds.     Findings: No erythema or rash.  Neurological:     General: No focal deficit present.     Mental Status: She is alert and oriented to person, place, and time.     Motor: No weakness.  Psychiatric:        Mood and Affect: Mood normal.      UC Treatments / Results  Labs (all labs  ordered are listed, but only abnormal results are displayed) Labs Reviewed - No data to display  EKG   Radiology No results found.  Procedures Procedures (including critical care time)  Medications Ordered in UC Medications - No data to display  Initial Impression / Assessment and Plan / UC Course  I have reviewed the triage vital signs and the nursing notes.  Pertinent labs & imaging results that were available during my care of the patient were reviewed by me and considered in my medical decision making (see chart for details).     Abdominal bloating - Given pt's current sx, I have clinical concern for possible H pylori. Unfortunately do not have the capacity to perform H pylori breath testing in Urgent Care, but was able to get in contact with PCP who states she can complete this at pt's follow up visit scheduled for Monday. Will try carafate to help with these sx, but encouraged pt to avoid PPIs as this will affect her test. Pt encouraged to arrive fasting for her H pylori test. Flatulence - will do supportive care  with simethicone/ maalox Nausea - pt has zofran from this morning. Encouraged pt to use her zofran as needed in order to tolerate her normal medications - must take BP medication daily to prevent elevations. Diarrhea - supportive care. Has follow up with PCP on Monday.    Final Clinical Impressions(s) / UC Diagnoses   Final diagnoses:  Abdominal bloating  Flatulence, eructation and gas pain  Nausea  Diarrhea, unspecified type     Discharge Instructions      Please keep your follow-up appointment with your primary on Monday. She will perform an H. pylori test for you at that time. Please be fasting for the lab. Do not take any over-the-counter proton pump inhibitor such as omeprazole or Nexium.  Take the sucralfate prescribed today 4 times daily with meals and at bedtime. May take up to 15 mL 4 times daily of the Maalox as needed.    ED Prescriptions      Medication Sig Dispense Auth. Provider   sucralfate (CARAFATE) 1 g tablet Take 1 tablet (1 g total) by mouth 4 (four) times daily -  with meals and at bedtime for 5 days. 20 tablet Tellis Spivak L, PA   aluminum-magnesium hydroxide-simethicone (MAALOX) 200-200-20 MG/5ML SUSP Take 15 mLs by mouth 4 (four) times daily -  before meals and at bedtime. 120 mL Ernie Sagrero L, PA      PDMP not reviewed this encounter.   Maretta Bees, [place] 11/08/22 1730

## 2022-11-09 ENCOUNTER — Encounter: Payer: Self-pay | Admitting: Nurse Practitioner

## 2022-11-09 ENCOUNTER — Ambulatory Visit: Payer: BLUE CROSS/BLUE SHIELD | Admitting: Nurse Practitioner

## 2022-11-09 ENCOUNTER — Other Ambulatory Visit: Payer: Self-pay

## 2022-11-09 DIAGNOSIS — R142 Eructation: Secondary | ICD-10-CM

## 2022-11-09 DIAGNOSIS — R197 Diarrhea, unspecified: Secondary | ICD-10-CM

## 2022-11-09 DIAGNOSIS — F419 Anxiety disorder, unspecified: Secondary | ICD-10-CM | POA: Diagnosis not present

## 2022-11-09 DIAGNOSIS — F330 Major depressive disorder, recurrent, mild: Secondary | ICD-10-CM

## 2022-11-09 DIAGNOSIS — R1084 Generalized abdominal pain: Secondary | ICD-10-CM

## 2022-11-09 DIAGNOSIS — R7303 Prediabetes: Secondary | ICD-10-CM

## 2022-11-09 DIAGNOSIS — G43109 Migraine with aura, not intractable, without status migrainosus: Secondary | ICD-10-CM

## 2022-11-09 DIAGNOSIS — I10 Essential (primary) hypertension: Secondary | ICD-10-CM

## 2022-11-09 DIAGNOSIS — R143 Flatulence: Secondary | ICD-10-CM

## 2022-11-09 DIAGNOSIS — E785 Hyperlipidemia, unspecified: Secondary | ICD-10-CM

## 2022-11-09 DIAGNOSIS — E559 Vitamin D deficiency, unspecified: Secondary | ICD-10-CM

## 2022-11-09 LAB — CBC WITH DIFFERENTIAL/PLATELET
MCV: 86.4 fL (ref 80.0–100.0)
MPV: 10.1 fL (ref 7.5–12.5)
Neutrophils Relative %: 61.8 %

## 2022-11-09 NOTE — Assessment & Plan Note (Signed)
Continue working on lifestyle modification.  

## 2022-11-09 NOTE — Assessment & Plan Note (Signed)
Continue taking vitamin D supplement.  Will check level today.

## 2022-11-09 NOTE — Assessment & Plan Note (Signed)
Continue taking amlodipine 5 mg daily 

## 2022-11-09 NOTE — Assessment & Plan Note (Signed)
Has been working on lifestyle modification.  Will check labs today.

## 2022-11-09 NOTE — Progress Notes (Signed)
BP 132/78   Pulse 97   Temp 98.2 F (36.8 C)   Resp 16   Ht 6' (1.829 m)   Wt (!) 334 lb 4.8 oz (151.6 kg)   LMP 10/17/2022   SpO2 98%   BMI 45.34 kg/m    Subjective:    Patient ID: Shannon Clayton, female    DOB: 06/18/1976, 47 y.o.   MRN: [id number]  HPI: Shannon Clayton is a 47 y.o. female, here alone  Chief Complaint  Patient presents with   Hypertension   Obesity   Migraine    4 month follow up   Follow-up    Urgent Care/e visit    Migraines: patient reports she does get auras with her migraines.  She uses Fioricet 50-325-40 mg as needed for migraines. She has not had a migraine for awhile.   HTN: her blood pressure today is 132/78.  She currently takes amlodipine 5 mg daily.  She denies any shortness of breath, headaches, blurred vision or chest pain.   Morbid obesity: patient current weight is 334 lbs with a BMI of 45.34.  She is currently on wegovy for weight loss. She started it on 08/14/2022 and her starting weight was 328 lbs.  Discussed likely switching to zepbound, if H. pylori comes back negative.   Vitamin D deficiency: patient continues to take vitamin d supplement.  Her last vitamin D was 33 on 10/03/2021.   Hyperlipidemia: she is working on lifestyle modification.  Her last LDL was 94 on 03/06/2022.   The 10-year ASCVD risk score (Arnett DK, et al., 2019) is: 1.1%   Values used to calculate the score:     Age: 61 years     Sex: Female     Is Non-Hispanic African American: No     Diabetic: No     Tobacco smoker: No     Systolic Blood Pressure: 132 mmHg     Is BP treated: Yes     HDL Cholesterol: 44 mg/dL     Total Cholesterol: 162 mg/dL   prediabetes:  Her last A1C was 5.6 on 03/06/2022. She continues to work on lifestyle modifications.   Depression and anxiety: she currently takes effexor 75 mg daily and buspar as needed.  Her PHQ9 and GAD scores are negative.      11/09/2022    3:38 PM 11/09/2022    3:36 PM 09/18/2022    8:08 AM 07/10/2022     3:24 PM 03/06/2022    3:12 PM  Depression screen PHQ 2/9  Decreased Interest 0 0 0 0 0  Down, Depressed, Hopeless 0 0 0 1 0  PHQ - 2 Score 0 0 0 1 0  Altered sleeping 0  0 0 2  Tired, decreased energy 0  0 0 0  Change in appetite 0  0 0 0  Feeling bad or failure about yourself  0  0 0 1  Trouble concentrating 0  0 0 0  Moving slowly or fidgety/restless 0  0 0 0  Suicidal thoughts 0  0 0 0  PHQ-9 Score 0  0 1 3  Difficult doing work/chores Not difficult at all  Not difficult at all Not difficult at all Not difficult at all       11/09/2022    3:38 PM 09/18/2022    8:08 AM 03/06/2022    3:12 PM 10/03/2021    3:02 PM  GAD 7 : Generalized Anxiety Score  Nervous, Anxious, on Edge 1 0  2 3  Control/stop worrying 1 0 2 3  Worry too much - different things 0 0 2 2  Trouble relaxing 1 0 1 0  Restless 0 0 1 0  Easily annoyed or irritable 0 0 0 1  Afraid - awful might happen 0 0 0 1  Total GAD 7 Score 3 0 8 10  Anxiety Difficulty  Not difficult at all Not difficult at all Not difficult at all   Abdominal pain/ belching/ diarrhea/flatulence : she was recently seen at urgent care for abdominal bloating yesterday.  Provider had reached out to me and requested H. Pylori testing. Will get H. Pylori testing done.  Patient was prescribed carafate for her symptoms yesterday at urgent care. Discussed with patient at length that the symptoms could be associated with wegovy.  Will get h.pylori test , discussed treatment plan if positive and if negative. She says the carafate has helped.    Relevant past medical, surgical, family and social history reviewed and updated as indicated. Interim medical history since our last visit reviewed. Allergies and medications reviewed and updated.  Review of Systems  Constitutional: Negative for fever or weight change.  Respiratory: Negative for cough and shortness of breath.   Cardiovascular: Negative for chest pain or palpitations.  Gastrointestinal: positive for  abdominal pain, diarrhea Musculoskeletal: Negative for gait problem or joint swelling.  Skin: Negative for rash.  Neurological: Negative for dizziness or headache.  No other specific complaints in a complete review of systems (except as listed in HPI above).      Objective:    BP 132/78   Pulse 97   Temp 98.2 F (36.8 C)   Resp 16   Ht 6' (1.829 m)   Wt (!) 334 lb 4.8 oz (151.6 kg)   LMP 10/17/2022   SpO2 98%   BMI 45.34 kg/m   Wt Readings from Last 3 Encounters:  11/09/22 (!) 334 lb 4.8 oz (151.6 kg)  09/18/22 (!) 330 lb (149.7 kg)  07/10/22 (!) 328 lb 14.4 oz (149.2 kg)    Physical Exam  Constitutional: Patient appears well-developed and well-nourished. Obese  No distress.  HEENT: head atraumatic, normocephalic, pupils equal and reactive to light,  neck supple Cardiovascular: Normal rate, regular rhythm and normal heart sounds.  No murmur heard. No BLE edema. Pulmonary/Chest: Effort normal and breath sounds normal. No respiratory distress. Abdominal: Soft.  There is no tenderness. Psychiatric: Patient has a normal mood and affect. behavior is normal. Judgment and thought content normal.   Results for orders placed or performed during the hospital encounter of 10/28/22  D-dimer, quantitative  Result Value Ref Range   D-Dimer, Quant <0.27 0.00 - 0.50 ug/mL-FEU      Assessment & Plan:   Problem List Items Addressed This Visit       Cardiovascular and Mediastinum   Migraine with aura and without status migrainosus, not intractable    Doing well, takes Fioricet for migraine.        Essential hypertension    Continue taking amlodipine 5 mg daily      Relevant Orders   COMPLETE METABOLIC PANEL WITH GFR   CBC with Differential/Platelet     Other   Morbid obesity (HCC) - Primary     Wegovy but complaining of abdominal pain, flatulence, belching and diarrhea.  Could possibly be side effects from [facility].  Getting H. pylori test.  If negative will stop Wegovy and  see if symptoms resolve.  May need to switch to  stepdown.      Prediabetes    Has been working on lifestyle modification.  Will check labs today.      Relevant Orders   COMPLETE METABOLIC PANEL WITH GFR   Hemoglobin A1c   Vitamin D deficiency    Continue taking vitamin D supplement.  Will check level today.      Relevant Orders   VITAMIN D 25 Hydroxy (Vit-D Deficiency, Fractures)   Anxiety    Continue taking Effexor 75 mg daily.  And BuSpar as needed.      Mild episode of recurrent major depressive disorder (HCC)    Continue taking Effexor 75 mg daily.  And BuSpar as needed.      Hyperlipidemia    Continue working on lifestyle modification.      Relevant Orders   Lipid panel   Other Visit Diagnoses     Generalized abdominal pain       will get H. Pylori, discussed could be wegovy. treatment will be determined after results.   Relevant Orders   H. pylori breath test   Flatulence       will get H. Pylori, discussed could be wegovy. treatment will be determined after results.   Relevant Orders   H. pylori breath test   Diarrhea, unspecified type       will get H. Pylori, discussed could be wegovy. treatment will be determined after results.   Relevant Orders   H. pylori breath test   Belching       will get H. Pylori, discussed could be wegovy. treatment will be determined after results.   Relevant Orders   H. pylori breath test        Follow up plan: Return in about 6 months (around 05/12/2023) for follow up.

## 2022-11-09 NOTE — Assessment & Plan Note (Signed)
  [facility] but complaining of abdominal pain, flatulence, belching and diarrhea.  Could possibly be side effects from [facility].  Getting H. pylori test.  If negative will stop Wegovy and see if symptoms resolve.  May need to switch to stepdown.

## 2022-11-09 NOTE — Assessment & Plan Note (Signed)
Continue taking Effexor 75 mg daily.  And BuSpar as needed.

## 2022-11-09 NOTE — Assessment & Plan Note (Signed)
Continue taking Effexor 75 mg daily.  And BuSpar as needed. 

## 2022-11-09 NOTE — Assessment & Plan Note (Signed)
Doing well, takes Fioricet for migraine.

## 2022-11-10 LAB — COMPLETE METABOLIC PANEL WITH GFR
Albumin: 4.1 g/dL (ref 3.6–5.1)
Calcium: 9.7 mg/dL (ref 8.6–10.2)
Chloride: 103 mmol/L (ref 98–110)
eGFR: 102 mL/min/{1.73_m2} (ref >=60)

## 2022-11-10 LAB — CBC WITH DIFFERENTIAL/PLATELET
Absolute Monocytes: 494 cells/uL (ref 200–950)
Basophils Absolute: 32 cells/uL (ref 0–200)
Basophils Relative: 0.3 %
HCT: 38.9 % (ref 35.0–45.0)
Hemoglobin: 13.2 g/dL (ref 11.7–15.5)
Neutro Abs: 6489 cells/uL (ref 1500–7800)
Platelets: 415 10*3/uL — ABNORMAL HIGH (ref 140–400)
RDW: 13.6 % (ref 11.0–15.0)
WBC: 10.5 10*3/uL (ref 3.8–10.8)

## 2022-11-10 LAB — VITAMIN D 25 HYDROXY (VIT D DEFICIENCY, FRACTURES): Vit D, 25-Hydroxy: 42 ng/mL (ref 30–100)

## 2022-11-10 LAB — LIPID PANEL
HDL: 51 mg/dL (ref >=50)
Total CHOL/HDL Ratio: 3.1 (calc) (ref <5.0)
Triglycerides: 97 mg/dL (ref <150)

## 2022-11-10 LAB — HEMOGLOBIN A1C: eAG (mmol/L): 6.2 mmol/L

## 2022-11-12 LAB — CBC WITH DIFFERENTIAL/PLATELET
Eosinophils Absolute: 924 cells/uL — ABNORMAL HIGH (ref 15–500)
Eosinophils Relative: 8.8 %
Lymphs Abs: 2562 cells/uL (ref 850–3900)
MCH: 29.3 pg (ref 27.0–33.0)
MCHC: 33.9 g/dL (ref 32.0–36.0)
Monocytes Relative: 4.7 %
RBC: 4.5 10*6/uL (ref 3.80–5.10)
Total Lymphocyte: 24.4 %

## 2022-11-12 LAB — LIPID PANEL
Cholesterol: 160 mg/dL (ref <200)
LDL Cholesterol (Calc): 90 mg/dL (calc)
Non-HDL Cholesterol (Calc): 109 mg/dL (calc) (ref <130)

## 2022-11-12 LAB — COMPLETE METABOLIC PANEL WITH GFR
AG Ratio: 1.6 (calc) (ref 1.0–2.5)
ALT: 23 U/L (ref 6–29)
AST: 22 U/L (ref 10–30)
Alkaline phosphatase (APISO): 67 U/L (ref 31–125)
BUN: 9 mg/dL (ref 7–25)
CO2: 28 mmol/L (ref 20–32)
Creat: 0.74 mg/dL (ref 0.50–0.99)
Globulin: 2.5 g/dL (calc) (ref 1.9–3.7)
Glucose, Bld: 85 mg/dL (ref 65–99)
Potassium: 4.1 mmol/L (ref 3.5–5.3)
Sodium: 138 mmol/L (ref 135–146)
Total Bilirubin: 0.4 mg/dL (ref 0.2–1.2)
Total Protein: 6.6 g/dL (ref 6.1–8.1)

## 2022-11-12 LAB — H. PYLORI BREATH TEST: H. pylori Breath Test: NOT DETECTED

## 2022-11-12 LAB — HEMOGLOBIN A1C
Hgb A1c MFr Bld: 5.5 % of total Hgb (ref <5.7)
Mean Plasma Glucose: 111 mg/dL

## 2022-11-13 ENCOUNTER — Encounter: Payer: Self-pay | Admitting: Nurse Practitioner

## 2022-11-27 ENCOUNTER — Ambulatory Visit (INDEPENDENT_AMBULATORY_CARE_PROVIDER_SITE_OTHER): Payer: BLUE CROSS/BLUE SHIELD | Admitting: Nurse Practitioner

## 2022-11-27 ENCOUNTER — Encounter: Payer: Self-pay | Admitting: Nurse Practitioner

## 2022-11-27 ENCOUNTER — Other Ambulatory Visit: Payer: Self-pay

## 2022-11-27 DIAGNOSIS — L989 Disorder of the skin and subcutaneous tissue, unspecified: Secondary | ICD-10-CM

## 2022-11-27 MED ORDER — ZEPBOUND 2.5 MG/0.5ML ~~LOC~~ SOAJ
2.5000 mg | SUBCUTANEOUS | 0 refills | Status: DC
Start: 2022-11-27 — End: 2023-05-13

## 2022-11-27 NOTE — Assessment & Plan Note (Signed)
Start zepbound 2.5 mg weekly, work on lifestyle modification.

## 2022-11-27 NOTE — Progress Notes (Signed)
BP 128/76   Pulse 92   Temp 98.4 F (36.9 C) (Oral)   Resp 16   Ht 6' (1.829 m)   Wt (!) 335 lb 8 oz (152.2 kg)   LMP 10/17/2022   SpO2 94%   BMI 45.50 kg/m    Subjective:    Patient ID: Shannon Clayton, female    DOB: 1976/04/28, 47 y.o.   MRN: [id number]  HPI: Shannon Clayton is a 47 y.o. female  Chief Complaint  Patient presents with   Rash    Spot on left shoulder has changed   Skin lesion: she has a lesion on her left shoulder that has been there for about three to four weeks. She says it just wont go away. She has an appointment with dermatology next month. Recommend she keep that appointment.    Obesity: she was recently taken off of wegovy due to side effects. She reports she is feeling better and would like to try zepbound. Her current weight is 335 lbs with a BMI of 45.50. discussed the possibility of having same side effects patient verbalized understanding and will monitor for side effects.  Co morbidities include hypertension, prediabetes, depression and anxiety.   Relevant past medical, surgical, family and social history reviewed and updated as indicated. Interim medical history since our last visit reviewed. Allergies and medications reviewed and updated.  Review of Systems  Constitutional: Negative for fever or weight change.  Respiratory: Negative for cough and shortness of breath.   Cardiovascular: Negative for chest pain or palpitations.  Gastrointestinal: Negative for abdominal pain, no bowel changes.  Musculoskeletal: Negative for gait problem or joint swelling.  Skin: Negative for rash. Lesion on left should about the size of a pencil eraser Neurological: Negative for dizziness or headache.  No other specific complaints in a complete review of systems (except as listed in HPI above).      Objective:    BP 128/76   Pulse 92   Temp 98.4 F (36.9 C) (Oral)   Resp 16   Ht 6' (1.829 m)   Wt (!) 335 lb 8 oz (152.2 kg)   LMP 10/17/2022    SpO2 94%   BMI 45.50 kg/m   Wt Readings from Last 3 Encounters:  11/27/22 (!) 335 lb 8 oz (152.2 kg)  11/09/22 (!) 334 lb 4.8 oz (151.6 kg)  09/18/22 (!) 330 lb (149.7 kg)    Physical Exam  Constitutional: Patient appears well-developed and well-nourished. Obese  No distress.  HEENT: head atraumatic, normocephalic, pupils equal and reactive to light, neck supple Cardiovascular: Normal rate, regular rhythm and normal heart sounds.  No murmur heard. No BLE edema. Pulmonary/Chest: Effort normal and breath sounds normal. No respiratory distress. Abdominal: Soft.  There is no tenderness. Skin:Lesion on left should about the size of a pencil eraser Psychiatric: Patient has a normal mood and affect. behavior is normal. Judgment and thought content normal.  Results for orders placed or performed in visit on 11/09/22  COMPLETE METABOLIC PANEL WITH GFR  Result Value Ref Range   Glucose, Bld 85 65 - 99 mg/dL   BUN 9 7 - 25 mg/dL   Creat 7.82 9.56 - 2.13 mg/dL   eGFR 086 > OR = 60 VH/QIO/9.62X5   BUN/Creatinine Ratio SEE NOTE: 6 - 22 (calc)   Sodium 138 135 - 146 mmol/L   Potassium 4.1 3.5 - 5.3 mmol/L   Chloride 103 98 - 110 mmol/L   CO2 28 20 - 32 mmol/L  Calcium 9.7 8.6 - 10.2 mg/dL   Total Protein 6.6 6.1 - 8.1 g/dL   Albumin 4.1 3.6 - 5.1 g/dL   Globulin 2.5 1.9 - 3.7 g/dL (calc)   AG Ratio 1.6 1.0 - 2.5 (calc)   Total Bilirubin 0.4 0.2 - 1.2 mg/dL   Alkaline phosphatase (APISO) 67 31 - 125 U/L   AST 22 10 - 30 U/L   ALT 23 6 - 29 U/L  CBC with Differential/Platelet  Result Value Ref Range   WBC 10.5 3.8 - 10.8 Thousand/uL   RBC 4.50 3.80 - 5.10 Million/uL   Hemoglobin 13.2 11.7 - 15.5 g/dL   HCT 16.1 09.6 - 04.5 %   MCV 86.4 80.0 - 100.0 fL   MCH 29.3 27.0 - 33.0 pg   MCHC 33.9 32.0 - 36.0 g/dL   RDW 40.9 81.1 - 91.4 %   Platelets 415 (H) 140 - 400 Thousand/uL   MPV 10.1 7.5 - 12.5 fL   Neutro Abs 6,489 1,500 - 7,800 cells/uL   Lymphs Abs 2,562 850 - 3,900 cells/uL    Absolute Monocytes 494 200 - 950 cells/uL   Eosinophils Absolute 924 (H) 15 - 500 cells/uL   Basophils Absolute 32 0 - 200 cells/uL   Neutrophils Relative % 61.8 %   Total Lymphocyte 24.4 %   Monocytes Relative 4.7 %   Eosinophils Relative 8.8 %   Basophils Relative 0.3 %  Lipid panel  Result Value Ref Range   Cholesterol 160 <200 mg/dL   HDL 51 > OR = 50 mg/dL   Triglycerides 97 <782 mg/dL   LDL Cholesterol (Calc) 90 mg/dL (calc)   Total CHOL/HDL Ratio 3.1 <5.0 (calc)   Non-HDL Cholesterol (Calc) 109 <130 mg/dL (calc)  Hemoglobin N5A  Result Value Ref Range   Hgb A1c MFr Bld 5.5 <5.7 % of total Hgb   Mean Plasma Glucose 111 mg/dL   eAG (mmol/L) 6.2 mmol/L  VITAMIN D 25 Hydroxy (Vit-D Deficiency, Fractures)  Result Value Ref Range   Vit D, 25-Hydroxy 42 30 - 100 ng/mL  H. pylori breath test  Result Value Ref Range   H. pylori Breath Test NOT DETECTED NOT DETECTED      Assessment & Plan:   Problem List Items Addressed This Visit       Other   Morbid obesity (HCC) - Primary    Start zepbound 2.5 mg weekly, work on lifestyle modification.       Relevant Medications   tirzepatide (ZEPBOUND) 2.5 MG/0.5ML Pen   Other Visit Diagnoses     Skin lesion       has appointmment with dermatology next month, recommend keeping that appointment        Follow up plan: Return in about 3 months (around 02/27/2023) for follow up.

## 2022-12-14 ENCOUNTER — Encounter: Payer: Self-pay | Admitting: Nurse Practitioner

## 2022-12-21 ENCOUNTER — Encounter: Payer: Self-pay | Admitting: Nurse Practitioner

## 2022-12-21 DIAGNOSIS — Z1211 Encounter for screening for malignant neoplasm of colon: Secondary | ICD-10-CM

## 2022-12-22 ENCOUNTER — Telehealth: Payer: Self-pay

## 2022-12-22 ENCOUNTER — Other Ambulatory Visit: Payer: Self-pay

## 2022-12-22 DIAGNOSIS — Z1211 Encounter for screening for malignant neoplasm of colon: Secondary | ICD-10-CM

## 2022-12-22 MED ORDER — NA SULFATE-K SULFATE-MG SULF 17.5-3.13-1.6 GM/177ML PO SOLN: 1.0000 | Freq: Once | ORAL | 0 refills | Status: AC

## 2022-12-22 NOTE — Telephone Encounter (Signed)
Gastroenterology Pre-Procedure Review  Request Date: 03/19/23 Requesting Physician: Dr. Tobi Bastos  PATIENT REVIEW QUESTIONS: The patient responded to the following health history questions as indicated:    1. Are you having any GI issues? no 2. Do you have a personal history of Polyps? no 3. Do you have a family history of Colon Cancer or Polyps? no 4. Diabetes Mellitus? no 5. Joint replacements in the past 12 months?no 6. Major health problems in the past 3 months?no 7. Any artificial heart valves, MVP, or defibrillator?no    MEDICATIONS & ALLERGIES:    Patient reports the following regarding taking any anticoagulation/antiplatelet therapy:   Plavix, Coumadin, Eliquis, Xarelto, Lovenox, Pradaxa, Brilinta, or Effient? no Aspirin? no  Patient confirms/reports the following medications:  Current Outpatient Medications  Medication Sig Dispense Refill   amLODipine (NORVASC) 5 MG tablet TAKE 1 TABLET (5 MG TOTAL) BY MOUTH DAILY. 90 tablet 1   busPIRone (BUSPAR) 5 MG tablet TAKE 1 TABLET BY MOUTH THREE TIMES A DAY AS NEEDED 270 tablet 0   butalbital-acetaminophen-caffeine (FIORICET) 50-325-40 MG tablet Take 1-2 tablets by mouth daily as needed for headache. Limit use no more 2x per week 30 tablet 2   co-enzyme Q-10 30 MG capsule Take 30 mg by mouth 3 (three) times daily.     ipratropium (ATROVENT) 0.03 % nasal spray Place 2 sprays into both nostrils every 12 (twelve) hours. 30 mL 0   MAGNESIUM PO Take by mouth.     meloxicam (MOBIC) 15 MG tablet Take 15 mg by mouth daily.     Omega-3 Fatty Acids (FISH OIL OMEGA-3 PO) Take by mouth.     venlafaxine XR (EFFEXOR-XR) 75 MG 24 hr capsule TAKE 1 CAPSULE BY MOUTH DAILY WITH BREAKFAST. 90 capsule 1   tirzepatide (ZEPBOUND) 2.5 MG/0.5ML Pen Inject 2.5 mg into the skin once a week. (Patient not taking: Reported on 12/22/2022) 2 mL 0   No current facility-administered medications for this visit.    Patient confirms/reports the following allergies:   Allergies  Allergen Reactions   Sumatriptan Other (See Comments)    myalgias  Myalgias; makes shoulders and neck hurt  Myalgias; makes shoulders and neck hurt    myalgias Myalgias; makes shoulders and neck hurt    No orders of the defined types were placed in this encounter.   AUTHORIZATION INFORMATION Primary Insurance: 1D#: Group #:  Secondary Insurance: 1D#: Group #:  SCHEDULE INFORMATION: Date: 03/19/23 Time: Location: [facility]

## 2022-12-23 ENCOUNTER — Other Ambulatory Visit: Payer: Self-pay | Admitting: Nurse Practitioner

## 2022-12-23 DIAGNOSIS — F419 Anxiety disorder, unspecified: Secondary | ICD-10-CM

## 2022-12-23 NOTE — Telephone Encounter (Signed)
Requested Prescriptions  Pending Prescriptions Disp Refills   busPIRone (BUSPAR) 5 MG tablet [Pharmacy Med Name: BUSPIRONE HCL 5 MG TABLET] 90 tablet 1    Sig: TAKE 1 TABLET BY MOUTH THREE TIMES A DAY AS NEEDED     Psychiatry: Anxiolytics/Hypnotics - Non-controlled Passed - 12/23/2022  2:18 AM      Passed - Valid encounter within last 12 months    Recent Outpatient Visits           3 weeks ago Morbid obesity [facility])   [facility] Berniece Salines, FNP   1 month ago Morbid obesity [facility])   [facility] Berniece Salines, FNP   3 months ago Morbid obesity [facility])   [facility] Berniece Salines, FNP   5 months ago Essential hypertension   [facility] Berniece Salines, FNP   9 months ago Essential hypertension   [facility] Berniece Salines, FNP       Future Appointments             In 4 months Zane Herald, Rudolpho Sevin, FNP [facility], [facility]   In 11 months Deirdre Evener, MD [facility]

## 2023-01-17 ENCOUNTER — Other Ambulatory Visit: Payer: Self-pay | Admitting: Nurse Practitioner

## 2023-01-17 DIAGNOSIS — F419 Anxiety disorder, unspecified: Secondary | ICD-10-CM

## 2023-01-18 NOTE — Telephone Encounter (Signed)
Requested Prescriptions  Refused Prescriptions Disp Refills   busPIRone (BUSPAR) 5 MG tablet [Pharmacy Med Name: BUSPIRONE HCL 5 MG TABLET] 270 tablet 1    Sig: TAKE 1 TABLET BY MOUTH THREE TIMES A DAY AS NEEDED     Psychiatry: Anxiolytics/Hypnotics - Non-controlled Passed - 01/17/2023 12:31 PM      Passed - Valid encounter within last 12 months    Recent Outpatient Visits           1 month ago Morbid obesity [facility])   [facility] Berniece Salines, FNP   2 months ago Morbid obesity [facility])   [facility] Berniece Salines, FNP   4 months ago Morbid obesity [facility])   [facility] Berniece Salines, FNP   6 months ago Essential hypertension   [facility] Berniece Salines, FNP   10 months ago Essential hypertension   [facility] Berniece Salines, FNP       Future Appointments             In 3 months Zane Herald, Rudolpho Sevin, FNP [facility], [facility]   In 10 months Deirdre Evener, MD [facility]

## 2023-02-21 ENCOUNTER — Other Ambulatory Visit: Payer: Self-pay | Admitting: Nurse Practitioner

## 2023-02-21 DIAGNOSIS — F330 Major depressive disorder, recurrent, mild: Secondary | ICD-10-CM

## 2023-02-21 DIAGNOSIS — F419 Anxiety disorder, unspecified: Secondary | ICD-10-CM

## 2023-02-23 NOTE — Telephone Encounter (Signed)
Requested Prescriptions  Pending Prescriptions Disp Refills   busPIRone (BUSPAR) 5 MG tablet [Pharmacy Med Name: BUSPIRONE HCL 5 MG TABLET] 90 tablet 1    Sig: TAKE 1 TABLET BY MOUTH THREE TIMES A DAY AS NEEDED     Psychiatry: Anxiolytics/Hypnotics - Non-controlled Passed - 02/21/2023  8:54 AM      Passed - Valid encounter within last 12 months    Recent Outpatient Visits           2 months ago Morbid obesity [facility])   [facility] Berniece Salines, FNP   3 months ago Morbid obesity [facility])   [facility] Berniece Salines, FNP   5 months ago Morbid obesity [facility])   [facility] Berniece Salines, FNP   7 months ago Essential hypertension   [facility] Berniece Salines, FNP   11 months ago Essential hypertension   [facility] Berniece Salines, FNP       Future Appointments             In 2 months Zane Herald, Rudolpho Sevin, FNP [facility], [facility]   In 9 months Deirdre Evener, MD [facility]             venlafaxine XR (EFFEXOR-XR) 75 MG 24 hr capsule [Pharmacy Med Name: VENLAFAXINE HCL ER 75 MG CAP] 30 capsule 1    Sig: TAKE 1 CAPSULE BY MOUTH DAILY WITH BREAKFAST.     Psychiatry: Antidepressants - SNRI - desvenlafaxine & venlafaxine Failed - 02/21/2023  8:54 AM      Failed - Lipid Panel in normal range within the last 12 months    Cholesterol  Date Value Ref Range Status  11/09/2022 160 <200 mg/dL Final   LDL Cholesterol (Calc)  Date Value Ref Range Status  11/09/2022 90 mg/dL (calc) Final    Comment:    Reference range: <100 . Desirable range <100 mg/dL for primary prevention;   <70 mg/dL for patients with CHD or diabetic patients  with > or = 2 CHD risk factors. Marland Kitchen LDL-C is now calculated using the Martin-Hopkins  calculation, which is a validated novel method providing  better accuracy than the Friedewald  equation in the  estimation of LDL-C.  Horald Pollen et al. Lenox Ahr. 2130;865(78): 2061-2068  (http://education.[employer].com/faq/FAQ164)    HDL  Date Value Ref Range Status  11/09/2022 51 > OR = 50 mg/dL Final   Triglycerides  Date Value Ref Range Status  11/09/2022 97 <150 mg/dL Final         Passed - Cr in normal range and within 360 days    Creat  Date Value Ref Range Status  11/09/2022 0.74 0.50 - 0.99 mg/dL Final         Passed - Completed PHQ-2 or PHQ-9 in the last 360 days      Passed - Last BP in normal range    BP Readings from Last 1 Encounters:  11/27/22 128/76         Passed - Valid encounter within last 6 months    Recent Outpatient Visits           2 months ago Morbid obesity [facility])    [facility] Berniece Salines, FNP   3 months ago Morbid obesity [facility])   [facility] Della Goo F, FNP   5 months ago Morbid obesity (  [facility])   [facility] Berniece Salines, FNP   7 months ago Essential hypertension   [facility] Berniece Salines, FNP   11 months ago Essential hypertension   [facility] Berniece Salines, FNP       Future Appointments             In 2 months Zane Herald, Rudolpho Sevin, FNP [facility], [facility]   In 9 months Deirdre Evener, MD [facility]

## 2023-03-12 ENCOUNTER — Encounter: Payer: Self-pay | Admitting: Gastroenterology

## 2023-03-18 ENCOUNTER — Encounter: Payer: Self-pay | Admitting: Gastroenterology

## 2023-03-19 ENCOUNTER — Encounter: Payer: Self-pay | Admitting: Gastroenterology

## 2023-03-19 ENCOUNTER — Encounter
Admission: RE | Disposition: A | Discharge status: Home / Self Care | Payer: Self-pay | Source: Home / Self Care | Attending: Gastroenterology

## 2023-03-19 ENCOUNTER — Ambulatory Visit: Payer: BLUE CROSS/BLUE SHIELD | Admitting: Certified Registered"

## 2023-03-19 ENCOUNTER — Ambulatory Visit
Admission: RE | Admit: 2023-03-19 | Discharge: 2023-03-19 | Disposition: A | Discharge status: Home / Self Care | Payer: BLUE CROSS/BLUE SHIELD | Attending: Gastroenterology | Admitting: Gastroenterology

## 2023-03-19 DIAGNOSIS — G43909 Migraine, unspecified, not intractable, without status migrainosus: Secondary | ICD-10-CM | POA: Insufficient documentation

## 2023-03-19 DIAGNOSIS — F419 Anxiety disorder, unspecified: Secondary | ICD-10-CM | POA: Insufficient documentation

## 2023-03-19 DIAGNOSIS — I10 Essential (primary) hypertension: Secondary | ICD-10-CM | POA: Insufficient documentation

## 2023-03-19 DIAGNOSIS — Z6841 Body Mass Index (BMI) 40.0 and over, adult: Secondary | ICD-10-CM | POA: Insufficient documentation

## 2023-03-19 DIAGNOSIS — D124 Benign neoplasm of descending colon: Secondary | ICD-10-CM | POA: Diagnosis not present

## 2023-03-19 DIAGNOSIS — Z1211 Encounter for screening for malignant neoplasm of colon: Secondary | ICD-10-CM

## 2023-03-19 DIAGNOSIS — F32A Depression, unspecified: Secondary | ICD-10-CM | POA: Insufficient documentation

## 2023-03-19 DIAGNOSIS — D126 Benign neoplasm of colon, unspecified: Secondary | ICD-10-CM

## 2023-03-19 DIAGNOSIS — J45909 Unspecified asthma, uncomplicated: Secondary | ICD-10-CM | POA: Insufficient documentation

## 2023-03-19 DIAGNOSIS — Z79899 Other long term (current) drug therapy: Secondary | ICD-10-CM | POA: Insufficient documentation

## 2023-03-19 HISTORY — PX: COLONOSCOPY WITH PROPOFOL: SHX5780

## 2023-03-19 HISTORY — PX: POLYPECTOMY: SHX5525

## 2023-03-19 LAB — POCT PREGNANCY, URINE: Preg Test, Ur: NEGATIVE

## 2023-03-19 SURGERY — COLONOSCOPY WITH PROPOFOL
Anesthesia: General

## 2023-03-19 MED ORDER — PROPOFOL 1000 MG/100ML IV EMUL
INTRAVENOUS | Status: AC
  Filled 2023-03-19: qty 300

## 2023-03-19 MED ORDER — PROPOFOL 500 MG/50ML IV EMUL
INTRAVENOUS | Status: DC | PRN
  Administered 2023-03-19: 150 ug/kg/min via INTRAVENOUS

## 2023-03-19 MED ORDER — PROPOFOL 10 MG/ML IV BOLUS
INTRAVENOUS | Status: DC | PRN
  Administered 2023-03-19 (×2): 20 mg via INTRAVENOUS
  Administered 2023-03-19: 100 mg via INTRAVENOUS

## 2023-03-19 MED ORDER — PROPOFOL 10 MG/ML IV BOLUS
INTRAVENOUS | Status: AC
  Filled 2023-03-19: qty 60

## 2023-03-19 MED ORDER — LIDOCAINE HCL (CARDIAC) PF 100 MG/5ML IV SOSY
PREFILLED_SYRINGE | INTRAVENOUS | Status: DC | PRN
  Administered 2023-03-19: 40 mg via INTRAVENOUS

## 2023-03-19 MED ORDER — SODIUM CHLORIDE 0.9 % IV SOLN
INTRAVENOUS | Status: DC
  Administered 2023-03-19: 1000 mL via INTRAVENOUS

## 2023-03-19 NOTE — Anesthesia Postprocedure Evaluation (Signed)
Anesthesia Post Note  Patient: Shannon Clayton  Procedure(s) Performed: COLONOSCOPY WITH PROPOFOL POLYPECTOMY  Patient location during evaluation: PACU Anesthesia Type: General Level of consciousness: awake and alert Pain management: pain level controlled Vital Signs Assessment: post-procedure vital signs reviewed and stable Respiratory status: spontaneous breathing, nonlabored ventilation, respiratory function stable and patient connected to nasal cannula oxygen Cardiovascular status: blood pressure returned to baseline and stable Postop Assessment: no apparent nausea or vomiting Anesthetic complications: no  No notable events documented.   Last Vitals:  Vitals:   03/19/23 0817 03/19/23 0827  BP: 130/80 (!) 146/99  Pulse: 93 82  Resp: 13 14  Temp:    SpO2: 97% 100%    Last Pain:  Vitals:   03/19/23 0827  PainSc: 0-No pain                 Stephanie Coup

## 2023-03-19 NOTE — H&P (Signed)
Wyline Mood, MD [address], [address], [place], [place], [address] [address], [address], [place], [place], [address] Phone: [phone]  Fax: [phone]  Primary Care Physician:  Berniece Salines, FNP   Pre-Procedure History & Physical: HPI:  Shannon Clayton is a 47 y.o. female is here for an colonoscopy.   Past Medical History:  Diagnosis Date   Depression    Frequent headaches    Hypertension    Migraine headache with aura     Past Surgical History:  Procedure Laterality Date   CESAREAN SECTION  2006   ear pinning  1984   TONSILLECTOMY AND ADENOIDECTOMY  2011    Prior to Admission medications   Medication Sig Start Date End Date Taking? Authorizing Provider  amLODipine (NORVASC) 5 MG tablet TAKE 1 TABLET (5 MG TOTAL) BY MOUTH DAILY. 10/27/22  Yes Berniece Salines, FNP  busPIRone (BUSPAR) 5 MG tablet TAKE 1 TABLET BY MOUTH THREE TIMES A DAY AS NEEDED 02/23/23  Yes Berniece Salines, FNP  co-enzyme Q-10 30 MG capsule Take 30 mg by mouth 3 (three) times daily.   Yes [provider]  ipratropium (ATROVENT) 0.03 % nasal spray Place 2 sprays into both nostrils every 12 (twelve) hours. 10/27/22  Yes Waldon Merl, PA-C  MAGNESIUM PO Take by mouth.   Yes [provider]  meloxicam (MOBIC) 15 MG tablet Take 15 mg by mouth daily. 12/14/22  Yes [provider]  Omega-3 Fatty Acids (FISH OIL OMEGA-3 PO) Take by mouth.   Yes [provider]  venlafaxine XR (EFFEXOR-XR) 75 MG 24 hr capsule TAKE 1 CAPSULE BY MOUTH DAILY WITH BREAKFAST. 02/23/23  Yes Berniece Salines, FNP  butalbital-acetaminophen-caffeine (FIORICET) [phone] MG tablet Take 1-2 tablets by mouth daily as needed for headache. Limit use no more 2x per week 10/03/21   Berniece Salines, FNP  tirzepatide [facility]) 2.5 MG/0.5ML Pen Inject 2.5 mg into the skin once a week. Patient not taking: Reported on 12/22/2022 11/27/22   Berniece Salines, FNP    Allergies as of 12/22/2022 -  Review Complete 11/27/2022  Allergen Reaction Noted   Sumatriptan Other (See Comments) 05/10/2014    Family History  Problem Relation Age of Onset   Cancer Mother        MM   Arthritis Mother    Hyperlipidemia Mother    Hypertension Mother    Anxiety disorder Mother    Arthritis Father    Cancer Maternal Grandmother        lymphoma 2x   Hypertension Maternal Grandmother    Hypertension Maternal Grandfather    Diabetes Maternal Grandfather    Cancer Paternal Grandfather        skin cancer    Breast cancer Neg Hx     Social History   Socioeconomic History   Marital status: Divorced    Spouse name: Not on file   Number of children: Not on file   Years of education: Not on file   Highest education level: Not on file  Occupational History   Not on file  Tobacco Use   Smoking status: Never   Smokeless tobacco: Never  Vaping Use   Vaping status: Never Used  Substance and Sexual Activity   Alcohol use: Yes   Drug use: No   Sexual activity: Yes    Birth control/protection: None  Other Topics Concern   Not on file  Social History Narrative   Single mom  Boyfriend Doug   Has a daughter    Never smoker    Social Determinants of Corporate investment banker Strain: Not on file  Food Insecurity: Not on file  Transportation Needs: Not on file  Physical Activity: Not on file  Stress: Not on file  Social Connections: Not on file  Intimate Partner Violence: Not on file    Review of Systems: See HPI, otherwise negative ROS  Physical Exam: BP (!) 176/119   Pulse (!) 108   Temp (!) 96.5 F (35.8 C)   Resp 16   Ht 5\' 6"  (1.676 m)   Wt (!) 152.3 kg   LMP 03/16/2023 Comment: pregnancy test negative  SpO2 98%   BMI 54.18 kg/m  General:   Alert,  pleasant and cooperative in NAD Head:  Normocephalic and atraumatic. Neck:  Supple; no masses or thyromegaly. Lungs:  Clear throughout to auscultation, normal respiratory effort.    Heart:  +S1, +S2, Regular rate and  rhythm, No edema. Abdomen:  Soft, nontender and nondistended. Normal bowel sounds, without guarding, and without rebound.   Neurologic:  Alert and  oriented x4;  grossly normal neurologically.  Impression/Plan: Shannon Clayton is here for an colonoscopy to be performed for Screening colonoscopy average risk   Risks, benefits, limitations, and alternatives regarding  colonoscopy have been reviewed with the patient.  Questions have been answered.  All parties agreeable.   Wyline Mood, MD  03/19/2023, 7:51 AM

## 2023-03-19 NOTE — Op Note (Signed)
[facility] Gastroenterology Patient Name: Shannon Clayton Procedure Date: 03/19/2023 7:52 AM MRN: [id number] Account #: [id number] Date of Birth: 24-Mar-1976 Admit Type: Outpatient Age: 47 Room: [facility] ENDO ROOM 4 Gender: Female Note Status: Finalized Instrument Name: Prentice Docker [id number] Procedure:             Colonoscopy Indications:           Screening for colorectal malignant neoplasm Providers:             Wyline Mood MD, MD Referring MD:          Rudolpho Sevin. Zane Herald (Referring MD), No Local Md, MD                         (Referring MD) Medicines:             Monitored Anesthesia Care Complications:         No immediate complications. Procedure:             Pre-Anesthesia Assessment:                        - Prior to the procedure, a History and Physical was                         performed, and patient medications, allergies and                         sensitivities were reviewed. The patient's tolerance                         of previous anesthesia was reviewed.                        - The risks and benefits of the procedure and the                         sedation options and risks were discussed with the                         patient. All questions were answered and informed                         consent was obtained.                        - ASA Grade Assessment: II - A patient with mild                         systemic disease.                        After obtaining informed consent, the colonoscope was                         passed under direct vision. Throughout the procedure,                         the patient's blood pressure, pulse, and oxygen                         saturations were  monitored continuously. The                         Colonoscope was introduced through the anus and                         advanced to the the cecum, identified by the                         appendiceal orifice. The colonoscopy was performed                          with ease. The patient tolerated the procedure well.                         The quality of the bowel preparation was excellent.                         The ileocecal valve, appendiceal orifice, and rectum                         were photographed. Findings:      The perianal and digital rectal examinations were normal.      A 5 mm polyp was found in the descending colon. The polyp was sessile.       The polyp was removed with a cold snare. Resection and retrieval were       complete.      The exam was otherwise without abnormality on direct and retroflexion       views. Impression:            - One 5 mm polyp in the descending colon, removed with                         a cold snare. Resected and retrieved.                        - The examination was otherwise normal on direct and                         retroflexion views. Recommendation:        - Discharge patient to home (with escort).                        - Resume previous diet.                        - Continue present medications.                        - Await pathology results.                        - Repeat colonoscopy for surveillance based on                         pathology results. Procedure Code(s):     --- Professional ---                        [id number], Colonoscopy, flexible; with removal of  tumor(s), polyp(s), or other lesion(s) by snare                         technique Diagnosis Code(s):     --- Professional ---                        Z12.11, Encounter for screening for malignant neoplasm                         of colon                        D12.4, Benign neoplasm of descending colon CPT copyright 2022 American Medical Association. All rights reserved. The codes documented in this report are preliminary and upon coder review may  be revised to meet current compliance requirements. Wyline Mood, MD Wyline Mood MD, MD 03/19/2023 8:12:25 AM This report has been signed  electronically. Number of Addenda: 0 Note Initiated On: 03/19/2023 7:52 AM Scope Withdrawal Time: 0 hours 10 minutes 22 seconds  Total Procedure Duration: 0 hours 12 minutes 59 seconds  Estimated Blood Loss:  Estimated blood loss: none.      [facility]

## 2023-03-19 NOTE — Transfer of Care (Signed)
Immediate Anesthesia Transfer of Care Note  Patient: Shannon Clayton  Procedure(s) Performed: COLONOSCOPY WITH PROPOFOL  Patient Location: Endoscopy Unit  Anesthesia Type:MAC  Level of Consciousness: awake, alert , and drowsy  Airway & Oxygen Therapy: Patient Spontanous Breathing and Patient connected to face mask oxygen  Post-op Assessment: Report given to RN, Post -op Vital signs reviewed and stable, and Patient moving all extremities X 4  Post vital signs: Reviewed and stable  Last Vitals:  Vitals Value Taken Time  BP    Temp    Pulse 93 03/19/23 0815  Resp 13 03/19/23 0816  SpO2 97 % 03/19/23 0815  Vitals shown include unfiled device data.  Last Pain:  Vitals:   03/19/23 0706  PainSc: 0-No pain         Complications: No notable events documented.

## 2023-03-19 NOTE — Anesthesia Preprocedure Evaluation (Signed)
Anesthesia Evaluation  Patient identified by MRN, date of birth, ID band Patient awake    Reviewed: Allergy & Precautions, NPO status , Patient's Chart, lab work & pertinent test results  Airway Mallampati: III  TM Distance: >3 FB Neck ROM: full    Dental  (+) Chipped, Dental Advidsory Given   Pulmonary neg pulmonary ROS, asthma    Pulmonary exam normal        Cardiovascular hypertension, On Medications negative cardio ROS Normal cardiovascular exam     Neuro/Psych  PSYCHIATRIC DISORDERS Anxiety Depression    negative neurological ROS  negative psych ROS   GI/Hepatic negative GI ROS, Neg liver ROS,,,  Endo/Other  negative endocrine ROS  Morbid obesity  Renal/GU negative Renal ROS  negative genitourinary   Musculoskeletal   Abdominal   Peds  Hematology negative hematology ROS (+)   Anesthesia Other Findings Past Medical History: No date: Depression No date: Frequent headaches No date: Hypertension No date: Migraine headache with aura  Past Surgical History: 2006: CESAREAN SECTION 1984: ear pinning 2011: TONSILLECTOMY AND ADENOIDECTOMY  BMI    Body Mass Index: 54.18 kg/m      Reproductive/Obstetrics negative OB ROS                             Anesthesia Physical Anesthesia Plan  ASA: 3  Anesthesia Plan: General   Post-op Pain Management: Minimal or no pain anticipated   Induction: Intravenous  PONV Risk Score and Plan: 3 and Propofol infusion, TIVA and Ondansetron  Airway Management Planned: Nasal Cannula  Additional Equipment: None  Intra-op Plan:   Post-operative Plan:   Informed Consent: I have reviewed the patients History and Physical, chart, labs and discussed the procedure including the risks, benefits and alternatives for the proposed anesthesia with the patient or authorized representative who has indicated his/her understanding and acceptance.      Dental advisory given  Plan Discussed with: CRNA and Surgeon  Anesthesia Plan Comments: (Discussed risks of anesthesia with patient, including possibility of difficulty with spontaneous ventilation under anesthesia necessitating airway intervention, PONV, and rare risks such as cardiac or respiratory or neurological events, and allergic reactions. Discussed the role of CRNA in patient's perioperative care. Patient understands.)       Anesthesia Quick Evaluation

## 2023-03-22 ENCOUNTER — Encounter: Payer: Self-pay | Admitting: Gastroenterology

## 2023-03-22 LAB — SURGICAL PATHOLOGY

## 2023-03-23 ENCOUNTER — Encounter: Payer: Self-pay | Admitting: Gastroenterology

## 2023-03-27 ENCOUNTER — Other Ambulatory Visit: Payer: Self-pay | Admitting: Nurse Practitioner

## 2023-03-27 ENCOUNTER — Telehealth: Payer: BLUE CROSS/BLUE SHIELD | Admitting: Nurse Practitioner

## 2023-03-27 DIAGNOSIS — B379 Candidiasis, unspecified: Secondary | ICD-10-CM

## 2023-03-27 DIAGNOSIS — R399 Unspecified symptoms and signs involving the genitourinary system: Secondary | ICD-10-CM

## 2023-03-27 MED ORDER — FLUCONAZOLE 150 MG PO TABS
150.0000 mg | ORAL_TABLET | Freq: Once | ORAL | 0 refills | Status: AC
Start: 2023-03-27 — End: 2023-03-27

## 2023-03-27 MED ORDER — NITROFURANTOIN MONOHYD MACRO 100 MG PO CAPS
100.0000 mg | ORAL_CAPSULE | Freq: Two times a day (BID) | ORAL | 0 refills | Status: AC
Start: 2023-03-27 — End: 2023-04-01

## 2023-03-27 NOTE — Progress Notes (Signed)
I have spent 5 minutes in review of e-visit questionnaire, review and updating patient chart, medical decision making and response to patient.  ° °Jerrell Mangel W Secilia Apps, NP ° °  °

## 2023-03-27 NOTE — Progress Notes (Signed)

## 2023-04-22 ENCOUNTER — Other Ambulatory Visit: Payer: Self-pay | Admitting: Nurse Practitioner

## 2023-04-22 DIAGNOSIS — F419 Anxiety disorder, unspecified: Secondary | ICD-10-CM

## 2023-04-22 DIAGNOSIS — F330 Major depressive disorder, recurrent, mild: Secondary | ICD-10-CM

## 2023-04-22 DIAGNOSIS — I10 Essential (primary) hypertension: Secondary | ICD-10-CM

## 2023-04-22 NOTE — Telephone Encounter (Signed)
Requested Prescriptions  Pending Prescriptions Disp Refills   amLODipine (NORVASC) 5 MG tablet [Pharmacy Med Name: AMLODIPINE BESYLATE 5 MG TAB] 90 tablet 0    Sig: TAKE 1 TABLET (5 MG TOTAL) BY MOUTH DAILY.     Cardiovascular: Calcium Channel Blockers 2 Failed - 04/22/2023  1:39 AM      Failed - Last BP in normal range    BP Readings from Last 1 Encounters:  03/19/23 (!) 146/99         Passed - Last Heart Rate in normal range    Pulse Readings from Last 1 Encounters:  03/19/23 82         Passed - Valid encounter within last 6 months    Recent Outpatient Visits           4 months ago Morbid obesity [facility])   [facility] Berniece Salines, FNP   5 months ago Morbid obesity [facility])   [place] [facility] Berniece Salines, FNP   7 months ago Morbid obesity [facility])   [facility] Berniece Salines, FNP   9 months ago Essential hypertension   [facility]ce Salines, FNP   1 year ago Essential hypertension   [facility] Berniece Salines, FNP       Future Appointments             In 2 weeks Zane Herald, Rudolpho Sevin, FNP [facility]   In 7 months Deirdre Evener, MD [facility]             busPIRone (BUSPAR) 5 MG tablet [Pharmacy Med Name: BUSPIRONE HCL 5 MG TABLET] 270 tablet 0    Sig: TAKE 1 TABLET BY MOUTH THREE TIMES A DAY AS NEEDED     Psychiatry: Anxiolytics/Hypnotics - Non-controlled Passed - 04/22/2023  1:39 AM      Passed - Valid encounter within last 12 months    Recent Outpatient Visits           4 months ago Morbid obesity [facility])   [place] [facility] Berniece Salines, FNP   5 months ago Morbid obesity [facility])   [place] [facility] Salines, FNP   7 months ago [facility]bid [facility])   [facility] Berniece Salines, FNP   9 months  ago Essential hypertension   [facility] Berniece Salines, FNP   1 year ago Essential hypertension   [facility] Berniece Salines, FNP       Future Appointments             In 2 weeks Zane Herald Rudolpho Sevin, FNP [facility]   In 7 months Deirdre Evener, MD  [facility]             venlafaxine XR (EFFEXOR-XR) 75 MG 24 hr capsule [Pharmacy Med Name: VENLAFAXINE HCL ER 75 MG CAP] 90 capsule 0    Sig: TAKE 1 CAPSULE BY MOUTH DAILY WITH BREAKFAST.     Psychiatry: Antidepressants - SNRI - desvenlafaxine & venlafaxine Failed - 04/22/2023  1:39 AM      Failed - Last BP in normal range    BP Readings from Last 1 Encounters:  03/19/23 (!) 146/99         Failed - Lipid Panel  in normal range within the last 12 months    Cholesterol  Date Value Ref Range Status  11/09/2022 160 <200 mg/dL Final   LDL Cholesterol (Calc)  Date Value Ref Range Status  11/09/2022 90 mg/dL (calc) Final    Comment:    Reference range: <100 . Desirable range <100 mg/dL for primary prevention;   <70 mg/dL for patients with CHD or diabetic patients  with > or = 2 CHD risk factors. Marland Kitchen LDL-C is now calculated using the Martin-Hopkins  calculation, which is a validated novel method providing  better accuracy than the Friedewald equation in the  estimation of LDL-C.  Horald Pollen et al. Lenox Ahr. 1610;960(45): 2061-2068  (http://education.Quest[employer].com/faq/FAQ164)    HDL  Date Value Ref Range Status  11/09/2022 51 > OR = 50 mg/dL Final   Triglycerides  Date Value Ref Range Status  11/09/2022 97 <150 mg/dL Final         Passed - Cr in normal range and within 360 days    Creat  Date Value Ref Range Status  11/09/2022 0.74 0.50 - 0.99 mg/dL Final         Passed - Completed PHQ-2 or PHQ-9 in the last 360 days      Passed - Valid encounter within last 6 months    Recent Outpatient Visits            4 months ago [facility]bid [facility])   [facility] Berniece Salines, FNP   5 months ago [facility]bid [facility])   [facility] Berniece Salines, FNP   7 months ago [facility]bid [facility])   [facility] Berniece Salines, FNP   9 months ago Essential hypertension   [facility], F[facility]   1 year ago Essential hypertension   [facility] Berniece Salines, FNP       Future Appointments             In 2 weeks Zane Herald, Rudolpho Sevin, FNP [facility]   In 7 months Deirdre Evener, MD [facility]

## 2023-05-12 ENCOUNTER — Ambulatory Visit: Payer: BLUE CROSS/BLUE SHIELD | Admitting: Nurse Practitioner

## 2023-05-13 ENCOUNTER — Ambulatory Visit: Payer: BLUE CROSS/BLUE SHIELD | Admitting: Nurse Practitioner

## 2023-05-13 ENCOUNTER — Encounter: Payer: Self-pay | Admitting: Nurse Practitioner

## 2023-05-13 VITALS — BP 162/98 | HR 108 | Temp 98.8°F | Resp 18 | Ht 72.0 in | Wt 334.4 lb

## 2023-05-13 DIAGNOSIS — E785 Hyperlipidemia, unspecified: Secondary | ICD-10-CM

## 2023-05-13 DIAGNOSIS — R7303 Prediabetes: Secondary | ICD-10-CM

## 2023-05-13 DIAGNOSIS — I10 Essential (primary) hypertension: Secondary | ICD-10-CM | POA: Diagnosis not present

## 2023-05-13 DIAGNOSIS — E559 Vitamin D deficiency, unspecified: Secondary | ICD-10-CM

## 2023-05-13 DIAGNOSIS — F419 Anxiety disorder, unspecified: Secondary | ICD-10-CM

## 2023-05-13 DIAGNOSIS — F330 Major depressive disorder, recurrent, mild: Secondary | ICD-10-CM

## 2023-05-13 DIAGNOSIS — G43109 Migraine with aura, not intractable, without status migrainosus: Secondary | ICD-10-CM

## 2023-05-13 MED ORDER — LOSARTAN POTASSIUM 25 MG PO TABS
25.0000 mg | ORAL_TABLET | Freq: Every day | ORAL | 0 refills | Status: DC
Start: 2023-05-13 — End: 2023-08-04

## 2023-05-13 NOTE — Assessment & Plan Note (Signed)
Vitamin D level within normal range

## 2023-05-13 NOTE — Assessment & Plan Note (Signed)
Doing well on Effexor 75 mg daily and BuSpar as needed.

## 2023-05-13 NOTE — Progress Notes (Signed)
BP (!) 162/98   Pulse (!) 108   Temp 98.8 F (37.1 C)   Resp 18   Ht 6' (1.829 m)   Wt (!) 334 lb 6.4 oz (151.7 kg)   LMP 03/18/2023   SpO2 97%   BMI 45.35 kg/m    Subjective:    Patient ID: Shannon Clayton, female    DOB: 15-Apr-1976, 47 y.o.   MRN: [id number]  HPI: Shannon Clayton is a 47 y.o. female, here alone  Chief Complaint  Patient presents with   Follow-up    Migraines: patient reports she does get auras with her migraines.  She uses Fioricet 50-325-40 mg as needed for migraines. She says she had one last week but prior to that it had been a long time.    Hypertension:  -Medications: amlodipine 5 mg daily -Patient is compliant with above medications and reports no side effects. -Checking BP at home (average): 160s -Denies any SOB, CP, vision changes, LE edema or symptoms of hypotension -Diet: recommend DASH diet  -Exercise: recommend 150 min of physical activity weekly    - will add on losartan 25 mg daily, patient will keep blood pressure log and report readings    05/13/2023    3:06 PM 05/13/2023    2:45 PM 03/19/2023    8:27 AM  Vitals with BMI  Height  6\' 0"    Weight  334 lbs 6 oz   BMI  45.34   Systolic 162 172 102  Diastolic 98 80 99  Pulse  108 82     Obesity:  Current weight : 334 lbs BMI: 45.35 previous weight:334 lbs Treatment Tried: wegovy, did not tolerate.   Comorbidities: HLD, HTN, prediabetes  [facility]   Vitamin D deficiency: patient continues to take vitamin d supplement.  Last vitamin d was in normal range.   HLD:  -Medications: none -Patient is working on lifestyle modification -Last lipid panel:  Lipid Panel     Component Value Date/Time   CHOL 160 11/09/2022 1558   TRIG 97 11/09/2022 1558   HDL 51 11/09/2022 1558   CHOLHDL 3.1 11/09/2022 1558   VLDL 21.6 06/09/2019 0845   LDLCALC 90 11/09/2022 1558    The 10-year ASCVD risk score (Arnett DK, et al., 2019) is: 1.4%    Values used to calculate the score:     Age: 38 years     Sex: Female     Is Non-Hispanic African American: No     Diabetic: No     Tobacco smoker: No     Systolic Blood Pressure: 162 mmHg     Is BP treated: Yes     HDL Cholesterol: 51 mg/dL     Total Cholesterol: 160 mg/dL   prediabetes: last V2Z was 5.5, she continues to work on lifestyle modification  Depression and anxiety: she currently takes effexor 75 mg daily and buspar as needed.  Her PHQ9 and GAD scores are negative.     05/13/2023    2:49 PM 11/27/2022    3:39 PM 11/09/2022    3:38 PM 11/09/2022    3:36 PM 09/18/2022    8:08 AM  Depression screen PHQ 2/9  Decreased Interest 0 0 0 0 0  Down, Depressed, Hopeless 0 0 0 0 0  PHQ - 2 Score 0 0 0 0 0  Altered sleeping 0  0  0  Tired, decreased energy 0  0  0  Change in appetite  0  0  0  Feeling bad or failure about yourself  0  0  0  Trouble concentrating 0  0  0  Moving slowly or fidgety/restless 0  0  0  Suicidal thoughts 0  0  0  PHQ-9 Score 0  0  0  Difficult doing work/chores Not difficult at all  Not difficult at all  Not difficult at all       11/09/2022    3:38 PM 09/18/2022    8:08 AM 03/06/2022    3:12 PM 10/03/2021    3:02 PM  GAD 7 : Generalized Anxiety Score  Nervous, Anxious, on Edge 1 0 2 3  Control/stop worrying 1 0 2 3  Worry too much - different things 0 0 2 2  Trouble relaxing 1 0 1 0  Restless 0 0 1 0  Easily annoyed or irritable 0 0 0 1  Afraid - awful might happen 0 0 0 1  Total GAD 7 Score 3 0 8 10  Anxiety Difficulty  Not difficult at all Not difficult at all Not difficult at all     Relevant past medical, surgical, family and social history reviewed and updated as indicated. Interim medical history since our last visit reviewed. Allergies and medications reviewed and updated.  Review of Systems  Constitutional: Negative for fever or weight change.  Respiratory: Negative for cough and shortness of breath.   Cardiovascular: Negative for  chest pain or palpitations.  Gastrointestinal: positive for abdominal pain, diarrhea Musculoskeletal: Negative for gait problem or joint swelling.  Skin: Negative for rash.  Neurological: Negative for dizziness or headache.  No other specific complaints in a complete review of systems (except as listed in HPI above).      Objective:    BP (!) 162/98   Pulse (!) 108   Temp 98.8 F (37.1 C)   Resp 18   Ht 6' (1.829 m)   Wt (!) 334 lb 6.4 oz (151.7 kg)   LMP 03/18/2023   SpO2 97%   BMI 45.35 kg/m   Wt Readings from Last 3 Encounters:  05/13/23 (!) 334 lb 6.4 oz (151.7 kg)  03/19/23 (!) 335 lb 11.1 oz (152.3 kg)  11/27/22 (!) 335 lb 8 oz (152.2 kg)    Physical Exam  Constitutional: Patient appears well-developed and well-nourished. Obese  No distress.  HEENT: head atraumatic, normocephalic, pupils equal and reactive to light,  neck supple Cardiovascular: Normal rate, regular rhythm and normal heart sounds.  No murmur heard. No BLE edema. Pulmonary/Chest: Effort normal and breath sounds normal. No respiratory distress. Abdominal: Soft.  There is no tenderness. Psychiatric: Patient has a normal mood and affect. behavior is normal. Judgment and thought content normal.   Results for orders placed or performed during the hospital encounter of 03/19/23  Pregnancy, urine POC  Result Value Ref Range   Preg Test, Ur NEGATIVE NEGATIVE  Surgical pathology  Result Value Ref Range   SURGICAL PATHOLOGY      SURGICAL PATHOLOGY [facility] [address] [place], [place] [address] Telephone [phone] or [phone] Fax [phone]  REPORT OF SURGICAL PATHOLOGY   Accession #: [id number] Patient Name: Shannon Clayton Visit # : [id number]  MRN: [id number] Physician: Wyline Mood DOB/Age 07-11-1975 (Age: 89) Gender: F Collected Date: 03/19/2023 Received Date: 03/19/2023  FINAL DIAGNOSIS       1. Descending Colon Polyp, Cold snare :       -  TUBULAR ADENOMA      -  NEGATIVE FOR HIGH-GRADE DYSPLASIA OR MALIGNANCY       DATE SIGNED OUT: 03/22/2023 ELECTRONIC SIGNATURE : Corey Harold M.D., Dossie Arbour., Pathologist, Electronic Signature  MICROSCOPIC DESCRIPTION  CASE COMMENTS STAINS USED IN DIAGNOSIS: H&E    CLINICAL HISTORY  SPECIMEN(S) OBTAINED 1. Descending Colon Polyp, Cold Snare  SPECIMEN COMMENTS: SPECIMEN CLINICAL INFORMATION: 1. Screening colonoscopy    Gross Description 1. Received in formalin, labeled "cold snare  descending colon polyp", is a 0.6 x 0.5 x 0.2 cm soft, tan tissue fragment submitted entirely in block 1A.      SMB      03/19/2023        Report signed out from the following location(s) [place] [address], [place] [address] CLIA #: [id number]  [facility] [address] [place], [place] [address] CLIA #: [id number]       Assessment & Plan:   Problem List Items Addressed This Visit       Cardiovascular and Mediastinum   Migraine with aura and without status migrainosus, not intractable - Primary     She uses Fioricet 50-325-40 mg as needed for migraines. She says she had one last week but prior to that it had been a long time.        Relevant Medications   losartan (COZAAR) 25 MG tablet   Essential hypertension    Continue amlodipine 5 mg daily, add on losartan 25 mg daily.  Decrease sodium in diet.  Increase physical activity.  Keep blood pressure log and report readings.      Relevant Medications   losartan (COZAAR) 25 MG tablet     Other   Morbid obesity (HCC)    Patient working on lifestyle modification.  [facility]      Relevant Orders   Amb Ref to Medical Weight Management   Prediabetes    Will recheck at next office visit.  Patient is working on lifestyle modification.      Vitamin D deficiency    Vitamin D level within normal range.      Anxiety    Doing well on  Effexor 75 mg daily and BuSpar as needed.      Mild episode of recurrent major depressive disorder (HCC)    Well on Effexor 75 mg daily and BuSpar as needed.      Hyperlipidemia    Continues to work on lifestyle modification.      Relevant Medications   losartan (COZAAR) 25 MG tablet      Follow up plan: Return in about 3 months (around 08/13/2023) for follow up.

## 2023-05-13 NOTE — Assessment & Plan Note (Signed)
Continue amlodipine 5 mg daily, add on losartan 25 mg daily.  Decrease sodium in diet.  Increase physical activity.  Keep blood pressure log and report readings.

## 2023-05-13 NOTE — Assessment & Plan Note (Signed)
Will recheck at next office visit.  Patient is working on lifestyle modification.

## 2023-05-13 NOTE — Assessment & Plan Note (Signed)
Continues to work on lifestyle modification. 

## 2023-05-13 NOTE — Assessment & Plan Note (Signed)
She uses Fioricet 50-325-40 mg as needed for migraines. She says she had one last week but prior to that it had been a long time.

## 2023-05-13 NOTE — Assessment & Plan Note (Signed)
Well on Effexor 75 mg daily and BuSpar as needed.

## 2023-05-13 NOTE — Assessment & Plan Note (Signed)
Patient working on lifestyle modification.  Referral [facility] to [facility]

## 2023-05-24 ENCOUNTER — Encounter: Payer: Self-pay | Admitting: Nurse Practitioner

## 2023-05-25 ENCOUNTER — Other Ambulatory Visit: Payer: Self-pay | Admitting: Nurse Practitioner

## 2023-05-25 DIAGNOSIS — F419 Anxiety disorder, unspecified: Secondary | ICD-10-CM

## 2023-05-25 DIAGNOSIS — F330 Major depressive disorder, recurrent, mild: Secondary | ICD-10-CM

## 2023-05-25 MED ORDER — VENLAFAXINE HCL ER 37.5 MG PO CP24
37.5000 mg | ORAL_CAPSULE | Freq: Every day | ORAL | 0 refills | Status: DC
Start: 2023-05-25 — End: 2023-08-13

## 2023-05-25 MED ORDER — ESCITALOPRAM OXALATE 10 MG PO TABS
10.0000 mg | ORAL_TABLET | Freq: Every day | ORAL | 0 refills | Status: DC
Start: 2023-05-25 — End: 2023-06-17

## 2023-06-16 ENCOUNTER — Other Ambulatory Visit: Payer: Self-pay | Admitting: Nurse Practitioner

## 2023-06-16 DIAGNOSIS — F419 Anxiety disorder, unspecified: Secondary | ICD-10-CM

## 2023-06-16 DIAGNOSIS — F330 Major depressive disorder, recurrent, mild: Secondary | ICD-10-CM

## 2023-06-17 NOTE — Telephone Encounter (Signed)
Requested medication (s) are due for refill today:   Yes  Requested medication (s) are on the active medication list:   Yes  Future visit scheduled:   Yes 08/13/2023 with Raynelle Fanning   Last ordered: 05/25/2023 #30, 0 refills  Returned because only #30 ordered with 0 refills.    Phar. Requesting 90 day supply and a DX Code.   Requested Prescriptions  Pending Prescriptions Disp Refills   escitalopram (LEXAPRO) 10 MG tablet [Pharmacy Med Name: ESCITALOPRAM 10 MG TABLET] 90 tablet 1    Sig: TAKE 1 TABLET BY MOUTH EVERY DAY     Psychiatry:  Antidepressants - SSRI Passed - 06/17/2023  8:25 AM      Passed - Completed PHQ-2 or PHQ-9 in the last 360 days      Passed - Valid encounter within last 6 months    Recent Outpatient Visits           1 month ago Migraine with aura and without status migrainosus, not intractable   [facility] Berniece Salines, FNP   6 months ago Morbid obesity [facility])   [facility] Berniece Salines, FNP   7 months ago Morbid obesity [facility])   [facility] Berniece Salines, FNP   9 months ago Morbid obesity [facility])   [facility] Berniece Salines, FNP   11 months ago Essential hypertension   [facility] Berniece Salines, FNP       Future Appointments             In 1 month Zane Herald, Rudolpho Sevin, FNP [facility]   In 5 months Deirdre Evener, MD [facility]

## 2023-07-16 ENCOUNTER — Other Ambulatory Visit: Payer: Self-pay | Admitting: Nurse Practitioner

## 2023-07-16 DIAGNOSIS — F419 Anxiety disorder, unspecified: Secondary | ICD-10-CM

## 2023-07-16 DIAGNOSIS — F330 Major depressive disorder, recurrent, mild: Secondary | ICD-10-CM

## 2023-07-19 NOTE — Telephone Encounter (Signed)
 Requested medications are due for refill today.  unsure  Requested medications are on the active medications list.  yes  Last refill. 05/25/2023 #7  Future visit scheduled.   yes  Notes to clinic.  Pt is weaning off this medication.    Requested Prescriptions  Pending Prescriptions Disp Refills   venlafaxine  XR (EFFEXOR -XR) 75 MG 24 hr capsule [Pharmacy Med Name: VENLAFAXINE  HCL ER 75 MG CAP] 30 capsule 2    Sig: TAKE 1 CAPSULE BY MOUTH DAILY WITH BREAKFAST.     Psychiatry: Antidepressants - SNRI - desvenlafaxine & venlafaxine  Failed - 07/19/2023  1:13 PM      Failed - Last BP in normal range    BP Readings from Last 1 Encounters:  05/13/23 (!) 162/98         Failed - Lipid Panel in normal range within the last 12 months    Cholesterol  Date Value Ref Range Status  11/09/2022 160 <200 mg/dL Final   LDL Cholesterol (Calc)  Date Value Ref Range Status  11/09/2022 90 mg/dL (calc) Final    Comment:    Reference range: <100 . Desirable range <100 mg/dL for primary prevention;   <70 mg/dL for patients with CHD or diabetic patients  with > or = 2 CHD risk factors. SABRA LDL-C is now calculated using the Martin-Hopkins  calculation, which is a validated novel method providing  better accuracy than the Friedewald equation in the  estimation of LDL-C.  Gladis APPLETHWAITE et al. SANDREA. 7986;689(80): 2061-2068  (http://education.QuestDiagn[employer].com/faq/FAQ164)    HDL  Date Value Ref Range Status  11/09/2022 51 > OR = 50 mg/dL Final   Triglycerides  Date Value Ref Range Status  11/09/2022 97 <150 mg/dL Final         Passed - Cr in normal range and within 360 days    Creat  Date Value Ref Range Status  11/09/2022 0.74 0.50 - 0.99 mg/dL Final         Passed - Completed PHQ-2 or PHQ-9 in the last 360 days      Passed - Valid encounter within last 6 months    Recent Outpatient Visits           2 months ago Migraine with aura and without status migrainosus, not intractable    [facility] Gareth Mliss FALCON, FNP   7 months ago Morbid [facility])   [facility] Gareth Mliss FALCON, FNP   8 months ago Morbid [facility])   [facility] Gareth Mliss FALCON, FNP   10 months ago Morbid obesity [facility])   [facility] Gareth Mliss FALCON, FNP   1 year ago Essential hypertension   [facility] Gareth Mliss FALCON, FNP       Future Appointments             In 3 weeks Gareth, Mliss FALCON, FNP [facility]   In 4 months Hester Alm BROCKS, MD [facility]

## 2023-07-20 ENCOUNTER — Other Ambulatory Visit: Payer: Self-pay | Admitting: Nurse Practitioner

## 2023-07-20 DIAGNOSIS — I10 Essential (primary) hypertension: Secondary | ICD-10-CM

## 2023-07-20 DIAGNOSIS — F419 Anxiety disorder, unspecified: Secondary | ICD-10-CM

## 2023-07-21 NOTE — Telephone Encounter (Signed)
 Requested Prescriptions  Pending Prescriptions Disp Refills   busPIRone  (BUSPAR ) 5 MG tablet [Pharmacy Med Name: BUSPIRONE  HCL 5 MG TABLET] 90 tablet 2    Sig: TAKE 1 TABLET BY MOUTH THREE TIMES A DAY AS NEEDED     Psychiatry: Anxiolytics/Hypnotics - Non-controlled Passed - 07/21/2023  8:19 AM      Passed - Valid encounter within last 12 months    Recent Outpatient Visits           2 months ago Migraine with aura and without status migrainosus, not intractable   [facility] Quinton Buckler, FNP   7 months ago Morbid obesity [facility])   [facility] Quinton Buckler, FNP   8 months ago Morbid obesity [facility])   [facility] Quinton Buckler, FNP   10 months ago Morbid obesity [facility])   [facility] Quinton Buckler, FNP   1 year ago Essential hypertension   [facility] Quinton Buckler, FNP       Future Appointments             In 3 weeks Abram Hoguet Monalisa Angles, FNP [facility]   In 4 months Elta Halter, MD [facility]             amLODipine  (NORVASC ) 5 MG tablet [Pharmacy Med Name: AMLODIPINE  BESYLATE 5 MG TAB] 30 tablet 1    Sig: TAKE 1 TABLET (5 MG TOTAL) BY MOUTH DAILY.     Cardiovascular: Calcium Channel Blockers 2 Failed - 07/21/2023  8:19 AM      Failed - Last BP in normal range    BP Readings from Last 1 Encounters:  05/13/23 (!) 162/98         Passed - Last Heart Rate in normal range    Pulse Readings from Last 1 Encounters:  05/13/23 (!) 108         Passed - Valid encounter within last 6 months    Recent Outpatient Visits           2 months ago Migraine with aura and without status migrainosus, not intractable   [facility] Quinton Buckler, FNP   7 months ago Morbid obesity [facility])   [facility] Quinton Buckler, FNP   8 months ago  Morbid [facility])   [facility] Quinton Buckler, FNP   10 months ago Morbid obesity [facility])   [facility] Quinton Buckler, FNP   1 year ago Essential hypertension   [facility] Quinton Buckler, FNP       Future Appointments             In 3 weeks Abram Hoguet, Monalisa Angles, FNP [facility]   In 4 months Elta Halter, MD [facility]

## 2023-08-03 ENCOUNTER — Encounter: Payer: Self-pay | Admitting: Nurse Practitioner

## 2023-08-04 ENCOUNTER — Other Ambulatory Visit: Payer: Self-pay | Admitting: Nurse Practitioner

## 2023-08-04 DIAGNOSIS — I10 Essential (primary) hypertension: Secondary | ICD-10-CM

## 2023-08-04 NOTE — Telephone Encounter (Signed)
Requested Prescriptions  Pending Prescriptions Disp Refills   losartan (COZAAR) 25 MG tablet [Pharmacy Med Name: LOSARTAN POTASSIUM 25 MG TAB] 90 tablet 0    Sig: TAKE 1 TABLET (25 MG TOTAL) BY MOUTH DAILY.     Cardiovascular:  Angiotensin Receptor Blockers Failed - 08/04/2023  9:43 AM      Failed - Cr in normal range and within 180 days    Creat  Date Value Ref Range Status  11/09/2022 0.74 0.50 - 0.99 mg/dL Final         Failed - K in normal range and within 180 days    Potassium  Date Value Ref Range Status  11/09/2022 4.1 3.5 - 5.3 mmol/L Final         Failed - Last BP in normal range    BP Readings from Last 1 Encounters:  05/13/23 (!) 162/98         Passed - Patient is not pregnant      Passed - Valid encounter within last 6 months    Recent Outpatient Visits           2 months ago Migraine with aura and without status migrainosus, not intractable   [facility] Berniece Salines, FNP   8 months ago Morbid [facility])   [facility] Berniece Salines, FNP   8 months ago Morbid obesity [facility])   [facility] Berniece Salines, FNP   10 months ago Morbid obesity [facility])   [facility] Berniece Salines, FNP   1 year ago Essential hypertension   [facility] Berniece Salines, FNP       Future Appointments             In 1 week Zane Herald, Rudolpho Sevin, FNP [facility], [facility]   In 3 months Deirdre Evener, MD [facility]

## 2023-08-13 ENCOUNTER — Encounter: Payer: Self-pay | Admitting: Nurse Practitioner

## 2023-08-13 ENCOUNTER — Ambulatory Visit: Payer: BLUE CROSS/BLUE SHIELD | Admitting: Nurse Practitioner

## 2023-08-13 VITALS — BP 140/88 | HR 107 | Temp 97.6°F | Resp 18 | Ht 72.0 in | Wt 343.0 lb

## 2023-08-13 DIAGNOSIS — Z6841 Body Mass Index (BMI) 40.0 and over, adult: Secondary | ICD-10-CM

## 2023-08-13 DIAGNOSIS — I10 Essential (primary) hypertension: Secondary | ICD-10-CM

## 2023-08-13 DIAGNOSIS — F330 Major depressive disorder, recurrent, mild: Secondary | ICD-10-CM

## 2023-08-13 DIAGNOSIS — F419 Anxiety disorder, unspecified: Secondary | ICD-10-CM

## 2023-08-13 DIAGNOSIS — G43109 Migraine with aura, not intractable, without status migrainosus: Secondary | ICD-10-CM

## 2023-08-13 DIAGNOSIS — L658 Other specified nonscarring hair loss: Secondary | ICD-10-CM

## 2023-08-13 DIAGNOSIS — Z79899 Other long term (current) drug therapy: Secondary | ICD-10-CM

## 2023-08-13 DIAGNOSIS — E785 Hyperlipidemia, unspecified: Secondary | ICD-10-CM

## 2023-08-13 DIAGNOSIS — R7303 Prediabetes: Secondary | ICD-10-CM

## 2023-08-13 MED ORDER — MINOXIDIL 2.5 MG PO TABS
2.5000 mg | ORAL_TABLET | Freq: Every day | ORAL | 0 refills | Status: DC
Start: 2023-08-13 — End: 2023-09-07

## 2023-08-13 MED ORDER — ESCITALOPRAM OXALATE 20 MG PO TABS
20.0000 mg | ORAL_TABLET | Freq: Every day | ORAL | 0 refills | Status: DC
Start: 2023-08-13 — End: 2023-09-06

## 2023-08-13 NOTE — Progress Notes (Signed)
 BP (!) 140/88   Pulse (!) 107   Temp 97.6 F (36.4 C)   Resp 18   Ht 6' (1.829 m)   Wt (!) 343 lb (155.6 kg)   SpO2 96%   BMI 46.52 kg/m    Subjective:    Patient ID: Shannon Clayton, female    DOB: 06/03/1976, 48 y.o.   MRN: [id number]  HPI: Shannon Clayton is a 48 y.o. female  Chief Complaint  Patient presents with   Medical Management of Chronic Issues    Discussed the use of AI scribe software for clinical note transcription with the patient, who gave verbal consent to proceed.  History of Present Illness   The patient, with a history of migraines, hypertension, obesity, high cholesterol, prediabetes, depression, and anxiety, presents with a chief complaint of hair loss. The patient is currently on losartan  25mg  daily, Lexapro  10mg  daily, Fioricet as needed for migraines, amlodipine  5mg  daily, and buspirone  5mg  as needed three times a day. The patient reports increased anxiety and requests an increase in her Lexapro  dosage. The patient also mentions having a couple more migraines recently but states that the Fioricet is still effective when she has a migraine.       05/13/2023    2:49 PM 11/27/2022    3:39 PM 11/09/2022    3:38 PM  Depression screen PHQ 2/9  Decreased Interest 0 0 0  Down, Depressed, Hopeless 0 0 0  PHQ - 2 Score 0 0 0  Altered sleeping 0  0  Tired, decreased energy 0  0  Change in appetite 0  0  Feeling bad or failure about yourself  0  0  Trouble concentrating 0  0  Moving slowly or fidgety/restless 0  0  Suicidal thoughts 0  0  PHQ-9 Score 0  0  Difficult doing work/chores Not difficult at all  Not difficult at all    Relevant past medical, surgical, family and social history reviewed and updated as indicated. Interim medical history since our last visit reviewed. Allergies and medications reviewed and updated.  Review of Systems  Constitutional: Negative for fever or weight change.  Respiratory: Negative for cough and shortness of  breath.   Cardiovascular: Negative for chest pain or palpitations.  Gastrointestinal: Negative for abdominal pain, no bowel changes.  Musculoskeletal: Negative for gait problem or joint swelling.  Skin: Negative for rash.  Neurological: Negative for dizziness or headache.  No other specific complaints in a complete review of systems (except as listed in HPI above).      Objective:    BP (!) 140/88   Pulse (!) 107   Temp 97.6 F (36.4 C)   Resp 18   Ht 6' (1.829 m)   Wt (!) 343 lb (155.6 kg)   SpO2 96%   BMI 46.52 kg/m   BP Readings from Last 3 Encounters:  08/13/23 (!) 140/88  05/13/23 (!) 162/98  03/19/23 (!) 146/99     Wt Readings from Last 3 Encounters:  08/13/23 (!) 343 lb (155.6 kg)  05/13/23 (!) 334 lb 6.4 oz (151.7 kg)  03/19/23 (!) 335 lb 11.1 oz (152.3 kg)    Physical Exam Vitals reviewed.  Constitutional:      Appearance: Normal appearance.  HENT:     Head: Normocephalic.  Cardiovascular:     Rate and Rhythm: Normal rate and regular rhythm.  Pulmonary:     Effort: Pulmonary effort is normal.     Breath sounds: Normal breath  sounds.  Musculoskeletal:        General: Normal range of motion.  Skin:    General: Skin is warm and dry.  Neurological:     General: No focal deficit present.     Mental Status: She is alert and oriented to person, place, and time. Mental status is at baseline.  Psychiatric:        Mood and Affect: Mood normal.        Behavior: Behavior normal.        Thought Content: Thought content normal.        Judgment: Judgment normal.     Results for orders placed or performed during the hospital encounter of 03/19/23  Surgical pathology   Collection Time: 03/19/23 12:00 AM  Result Value Ref Range   SURGICAL PATHOLOGY      SURGICAL PATHOLOGY [facility] [address] [place], [place] [address] Telephone [phone] or [phone] Fax [phone]  REPORT OF SURGICAL PATHOLOGY   Accession #:  [id number] Patient Name: Shannon Clayton Visit # : [id number]  MRN: [id number] Physician: Therisa Bi DOB/Age December 17, 1975 (Age: 70) Gender: F Collected Date: 03/19/2023 Received Date: 03/19/2023  FINAL DIAGNOSIS       1. Descending Colon Polyp, Cold snare :       - TUBULAR ADENOMA      - NEGATIVE FOR HIGH-GRADE DYSPLASIA OR MALIGNANCY       DATE SIGNED OUT: 03/22/2023 ELECTRONIC SIGNATURE : Kin M.D., Annah SQUIBB., Pathologist, Electronic Signature  MICROSCOPIC DESCRIPTION  CASE COMMENTS STAINS USED IN DIAGNOSIS: H&E    CLINICAL HISTORY  SPECIMEN(S) OBTAINED 1. Descending Colon Polyp, Cold Snare  SPECIMEN COMMENTS: SPECIMEN CLINICAL INFORMATION: 1. Screening colonoscopy    Gross Description 1. Received in formalin, labeled cold snare  descending colon polyp, is a 0.6 x 0.5 x 0.2 cm soft, tan tissue fragment submitted entirely in block 1A.      SMB      03/19/2023        Report signed out from the following location(s) [place]. [facility] [address] [place], [place] [address] CLIA #: [id number]  [facility] [address] [place], [place] [address] CLIA #: [id number]   Pregnancy, urine POC   Collection Time: 03/19/23  7:00 AM  Result Value Ref Range   Preg Test, Ur NEGATIVE NEGATIVE       Assessment & Plan:   Problem List Items Addressed This Visit       Cardiovascular and Mediastinum   Migraine with aura and without status migrainosus, not intractable - Primary   Relevant Medications   minoxidil  (LONITEN ) 2.5 MG tablet   escitalopram  (LEXAPRO ) 20 MG tablet   Essential hypertension   Relevant Medications   minoxidil  (LONITEN ) 2.5 MG tablet   Other Relevant Orders   CBC with Differential/Platelet   COMPLETE METABOLIC PANEL WITH GFR     Other   Morbid obesity (HCC)   Relevant Orders   TSH   Prediabetes   Relevant Orders   COMPLETE METABOLIC PANEL WITH GFR   Hemoglobin A1c   Anxiety   Relevant  Medications   escitalopram  (LEXAPRO ) 20 MG tablet   Mild episode of recurrent major depressive disorder (HCC)   Relevant Medications   escitalopram  (LEXAPRO ) 20 MG tablet   Hyperlipidemia   Relevant Medications   minoxidil  (LONITEN ) 2.5 MG tablet   Other Relevant Orders   Lipid panel   Other Visit Diagnoses  Female pattern hair loss       Relevant Medications   minoxidil  (LONITEN ) 2.5 MG tablet   Other Relevant Orders   CBC with Differential/Platelet   TSH   VITAMIN D  25 Hydroxy (Vit-D Deficiency, Fractures)   EKG 12-Lead     Medication management       Relevant Orders   EKG 12-Lead        Assessment and Plan    Hair Loss Patient reports hair loss, primarily in the front. No other symptoms reported. Discussed potential side effects of minoxidil , including potential drop in blood pressure. -Start minoxidil  2.5mg  daily. Monitor for side effects and efficacy. ekg performed for baseline  Anxiety Patient reports persistent anxiety despite being on Lexapro  10mg  daily. Discussed increasing the dose. -Increase Lexapro  to 20mg  daily. Finish current 10mg  supply by taking two tablets daily, then switch to one 20mg  tablet daily.  Migraines Patient reports a few recent migraines, but Fioricet is effective when taken as needed. -Continue Fioricet as needed for migraines.  Hypertension Patient's blood pressure is elevated. Patient admits to inconsistent use of antihypertensive medications. -Encouraged consistent use of antihypertensive medications (amlodipine  5mg  daily, losartan  25mg  daily).  General Health Maintenance -Order labs today to monitor cholesterol and prediabetes. -Follow-up on lab results. If abnormal, patient will be contacted immediately. If normal, patient will be contacted on Monday.        Follow up plan: Return in about 6 months (around 02/10/2024) for follow up.

## 2023-08-14 LAB — CBC WITH DIFFERENTIAL/PLATELET
Absolute Lymphocytes: 2075 {cells}/uL (ref 850–3900)
Absolute Monocytes: 498 {cells}/uL (ref 200–950)
Basophils Absolute: 58 {cells}/uL (ref 0–200)
Basophils Relative: 0.7 %
Eosinophils Absolute: 158 {cells}/uL (ref 15–500)
Eosinophils Relative: 1.9 %
HCT: 38.3 % (ref 35.0–45.0)
Hemoglobin: 12.9 g/dL (ref 11.7–15.5)
MCH: 29.3 pg (ref 27.0–33.0)
MCHC: 33.7 g/dL (ref 32.0–36.0)
MCV: 87 fL (ref 80.0–100.0)
MPV: 10.2 fL (ref 7.5–12.5)
Monocytes Relative: 6 %
Neutro Abs: 5511 {cells}/uL (ref 1500–7800)
Neutrophils Relative %: 66.4 %
Platelets: 393 10*3/uL (ref 140–400)
RBC: 4.4 10*6/uL (ref 3.80–5.10)
RDW: 13.6 % (ref 11.0–15.0)
Total Lymphocyte: 25 %
WBC: 8.3 10*3/uL (ref 3.8–10.8)

## 2023-08-14 LAB — COMPLETE METABOLIC PANEL WITH GFR
AG Ratio: 1.7 (calc) (ref 1.0–2.5)
ALT: 43 U/L — ABNORMAL HIGH (ref 6–29)
AST: 24 U/L (ref 10–35)
Albumin: 4.4 g/dL (ref 3.6–5.1)
Alkaline phosphatase (APISO): 73 U/L (ref 31–125)
BUN: 19 mg/dL (ref 7–25)
CO2: 24 mmol/L (ref 20–32)
Calcium: 9.5 mg/dL (ref 8.6–10.2)
Chloride: 103 mmol/L (ref 98–110)
Creat: 0.94 mg/dL (ref 0.50–0.99)
Globulin: 2.6 g/dL (ref 1.9–3.7)
Glucose, Bld: 117 mg/dL — ABNORMAL HIGH (ref 65–99)
Potassium: 3.9 mmol/L (ref 3.5–5.3)
Sodium: 136 mmol/L (ref 135–146)
Total Bilirubin: 0.5 mg/dL (ref 0.2–1.2)
Total Protein: 7 g/dL (ref 6.1–8.1)
eGFR: 76 mL/min/{1.73_m2} (ref >=60)

## 2023-08-14 LAB — VITAMIN D 25 HYDROXY (VIT D DEFICIENCY, FRACTURES): Vit D, 25-Hydroxy: 31 ng/mL (ref 30–100)

## 2023-08-14 LAB — TSH: TSH: 1.61 m[IU]/L

## 2023-08-14 LAB — HEMOGLOBIN A1C
Hgb A1c MFr Bld: 6.1 %{Hb} — ABNORMAL HIGH (ref <5.7)
Mean Plasma Glucose: 128 mg/dL
eAG (mmol/L): 7.1 mmol/L

## 2023-08-14 LAB — LIPID PANEL
Cholesterol: 175 mg/dL (ref <200)
HDL: 53 mg/dL (ref >=50)
LDL Cholesterol (Calc): 96 mg/dL
Non-HDL Cholesterol (Calc): 122 mg/dL (ref <130)
Total CHOL/HDL Ratio: 3.3 (calc) (ref <5.0)
Triglycerides: 164 mg/dL — ABNORMAL HIGH (ref <150)

## 2023-08-16 ENCOUNTER — Encounter: Payer: Self-pay | Admitting: Nurse Practitioner

## 2023-08-17 ENCOUNTER — Encounter (INDEPENDENT_AMBULATORY_CARE_PROVIDER_SITE_OTHER): Payer: Self-pay

## 2023-08-25 ENCOUNTER — Ambulatory Visit
Admission: EM | Admit: 2023-08-25 | Discharge: 2023-08-25 | Disposition: A | Discharge status: Home / Self Care | Payer: BLUE CROSS/BLUE SHIELD | Attending: Emergency Medicine | Admitting: Emergency Medicine

## 2023-08-25 DIAGNOSIS — J101 Influenza due to other identified influenza virus with other respiratory manifestations: Secondary | ICD-10-CM

## 2023-08-25 LAB — POC COVID19/FLU A&B COMBO
Covid Antigen, POC: NEGATIVE
Influenza A Antigen, POC: POSITIVE — AB
Influenza B Antigen, POC: NEGATIVE

## 2023-08-25 MED ORDER — GUAIFENESIN-CODEINE 100-10 MG/5ML PO SOLN
5.0000 mL | Freq: Four times a day (QID) | ORAL | 0 refills | Status: DC | PRN
Start: 2023-08-25 — End: 2023-08-31

## 2023-08-25 MED ORDER — GUAIFENESIN-CODEINE 100-10 MG/5ML PO SOLN
5.0000 mL | Freq: Every evening | ORAL | 0 refills | Status: DC | PRN
Start: 2023-08-25 — End: 2023-08-25

## 2023-08-25 MED ORDER — OSELTAMIVIR PHOSPHATE 75 MG PO CAPS: 75.0000 mg | ORAL_CAPSULE | Freq: Two times a day (BID) | ORAL | 0 refills | Status: DC

## 2023-08-25 MED ORDER — PREDNISONE 20 MG PO TABS: 40.0000 mg | ORAL_TABLET | Freq: Every day | ORAL | 0 refills | Status: DC

## 2023-08-25 NOTE — ED Provider Notes (Signed)
 Shannon Clayton    CSN: [id number] Arrival date & time: 08/25/23  7829      History   Chief Complaint Chief Complaint  Patient presents with   Fever    HPI Shannon Clayton is a 48 y.o. female.   Patient presents for evaluation of a fever peaking at 102, nasal congestion,, intermittent headaches and wheezing with coughing present for 3 days. cough is productive.  No known sick contacts.  Tolerating and and liquids.  Has attempted use of Coricidin and  Afrin.  Denies shortness of breath  Past Medical History:  Diagnosis Date   Depression    Frequent headaches    Hypertension    Migraine headache with aura     Patient Active Problem List   Diagnosis Date Noted   Adenomatous polyp of colon 03/19/2023   Hyperlipidemia 07/10/2022   Mild episode of recurrent major depressive disorder (HCC) 11/03/2021   Hip pain 10/03/2021   ASCUS with positive high risk HPV cervical 07/27/2019   Elevated liver enzymes 06/23/2019   Encounter for screening colonoscopy 06/23/2019   Prediabetes 06/09/2019   Vitamin D deficiency 06/09/2019   Migraine with aura and without status migrainosus, not intractable 05/28/2019   Essential hypertension 05/28/2019   Anxiety and depression 05/28/2019   Morbid obesity (HCC) 05/10/2014   Irregular periods 05/10/2014   Anxiety 11/21/2012    Past Surgical History:  Procedure Laterality Date   CESAREAN SECTION  2006   COLONOSCOPY WITH PROPOFOL N/A 03/19/2023   Procedure: COLONOSCOPY WITH PROPOFOL;  Surgeon: Wyline Mood, MD;  Location: [facility] ENDOSCOPY;  Service: Gastroenterology;  Laterality: N/A;  1ST CASE - PATIENT AWARE   ear pinning  1984   POLYPECTOMY  03/19/2023   Procedure: POLYPECTOMY;  Surgeon: Wyline Mood, MD;  Location: [facility] ENDOSCOPY;  Service: Gastroenterology;;   TONSILLECTOMY AND ADENOIDECTOMY  2011    OB History     Gravida  1   Para  1   Term  1   Preterm      AB      Living  1      SAB      IAB      Ectopic       Multiple      Live Births  1            Home Medications    Prior to Admission medications   Medication Sig Start Date End Date Taking? Authorizing Provider  guaiFENesin-codeine 100-10 MG/5ML syrup Take 5 mLs by mouth every 6 (six) hours as needed for cough. 08/25/23  Yes Reakwon Barren, Elita Boone, NP  oseltamivir (TAMIFLU) 75 MG capsule Take 1 capsule (75 mg total) by mouth every 12 (twelve) hours. 08/25/23  Yes Brittanya Winburn R, NP  predniSONE (DELTASONE) 20 MG tablet Take 2 tablets (40 mg total) by mouth daily. 08/25/23  Yes Bradly Sangiovanni R, NP  amLODipine (NORVASC) 5 MG tablet TAKE 1 TABLET (5 MG TOTAL) BY MOUTH DAILY. 07/21/23   Berniece Salines, FNP  busPIRone (BUSPAR) 5 MG tablet TAKE 1 TABLET BY MOUTH THREE TIMES A DAY AS NEEDED 07/21/23   Berniece Salines, FNP  butalbital-acetaminophen-caffeine (FIORICET) [phone] MG tablet Take 1-2 tablets by mouth daily as needed for headache. Limit use no more 2x per week 10/03/21   Berniece Salines, FNP  co-enzyme Q-10 30 MG capsule Take 30 mg by mouth 3 (three) times daily.    [provider]  escitalopram (LEXAPRO) 20 MG tablet Take 1 tablet (  20 mg total) by mouth daily. 08/13/23   Berniece Salines, FNP  ipratropium (ATROVENT) 0.03 % nasal spray Place 2 sprays into both nostrils every 12 (twelve) hours. 10/27/22   Waldon Merl, PA-C  losartan (COZAAR) 25 MG tablet TAKE 1 TABLET (25 MG TOTAL) BY MOUTH DAILY. 08/04/23   Berniece Salines, FNP  MAGNESIUM PO Take by mouth.    [provider]  meloxicam (MOBIC) 15 MG tablet Take 15 mg by mouth daily. 12/14/22   [provider]  minoxidil (LONITEN) 2.5 MG tablet Take 1 tablet (2.5 mg total) by mouth daily. 08/13/23   Berniece Salines, FNP  Omega-3 Fatty Acids (FISH OIL OMEGA-3 PO) Take by mouth.    [provider]    Family History Family History  Problem Relation Age of Onset   Cancer Mother        MM   Arthritis Mother    Hyperlipidemia Mother    Hypertension  Mother    Anxiety disorder Mother    Arthritis Father    Cancer Maternal Grandmother        lymphoma 2x   Hypertension Maternal Grandmother    Hypertension Maternal Grandfather    Diabetes Maternal Grandfather    Cancer Paternal Grandfather        skin cancer    Breast cancer Neg Hx     Social History Social History   Tobacco Use   Smoking status: Never   Smokeless tobacco: Never  Vaping Use   Vaping status: Never Used  Substance Use Topics   Alcohol use: Yes   Drug use: No     Allergies   Sumatriptan   Review of Systems Review of Systems   Physical Exam Triage Vital Signs ED Triage Vitals  Encounter Vitals Group     BP 08/25/23 0846 (!) 142/79     Systolic BP Percentile --      Diastolic BP Percentile --      Pulse Rate 08/25/23 0846 (!) 107     Resp 08/25/23 0846 20     Temp 08/25/23 0846 99 F (37.2 C)     Temp src --      SpO2 08/25/23 0846 94 %     Weight --      Height --      Head Circumference --      Peak Flow --      Pain Score 08/25/23 0832 3     Pain Loc --      Pain Education --      Exclude from Growth Chart --    No data found.  Updated Vital Signs BP (!) 142/79   Pulse (!) 107   Temp 99 F (37.2 C)   Resp 20   LMP 08/15/2023   SpO2 94%   Visual Acuity Right Eye Distance:   Left Eye Distance:   Bilateral Distance:    Right Eye Near:   Left Eye Near:    Bilateral Near:     Physical Exam Constitutional:      Appearance: Normal appearance.  HENT:     Right Ear: Tympanic membrane, ear canal and external ear normal.     Left Ear: Tympanic membrane, ear canal and external ear normal.     Nose: Congestion present. No rhinorrhea.     Mouth/Throat:     Mouth: Mucous membranes are moist.     Pharynx: Oropharynx is clear.  Eyes:     Extraocular Movements: Extraocular movements  intact.  Cardiovascular:     Rate and Rhythm: Normal rate and regular rhythm.     Pulses: Normal pulses.     Heart sounds: Normal heart sounds.   Pulmonary:     Effort: Pulmonary effort is normal.     Breath sounds: Normal breath sounds.  Musculoskeletal:     Cervical back: Normal range of motion and neck supple.  Neurological:     Mental Status: She is alert and oriented to person, place, and time. Mental status is at baseline.      UC Treatments / Results  Labs (all labs ordered are listed, but only abnormal results are displayed) Labs Reviewed  POC COVID19/FLU A&B COMBO - Abnormal; Notable for the following components:      Result Value   Influenza A Antigen, POC Positive (*)    All other components within normal limits    EKG   Radiology No results found.  Procedures Procedures (including critical care time)  Medications Ordered in UC Medications - No data to display  Initial Impression / Assessment and Plan / UC Course  I have reviewed the triage vital signs and the nursing notes.  Pertinent labs & imaging results that were available during my care of the patient were reviewed by me and considered in my medical decision making (see chart for details).  Influenza A  Patient is in no signs of distress nor toxic appearing.  Vital signs are stable.  Low suspicion for pneumonia, pneumothorax or bronchitis and therefore will defer imaging.  Prescribed Tamiflu, Tessalon and guaifenesin codeine, PDMP reviewed,low risk. May use additional over-the-counter medications as needed for supportive care.  May follow-up with urgent care as needed if symptoms persist or worsen.  Note given.   Final Clinical Impressions(s) / UC Diagnoses   Final diagnoses:  Influenza A     Discharge Instructions      Influenza A is a virus and should steadily improve in time it can take up to 7 to 10 days before you truly start to see a turnaround however things will get better  Begin Tamiflu every morning and every evening for 5 days to reduce the amount of virus in the body which helps to minimize symptoms  Use cough syrup at  nighttime to rest  Begin prednisone every morning with food to open and relax the airway    You can take Tylenol and/or Ibuprofen as needed for fever reduction and pain relief.   For cough: honey 1/2 to 1 teaspoon (you can dilute the honey in water or another fluid).  You can also use guaifenesin and dextromethorphan for cough. You can use a humidifier for chest congestion and cough.  If you don't have a humidifier, you can sit in the bathroom with the hot shower running.      For sore throat: try warm salt water gargles, cepacol lozenges, throat spray, warm tea or water with lemon/honey, popsicles or ice, or OTC cold relief medicine for throat discomfort.   For congestion: take a daily anti-histamine like Zyrtec, Claritin, and a oral decongestant, such as pseudoephedrine.  You can also use Flonase 1-2 sprays in each nostril daily.   It is important to stay hydrated: drink plenty of fluids (water, gatorade/powerade/pedialyte, juices, or teas) to keep your throat moisturized and help further relieve irritation/discomfort.    ED Prescriptions     Medication Sig Dispense Auth. Provider   predniSONE (DELTASONE) 20 MG tablet Take 2 tablets (40 mg total) by mouth daily. 10  tablet Salli Quarry R, NP   guaiFENesin-codeine 100-10 MG/5ML syrup  (Status: Discontinued) Take 5 mLs by mouth at bedtime as needed for cough. 120 mL Kazoua Gossen, Hansel Starling R, NP   oseltamivir (TAMIFLU) 75 MG capsule Take 1 capsule (75 mg total) by mouth every 12 (twelve) hours. 10 capsule Carlee Tesfaye R, NP   guaiFENesin-codeine 100-10 MG/5ML syrup Take 5 mLs by mouth every 6 (six) hours as needed for cough. 120 mL Mari Battaglia, Elita Boone, NP      I have reviewed the PDMP during this encounter.   Valinda Hoar, [place] 08/25/23 [phone]

## 2023-08-25 NOTE — Discharge Instructions (Signed)
 Influenza A is a virus and should steadily improve in time it can take up to 7 to 10 days before you truly start to see a turnaround however things will get better  Begin Tamiflu every morning and every evening for 5 days to reduce the amount of virus in the body which helps to minimize symptoms  Use cough syrup at nighttime to rest  Begin prednisone every morning with food to open and relax the airway    You can take Tylenol and/or Ibuprofen as needed for fever reduction and pain relief.   For cough: honey 1/2 to 1 teaspoon (you can dilute the honey in water or another fluid).  You can also use guaifenesin and dextromethorphan for cough. You can use a humidifier for chest congestion and cough.  If you don't have a humidifier, you can sit in the bathroom with the hot shower running.      For sore throat: try warm salt water gargles, cepacol lozenges, throat spray, warm tea or water with lemon/honey, popsicles or ice, or OTC cold relief medicine for throat discomfort.   For congestion: take a daily anti-histamine like Zyrtec, Claritin, and a oral decongestant, such as pseudoephedrine.  You can also use Flonase 1-2 sprays in each nostril daily.   It is important to stay hydrated: drink plenty of fluids (water, gatorade/powerade/pedialyte, juices, or teas) to keep your throat moisturized and help further relieve irritation/discomfort.

## 2023-08-25 NOTE — ED Triage Notes (Signed)
 Patient to Urgent Care with complaints of fevers (102.9)/ nasal, chest, and head congestion/ cough  Reports symptoms started Sunday. Temp 102 this am.  Meds: Coricidin (w/ tylenol)/ ibuprofen this am.

## 2023-08-31 ENCOUNTER — Telehealth: Payer: BLUE CROSS/BLUE SHIELD | Admitting: Physician Assistant

## 2023-08-31 DIAGNOSIS — J208 Acute bronchitis due to other specified organisms: Secondary | ICD-10-CM

## 2023-08-31 DIAGNOSIS — B9689 Other specified bacterial agents as the cause of diseases classified elsewhere: Secondary | ICD-10-CM | POA: Diagnosis not present

## 2023-08-31 MED ORDER — AZITHROMYCIN 250 MG PO TABS
ORAL_TABLET | ORAL | 0 refills | Status: AC
Start: 2023-08-31 — End: 2023-09-05

## 2023-08-31 MED ORDER — PROMETHAZINE-DM 6.25-15 MG/5ML PO SYRP: 5.0000 mL | ORAL_SOLUTION | Freq: Four times a day (QID) | ORAL | 0 refills | Status: DC | PRN

## 2023-08-31 MED ORDER — BENZONATATE 100 MG PO CAPS: 100.0000 mg | ORAL_CAPSULE | Freq: Three times a day (TID) | ORAL | 0 refills | Status: DC | PRN

## 2023-08-31 NOTE — Patient Instructions (Signed)
 Shannon Clayton, thank you for joining Margaretann Loveless, PA-C for today's virtual visit.  While this provider is not your primary care provider (PCP), if your PCP is located in our provider database this encounter information will be shared with them immediately following your visit.   A [facility] MyChart account gives you access to today's visit and all your visits, tests, and labs performed at [facility] " click here if you don't have a [facility] MyChart account or go to [email or web address]  Consent: (Patient) Shannon Clayton provided verbal consent for this virtual visit at the beginning of the encounter.  Current Medications:  Current Outpatient Medications:    azithromycin (ZITHROMAX) 250 MG tablet, Take 2 tablets on day 1, then 1 tablet daily on days 2 through 5, Disp: 6 tablet, Rfl: 0   benzonatate (TESSALON) 100 MG capsule, Take 1-2 capsules (100-200 mg total) by mouth 3 (three) times daily as needed., Disp: 30 capsule, Rfl: 0   promethazine-dextromethorphan (PROMETHAZINE-DM) 6.25-15 MG/5ML syrup, Take 5 mLs by mouth 4 (four) times daily as needed., Disp: 118 mL, Rfl: 0   amLODipine (NORVASC) 5 MG tablet, TAKE 1 TABLET (5 MG TOTAL) BY MOUTH DAILY., Disp: 30 tablet, Rfl: 1   busPIRone (BUSPAR) 5 MG tablet, TAKE 1 TABLET BY MOUTH THREE TIMES A DAY AS NEEDED, Disp: 90 tablet, Rfl: 2   butalbital-acetaminophen-caffeine (FIORICET) 50-325-40 MG tablet, Take 1-2 tablets by mouth daily as needed for headache. Limit use no more 2x per week, Disp: 30 tablet, Rfl: 2   co-enzyme Q-10 30 MG capsule, Take 30 mg by mouth 3 (three) times daily., Disp: , Rfl:    escitalopram (LEXAPRO) 20 MG tablet, Take 1 tablet (20 mg total) by mouth daily., Disp: 30 tablet, Rfl: 0   ipratropium (ATROVENT) 0.03 % nasal spray, Place 2 sprays into both nostrils every 12 (twelve) hours., Disp: 30 mL, Rfl: 0   losartan (COZAAR) 25 MG tablet, TAKE 1 TABLET (25 MG TOTAL) BY MOUTH DAILY.,  Disp: 90 tablet, Rfl: 0   MAGNESIUM PO, Take by mouth., Disp: , Rfl:    meloxicam (MOBIC) 15 MG tablet, Take 15 mg by mouth daily., Disp: , Rfl:    minoxidil (LONITEN) 2.5 MG tablet, Take 1 tablet (2.5 mg total) by mouth daily., Disp: 30 tablet, Rfl: 0   Omega-3 Fatty Acids (FISH OIL OMEGA-3 PO), Take by mouth., Disp: , Rfl:    oseltamivir (TAMIFLU) 75 MG capsule, Take 1 capsule (75 mg total) by mouth every 12 (twelve) hours., Disp: 10 capsule, Rfl: 0   predniSONE (DELTASONE) 20 MG tablet, Take 2 tablets (40 mg total) by mouth daily., Disp: 10 tablet, Rfl: 0   Medications ordered in this encounter:  Meds ordered this encounter  Medications   azithromycin (ZITHROMAX) 250 MG tablet    Sig: Take 2 tablets on day 1, then 1 tablet daily on days 2 through 5    Dispense:  6 tablet    Refill:  0    Supervising Provider:   Merrilee Jansky [[id number]]   promethazine-dextromethorphan (PROMETHAZINE-DM) 6.25-15 MG/5ML syrup    Sig: Take 5 mLs by mouth 4 (four) times daily as needed.    Dispense:  118 mL    Refill:  0    Supervising Provider:   Merrilee Jansky [[id number]]   benzonatate (TESSALON) 100 MG capsule    Sig: Take 1-2 capsules (100-200 mg total) by mouth 3 (three) times daily as needed.    Dispense:  30  capsule    Refill:  0    Supervising Provider:   Merrilee Jansky [[id number]]     *If you need refills on other medications prior to your next appointment, please contact your pharmacy*  Follow-Up: Call back or seek an in-person evaluation if the symptoms worsen or if the condition fails to improve as anticipated.  [facility] [phone]  Other Instructions  Acute Bronchitis, Adult  Acute bronchitis is sudden inflammation of the main airways (bronchi) that come off the windpipe (trachea) in the lungs. The swelling causes the airways to get smaller and make more mucus than normal. This can make it hard to breathe and can cause coughing or noisy breathing  (wheezing). Acute bronchitis may last several weeks. The cough may last longer. Allergies, asthma, and exposure to smoke may make the condition worse. What are the causes? This condition can be caused by germs and by substances that irritate the lungs, including: Cold and flu viruses. The most common cause of this condition is the virus that causes the common cold. Bacteria. This is less common. Breathing in substances that irritate the lungs, including: Smoke from cigarettes and other forms of tobacco. Dust and pollen. Fumes from household cleaning products, gases, or burned fuel. Indoor or outdoor air pollution. What increases the risk? The following factors may make you more likely to develop this condition: A weak body's defense system, also called the immune system. A condition that affects your lungs and breathing, such as asthma. What are the signs or symptoms? Common symptoms of this condition include: Coughing. This may bring up clear, yellow, or green mucus from your lungs (sputum). Wheezing. Runny or stuffy nose. Having too much mucus in your lungs (chest congestion). Shortness of breath. Aches and pains, including sore throat or chest. How is this diagnosed? This condition is usually diagnosed based on: Your symptoms and medical history. A physical exam. You may also have other tests, including tests to rule out other conditions, such as pneumonia. These tests include: A test of lung function. Test of a mucus sample to look for the presence of bacteria. Tests to check the oxygen level in your blood. Blood tests. Chest X-ray. How is this treated? Most cases of acute bronchitis clear up over time without treatment. Your health care provider may recommend: Drinking more fluids to help thin your mucus so it is easier to cough up. Taking inhaled medicine (inhaler) to improve air flow in and out of your lungs. Using a vaporizer or a humidifier. These are machines that add  water to the air to help you breathe better. Taking a medicine that thins mucus and clears congestion (expectorant). Taking a medicine that prevents or stops coughing (cough suppressant). It is not common to take an antibiotic medicine for this condition. Follow these instructions at home:  Take over-the-counter and prescription medicines only as told by your health care provider. Use an inhaler, vaporizer, or humidifier as told by your health care provider. Take two teaspoons (10 mL) of honey at bedtime to lessen coughing at night. Drink enough fluid to keep your urine pale yellow. Do not use any products that contain nicotine or tobacco. These products include cigarettes, chewing tobacco, and vaping devices, such as e-cigarettes. If you need help quitting, ask your health care provider. Get plenty of rest. Return to your normal activities as told by your health care provider. Ask your health care provider what activities are safe for you. Keep all follow-up visits. This  is important. How is this prevented? To lower your risk of getting this condition again: Wash your hands often with soap and water for at least 20 seconds. If soap and water are not available, use hand sanitizer. Avoid contact with people who have cold symptoms. Try not to touch your mouth, nose, or eyes with your hands. Avoid breathing in smoke or chemical fumes. Breathing smoke or chemical fumes will make your condition worse. Get the flu shot every year. Contact a health care provider if: Your symptoms do not improve after 2 weeks. You have trouble coughing up the mucus. Your cough keeps you awake at night. You have a fever. Get help right away if you: Cough up blood. Feel pain in your chest. Have severe shortness of breath. Faint or keep feeling like you are going to faint. Have a severe headache. Have a fever or chills that get worse. These symptoms may represent a serious problem that is an emergency. Do not  wait to see if the symptoms will go away. Get medical help right away. Call your local emergency services (911 in the [place] Do not drive yourself to the hospital. Summary Acute bronchitis is inflammation of the main airways (bronchi) that come off the windpipe (trachea) in the lungs. The swelling causes the airways to get smaller and make more mucus than normal. Drinking more fluids can help thin your mucus so it is easier to cough up. Take over-the-counter and prescription medicines only as told by your health care provider. Do not use any products that contain nicotine or tobacco. These products include cigarettes, chewing tobacco, and vaping devices, such as e-cigarettes. If you need help quitting, ask your health care provider. Contact a health care provider if your symptoms do not improve after 2 weeks. This information is not intended to replace advice given to you by your health care provider. Make sure you discuss any questions you have with your health care provider. Document Revised: 10/02/2021 Document Reviewed: 10/23/2020 Elsevier Patient Education  2024 Elsevier Inc.   If you have been instructed to have an in-person evaluation today at a local Urgent Care facility, please use the link below. It will take you to a list of all of our available [facility], including address, phone number and hours of operation. Please do not delay care.  [facility]  If you or a family member do not have a primary care provider, use the link below to schedule a visit and establish care. When you choose a [facility] primary care physician or advanced practice provider, you gain a long-term partner in health. Find a Primary Care Provider  Learn more about [facility] in-office and virtual care options: [facility] - Get Care Now

## 2023-08-31 NOTE — Progress Notes (Signed)
 Virtual Visit Consent   Shannon Clayton, you are scheduled for a virtual visit with a [facility] provider today. Just as with appointments in the office, your consent must be obtained to participate. Your consent will be active for this visit and any virtual visit you may have with one of our providers in the next 365 days. If you have a MyChart account, a copy of this consent can be sent to you electronically.  As this is a virtual visit, video technology does not allow for your provider to perform a traditional examination. This may limit your provider's ability to fully assess your condition. If your provider identifies any concerns that need to be evaluated in person or the need to arrange testing (such as labs, EKG, etc.), we will make arrangements to do so. Although advances in technology are sophisticated, we cannot ensure that it will always work on either your end or our end. If the connection with a video visit is poor, the visit may have to be switched to a telephone visit. With either a video or telephone visit, we are not always able to ensure that we have a secure connection.  By engaging in this virtual visit, you consent to the provision of healthcare and authorize for your insurance to be billed (if applicable) for the services provided during this visit. Depending on your insurance coverage, you may receive a charge related to this service.  I need to obtain your verbal consent now. Are you willing to proceed with your visit today? Shannon Clayton has provided verbal consent on 08/31/2023 for a virtual visit (video or telephone). Margaretann Loveless, PA-C  Date: 08/31/2023 3:24 PM   Virtual Visit via Video Note   I, Margaretann Loveless, connected with  Shannon Clayton  ([id number], [birth year]) on 08/31/23 at  3:15 PM EST by a video-enabled telemedicine application and verified that I am speaking with the correct person using two identifiers.  Location: Patient:  Virtual Visit Location Patient: Home Provider: Virtual Visit Location Provider: Home Office   I discussed the limitations of evaluation and management by telemedicine and the availability of in person appointments. The patient expressed understanding and agreed to proceed.    History of Present Illness: Shannon Clayton is a 48 y.o. who identifies as a female who was assigned female at birth, and is being seen today for cough and congestion following the flu.  HPI: Cough This is a new problem. The current episode started in the past 7 days (Diagnosed with Influenza A on 08/25/23 at in person UC; Prescriebd Tamiflu, Prednisone, and Guaifenesin-Codeine cough syrup; Still having continued cough). The problem has been unchanged. The problem occurs every few minutes. The cough is Productive of sputum and productive of purulent sputum. Associated symptoms include chest pain (tightness and burning), chills, a fever (with flu, highest 102.9, now resolved), headaches (with cough), myalgias and postnasal drip. Pertinent negatives include no ear congestion, ear pain, nasal congestion, rhinorrhea, sore throat, shortness of breath or wheezing. The symptoms are aggravated by lying down and cold air. Treatments tried: tamiflu, prednisone, guaifenesin-codeine. The treatment provided no relief. Her past medical history is significant for bronchitis and pneumonia.     Problems:  Patient Active Problem List   Diagnosis Date Noted   Adenomatous polyp of colon 03/19/2023   Hyperlipidemia 07/10/2022   Mild episode of recurrent major depressive disorder (HCC) 11/03/2021   Hip pain 10/03/2021   ASCUS with positive high risk HPV cervical 07/27/2019  Elevated liver enzymes 06/23/2019   Encounter for screening colonoscopy 06/23/2019   Prediabetes 06/09/2019   Vitamin D deficiency 06/09/2019   Migraine with aura and without status migrainosus, not intractable 05/28/2019   Essential hypertension 05/28/2019    Anxiety and depression 05/28/2019   Morbid obesity (HCC) 05/10/2014   Irregular periods 05/10/2014   Anxiety 11/21/2012    Allergies:  Allergies  Allergen Reactions   Sumatriptan Other (See Comments)    myalgias  Myalgias; makes shoulders and neck hurt  Myalgias; makes shoulders and neck hurt    myalgias Myalgias; makes shoulders and neck hurt   Medications:  Current Outpatient Medications:    azithromycin (ZITHROMAX) 250 MG tablet, Take 2 tablets on day 1, then 1 tablet daily on days 2 through 5, Disp: 6 tablet, Rfl: 0   benzonatate (TESSALON) 100 MG capsule, Take 1-2 capsules (100-200 mg total) by mouth 3 (three) times daily as needed., Disp: 30 capsule, Rfl: 0   promethazine-dextromethorphan (PROMETHAZINE-DM) 6.25-15 MG/5ML syrup, Take 5 mLs by mouth 4 (four) times daily as needed., Disp: 118 mL, Rfl: 0   amLODipine (NORVASC) 5 MG tablet, TAKE 1 TABLET (5 MG TOTAL) BY MOUTH DAILY., Disp: 30 tablet, Rfl: 1   busPIRone (BUSPAR) 5 MG tablet, TAKE 1 TABLET BY MOUTH THREE TIMES A DAY AS NEEDED, Disp: 90 tablet, Rfl: 2   butalbital-acetaminophen-caffeine (FIORICET) 50-325-40 MG tablet, Take 1-2 tablets by mouth daily as needed for headache. Limit use no more 2x per week, Disp: 30 tablet, Rfl: 2   co-enzyme Q-10 30 MG capsule, Take 30 mg by mouth 3 (three) times daily., Disp: , Rfl:    escitalopram (LEXAPRO) 20 MG tablet, Take 1 tablet (20 mg total) by mouth daily., Disp: 30 tablet, Rfl: 0   ipratropium (ATROVENT) 0.03 % nasal spray, Place 2 sprays into both nostrils every 12 (twelve) hours., Disp: 30 mL, Rfl: 0   losartan (COZAAR) 25 MG tablet, TAKE 1 TABLET (25 MG TOTAL) BY MOUTH DAILY., Disp: 90 tablet, Rfl: 0   MAGNESIUM PO, Take by mouth., Disp: , Rfl:    meloxicam (MOBIC) 15 MG tablet, Take 15 mg by mouth daily., Disp: , Rfl:    minoxidil (LONITEN) 2.5 MG tablet, Take 1 tablet (2.5 mg total) by mouth daily., Disp: 30 tablet, Rfl: 0   Omega-3 Fatty Acids (FISH OIL OMEGA-3 PO), Take  by mouth., Disp: , Rfl:    oseltamivir (TAMIFLU) 75 MG capsule, Take 1 capsule (75 mg total) by mouth every 12 (twelve) hours., Disp: 10 capsule, Rfl: 0   predniSONE (DELTASONE) 20 MG tablet, Take 2 tablets (40 mg total) by mouth daily., Disp: 10 tablet, Rfl: 0  Observations/Objective: Patient is well-developed, well-nourished in no acute distress.  Resting comfortably at home.  Head is normocephalic, atraumatic.  No labored breathing.  Speech is clear and coherent with logical content.  Patient is alert and oriented at baseline.    Assessment and Plan: 1. Acute bacterial bronchitis (Primary) - azithromycin (ZITHROMAX) 250 MG tablet; Take 2 tablets on day 1, then 1 tablet daily on days 2 through 5  Dispense: 6 tablet; Refill: 0 - promethazine-dextromethorphan (PROMETHAZINE-DM) 6.25-15 MG/5ML syrup; Take 5 mLs by mouth 4 (four) times daily as needed.  Dispense: 118 mL; Refill: 0 - benzonatate (TESSALON) 100 MG capsule; Take 1-2 capsules (100-200 mg total) by mouth 3 (three) times daily as needed.  Dispense: 30 capsule; Refill: 0  - Suspect second sickening following recent influenza - Will treat with Z-pack, Promethazine DM, and  tessalon perles - Can continue Mucinex  - Push fluids.  - Rest.  - Steam and humidifier can help - Seek in person evaluation if worsening or symptoms fail to improve    Follow Up Instructions: I discussed the assessment and treatment plan with the patient. The patient was provided an opportunity to ask questions and all were answered. The patient agreed with the plan and demonstrated an understanding of the instructions.  A copy of instructions were sent to the patient via MyChart unless otherwise noted below.    The patient was advised to call back or seek an in-person evaluation if the symptoms worsen or if the condition fails to improve as anticipated.    Margaretann Loveless, PA-C

## 2023-09-05 ENCOUNTER — Other Ambulatory Visit: Payer: Self-pay | Admitting: Nurse Practitioner

## 2023-09-05 DIAGNOSIS — F330 Major depressive disorder, recurrent, mild: Secondary | ICD-10-CM

## 2023-09-05 DIAGNOSIS — F419 Anxiety disorder, unspecified: Secondary | ICD-10-CM

## 2023-09-05 DIAGNOSIS — L658 Other specified nonscarring hair loss: Secondary | ICD-10-CM

## 2023-09-06 NOTE — Telephone Encounter (Signed)
 Requested medications are due for refill today.  yes  Requested medications are on the active medications list.  yes  Last refill. 08/13/2023 #30 0 rf  Future visit scheduled.   yes  Notes to clinic.  Refill not delegated.    Requested Prescriptions  Pending Prescriptions Disp Refills   minoxidil (LONITEN) 2.5 MG tablet [Pharmacy Med Name: MINOXIDIL 2.5 MG TABLET] 90 tablet 1    Sig: TAKE 1 TABLET BY MOUTH EVERY DAY     Not Delegated - Cardiovascular: Vasodilators - minoxidil Failed - 09/06/2023  5:13 PM      Failed - This refill cannot be delegated      Failed - Last BP in normal range    BP Readings from Last 1 Encounters:  08/25/23 (!) 142/79         Failed - Valid encounter within last 3 months    Recent Outpatient Visits           3 months ago Migraine with aura and without status migrainosus, not intractable   [facility] Berniece Salines, FNP   9 months ago Morbid obesity [facility])   [facility] Berniece Salines, FNP   10 months ago Morbid obesity [facility])   [facility] Berniece Salines, FNP   11 months ago Morbid obesity [facility])   [facility] Berniece Salines, FNP   1 year ago Essential hypertension   [facility] Berniece Salines, FNP       Future Appointments             In 2 months Deirdre Evener, MD [facility]   In 5 months Zane Herald, Rudolpho Sevin, FNP [facility]            Passed - Cl in normal range and within 360 days    Chloride  Date Value Ref Range Status  08/13/2023 103 98 - 110 mmol/L Final         Passed - K in normal range and within 360 days    Potassium  Date Value Ref Range Status  08/13/2023 3.9 3.5 - 5.3 mmol/L Final         Passed - Na in normal range and within 360 days    Sodium  Date Value Ref Range Status  08/13/2023 136 135 - 146 mmol/L Final          Passed - Last Heart Rate in normal range    Pulse Readings from Last 1 Encounters:  08/25/23 (!) 107         Passed - Weight completed in the last 12 months    Wt Readings from Last 1 Encounters:  08/13/23 (!) 343 lb (155.6 kg)         Signed Prescriptions Disp Refills   escitalopram (LEXAPRO) 20 MG tablet 90 tablet 0    Sig: TAKE 1 TABLET BY MOUTH EVERY DAY     Psychiatry:  Antidepressants - SSRI Passed - 09/06/2023  5:13 PM      Passed - Completed PHQ-2 or PHQ-9 in the last 360 days      Passed - Valid encounter within last 6 months    Recent Outpatient Visits           3 months ago Migraine with aura and without status migrainosus, not intractable   [facility], Rudolpho Sevin,  FNP   9 months ago Morbid obesity [facility])   [facility] Berniece Salines, FNP   10 months ago Morbid obesity [facility])   [facility] Berniece Salines, FNP   11 months ago Morbid obesity [facility])   [facility] Berniece Salines, FNP   1 year ago Essential hypertension   [facility] Berniece Salines, FNP       Future Appointments             In 2 months Deirdre Evener, MD [facility]   In 5 months Zane Herald, Rudolpho Sevin, FNP [facility], [facility]

## 2023-09-06 NOTE — Telephone Encounter (Signed)
 Requested Prescriptions  Pending Prescriptions Disp Refills   minoxidil (LONITEN) 2.5 MG tablet [Pharmacy Med Name: MINOXIDIL 2.5 MG TABLET] 90 tablet 1    Sig: TAKE 1 TABLET BY MOUTH EVERY DAY     Not Delegated - Cardiovascular: Vasodilators - minoxidil Failed - 09/06/2023  5:13 PM      Failed - This refill cannot be delegated      Failed - Last BP in normal range    BP Readings from Last 1 Encounters:  08/25/23 (!) 142/79         Failed - Valid encounter within last 3 months    Recent Outpatient Visits           3 months ago Migraine with aura and without status migrainosus, not intractable   [facility] Berniece Salines, FNP   9 months ago Morbid obesity [facility])   [facility] Berniece Salines, FNP   10 months ago Morbid obesity [facility])   [place] [facility] Berniece Salines, FNP   11 months ago Morbid obesity [facility])   [place] [facility] Berniece Salines, FNP   1 year ago Essential hypertension   [facility] Berniece Salines, FNP       Future Appointments             In 2 months Deirdre Evener, MD [place] [facility]   In 5 months Zane Herald, Rudolpho Sevin, FNP [facility]            Passed - Cl in normal range and within 360 days    Chloride  Date Value Ref Range Status  08/13/2023 103 98 - 110 mmol/L Final         Passed - K in normal range and within 360 days    Potassium  Date Value Ref Range Status  08/13/2023 3.9 3.5 - 5.3 mmol/L Final         Passed - Na in normal range and within 360 days    Sodium  Date Value Ref Range Status  08/13/2023 136 135 - 146 mmol/L Final         Passed - Last Heart Rate in normal range    Pulse Readings from Last 1 Encounters:  08/25/23 (!) 107         Passed - Weight completed in the last 12 months    Wt Readings from Last 1 Encounters:  08/13/23 (!) 343 lb (155.6 kg)           escitalopram (LEXAPRO) 20 MG tablet [Pharmacy Med Name: ESCITALOPRAM 20 MG TABLET] 90 tablet 1    Sig: TAKE 1 TABLET BY MOUTH EVERY DAY     Psychiatry:  Antidepressants - SSRI Passed - 09/06/2023  5:13 PM      Passed - Completed PHQ-2 or PHQ-9 in the last 360 days      Passed - Valid encounter within last 6 months    Recent Outpatient Visits           3 months ago Migraine with aura and without status migrainosus, not intractable   [facility] Berniece Salines, FNP   9 months ago Morbid obesity [facility])   [facility] [place] [facility] Berniece Salines, FNP   10 months ago Morbid obesity [facility])   [facility] Berniece Salines, FNP   11 months  ago Morbid obesity [facility])   [facility] Salines, FNP   1 year ago Essential hypertension   [facility] Berniece Salines, FNP       Future Appointments             In 2 months Deirdre Evener, MD [facility]   In 5 months Zane Herald, Rudolpho Sevin, FNP [facility]

## 2023-09-07 ENCOUNTER — Encounter (INDEPENDENT_AMBULATORY_CARE_PROVIDER_SITE_OTHER): Payer: Self-pay

## 2023-09-16 ENCOUNTER — Other Ambulatory Visit: Payer: Self-pay | Admitting: Nurse Practitioner

## 2023-09-16 DIAGNOSIS — I10 Essential (primary) hypertension: Secondary | ICD-10-CM

## 2023-09-16 NOTE — Telephone Encounter (Signed)
 Requested Prescriptions  Pending Prescriptions Disp Refills   amLODipine (NORVASC) 5 MG tablet [Pharmacy Med Name: AMLODIPINE BESYLATE 5 MG TAB] 90 tablet 1    Sig: TAKE 1 TABLET (5 MG TOTAL) BY MOUTH DAILY.     Cardiovascular: Calcium Channel Blockers 2 Failed - 09/16/2023 12:31 PM      Failed - Last BP in normal range    BP Readings from Last 1 Encounters:  08/25/23 (!) 142/79         Passed - Last Heart Rate in normal range    Pulse Readings from Last 1 Encounters:  08/25/23 (!) 107         Passed - Valid encounter within last 6 months    Recent Outpatient Visits           4 months ago Migraine with aura and without status migrainosus, not intractable   [facility] Berniece Salines, FNP   9 months ago Morbid [facility])   [facility] Berniece Salines, FNP   10 months ago [facility]bid [facility])   [facility] Berniece Salines, FNP   12 months ago Morbid [facility])   [facility] Berniece Salines, FNP   1 year ago Essential hypertension   [facility] Berniece Salines, FNP       Future Appointments             In 2 months Deirdre Evener, MD [facility]   In 4 months Zane Herald, Rudolpho Sevin, FNP [facility], [facility]

## 2023-10-14 ENCOUNTER — Other Ambulatory Visit: Payer: Self-pay | Admitting: Nurse Practitioner

## 2023-10-14 DIAGNOSIS — F419 Anxiety disorder, unspecified: Secondary | ICD-10-CM

## 2023-10-14 DIAGNOSIS — I10 Essential (primary) hypertension: Secondary | ICD-10-CM

## 2023-10-14 NOTE — Telephone Encounter (Signed)
 Requested Prescriptions  Pending Prescriptions Disp Refills   busPIRone (BUSPAR) 5 MG tablet [Pharmacy Med Name: BUSPIRONE HCL 5 MG TABLET] 90 tablet 2    Sig: TAKE 1 TABLET BY MOUTH THREE TIMES A DAY AS NEEDED     Psychiatry: Anxiolytics/Hypnotics - Non-controlled Passed - 10/14/2023  2:27 PM      Passed - Valid encounter within last 12 months    Recent Outpatient Visits           2 months ago Migraine with aura and without status migrainosus, not intractable   [facility] Berniece Salines, FNP       Future Appointments             In 1 month Deirdre Evener, MD [facility]   In 4 months Zane Herald, Rudolpho Sevin, FNP [facility]             losartan (COZAAR) 25 MG tablet [Pharmacy Med Name: LOSARTAN POTASSIUM 25 MG TAB] 30 tablet 2    Sig: TAKE 1 TABLET (25 MG TOTAL) BY MOUTH DAILY.     Cardiovascular:  Angiotensin Receptor Blockers Failed - 10/14/2023  2:27 PM      Failed - Last BP in normal range    BP Readings from Last 1 Encounters:  08/25/23 (!) 142/79         Passed - Cr in normal range and within 180 days    Creat  Date Value Ref Range Status  08/13/2023 0.94 0.50 - 0.99 mg/dL Final         Passed - K in normal range and within 180 days    Potassium  Date Value Ref Range Status  08/13/2023 3.9 3.5 - 5.3 mmol/L Final         Passed - Patient is not pregnant      Passed - Valid encounter within last 6 months    Recent Outpatient Visits           2 months ago Migraine with aura and without status migrainosus, not intractable   [facility] Berniece Salines, FNP       Future Appointments             In 1 month Deirdre Evener, MD [facility] [place] [facility]   In 4 months Zane Herald, Rudolpho Sevin, FNP [facility], [facility]

## 2023-10-18 ENCOUNTER — Encounter (INDEPENDENT_AMBULATORY_CARE_PROVIDER_SITE_OTHER): Payer: Self-pay

## 2023-10-18 ENCOUNTER — Encounter (INDEPENDENT_AMBULATORY_CARE_PROVIDER_SITE_OTHER): Payer: Self-pay | Admitting: Family Medicine

## 2023-11-29 ENCOUNTER — Other Ambulatory Visit: Payer: Self-pay | Admitting: Nurse Practitioner

## 2023-11-29 DIAGNOSIS — F330 Major depressive disorder, recurrent, mild: Secondary | ICD-10-CM

## 2023-11-29 DIAGNOSIS — F419 Anxiety disorder, unspecified: Secondary | ICD-10-CM

## 2023-12-01 ENCOUNTER — Ambulatory Visit: Payer: BLUE CROSS/BLUE SHIELD | Admitting: Dermatology

## 2023-12-01 ENCOUNTER — Encounter (INDEPENDENT_AMBULATORY_CARE_PROVIDER_SITE_OTHER): Payer: Self-pay

## 2023-12-02 NOTE — Telephone Encounter (Signed)
 Requested Prescriptions  Pending Prescriptions Disp Refills   escitalopram  (LEXAPRO ) 20 MG tablet [Pharmacy Med Name: ESCITALOPRAM  20 MG TABLET] 30 tablet 2    Sig: TAKE 1 TABLET BY MOUTH EVERY DAY     Psychiatry:  Antidepressants - SSRI Passed - 12/02/2023  8:02 AM      Passed - Completed PHQ-2 or PHQ-9 in the last 360 days      Passed - Valid encounter within last 6 months    Recent Outpatient Visits           3 months ago Migraine with aura and without status migrainosus, not intractable   [facility] Quinton Buckler, FNP       Future Appointments             In 2 months Abram Hoguet, Monalisa Angles, FNP [facility], [facility]

## 2023-12-10 ENCOUNTER — Other Ambulatory Visit: Payer: Self-pay | Admitting: Nurse Practitioner

## 2023-12-10 DIAGNOSIS — I10 Essential (primary) hypertension: Secondary | ICD-10-CM

## 2023-12-10 NOTE — Telephone Encounter (Signed)
 Requested Prescriptions  Pending Prescriptions Disp Refills   amLODipine  (NORVASC ) 5 MG tablet [Pharmacy Med Name: AMLODIPINE  BESYLATE 5 MG TAB] 30 tablet 2    Sig: TAKE 1 TABLET (5 MG TOTAL) BY MOUTH DAILY.     Cardiovascular: Calcium Channel Blockers 2 Failed - 12/10/2023  2:53 PM      Failed - Last BP in normal range    BP Readings from Last 1 Encounters:  08/25/23 (!) 142/79         Passed - Last Heart Rate in normal range    Pulse Readings from Last 1 Encounters:  08/25/23 (!) 107         Passed - Valid encounter within last 6 months    Recent Outpatient Visits           3 months ago Migraine with aura and without status migrainosus, not intractable   [facility] Quinton Buckler, FNP       Future Appointments             In 2 months Abram Hoguet, Monalisa Angles, FNP [facility], [facility]

## 2023-12-25 ENCOUNTER — Encounter: Payer: Self-pay | Admitting: Emergency Medicine

## 2023-12-25 ENCOUNTER — Ambulatory Visit
Admission: EM | Admit: 2023-12-25 | Discharge: 2023-12-25 | Disposition: A | Discharge status: Home / Self Care | Attending: Emergency Medicine | Admitting: Emergency Medicine

## 2023-12-25 DIAGNOSIS — L989 Disorder of the skin and subcutaneous tissue, unspecified: Secondary | ICD-10-CM

## 2023-12-25 DIAGNOSIS — K130 Diseases of lips: Secondary | ICD-10-CM

## 2023-12-25 MED ORDER — DEXAMETHASONE SODIUM PHOSPHATE 10 MG/ML IJ SOLN: 10.0000 mg | Freq: Once | INTRAMUSCULAR | Status: DC

## 2023-12-25 MED ORDER — PREDNISONE 20 MG PO TABS: 40.0000 mg | ORAL_TABLET | Freq: Every day | ORAL | 0 refills | Status: DC

## 2023-12-25 MED ORDER — TRIAMCINOLONE ACETONIDE 0.1 % EX CREA: 1.0000 | TOPICAL_CREAM | Freq: Two times a day (BID) | CUTANEOUS | 0 refills | Status: DC

## 2023-12-25 NOTE — ED Provider Notes (Signed)
 Shannon Clayton    CSN: [id number] Arrival date & time: 12/25/23  9162      History   Chief Complaint Chief Complaint  Patient presents with   Rash    HPI Shannon Clayton is a 48 y.o. female.   Patient presents for evaluation of dryness and erythematous uncomfortable rash present to the upper and lower lip beginning 2 days ago.  Endorses change in the scent of her lip scrub prior to symptoms beginning.  Denies pruritus, drainage, fever or chills.  Able to tolerate food and liquids.  Has attempted use of Aquaphor, Vaseline and Chapstick with no improvement.  Patient concerned with lesion present between the eyes for 3 months.  Has not increased in size.  Denies drainage, itching, fever or chills.  Past Medical History:  Diagnosis Date   Depression    Frequent headaches    Hypertension    Migraine headache with aura     Patient Active Problem List   Diagnosis Date Noted   Adenomatous polyp of colon 03/19/2023   Hyperlipidemia 07/10/2022   Mild episode of recurrent major depressive disorder (HCC) 11/03/2021   Hip pain 10/03/2021   ASCUS with positive high risk HPV cervical 07/27/2019   Elevated liver enzymes 06/23/2019   Encounter for screening colonoscopy 06/23/2019   Prediabetes 06/09/2019   Vitamin D  deficiency 06/09/2019   Migraine with aura and without status migrainosus, not intractable 05/28/2019   Essential hypertension 05/28/2019   Anxiety and depression 05/28/2019   Morbid obesity (HCC) 05/10/2014   Irregular periods 05/10/2014   Anxiety 11/21/2012    Past Surgical History:  Procedure Laterality Date   CESAREAN SECTION  2006   COLONOSCOPY WITH PROPOFOL  N/A 03/19/2023   Procedure: COLONOSCOPY WITH PROPOFOL ;  Surgeon: Therisa Bi, MD;  [facility];  Service: Gastroenterology;  Laterality: N/A;  1ST CASE - PATIENT AWARE   ear pinning  1984   POLYPECTOMY  03/19/2023   Procedure: POLYPECTOMY;  Surgeon: Therisa Bi, MD;  Location: [facility]  ENDOSCOPY;  Service: Gastroenterology;;   TONSILLECTOMY AND ADENOIDECTOMY  2011    OB History     Gravida  1   Para  1   Term  1   Preterm      AB      Living  1      SAB      IAB      Ectopic      Multiple      Live Births  1            Home Medications    Prior to Admission medications   Medication Sig Start Date End Date Taking? Authorizing Provider  predniSONE  (DELTASONE ) 20 MG tablet Take 2 tablets (40 mg total) by mouth daily. 12/25/23  Yes Meda Dudzinski R, NP  triamcinolone cream (KENALOG) 0.1 % Apply 1 Application topically 2 (two) times daily. 12/25/23  Yes Karlisa Gaubert R, NP  amLODipine  (NORVASC ) 5 MG tablet TAKE 1 TABLET (5 MG TOTAL) BY MOUTH DAILY. 12/10/23   Pender, Julie F, FNP  benzonatate  (TESSALON ) 100 MG capsule Take 1-2 capsules (100-200 mg total) by mouth 3 (three) times daily as needed. 08/31/23   Burnette, Zeriyah M, PA-C  busPIRone  (BUSPAR ) 5 MG tablet TAKE 1 TABLET BY MOUTH THREE TIMES A DAY AS NEEDED 10/14/23   Pender, Julie F, FNP  butalbital -acetaminophen-caffeine  (FIORICET) 50-325-40 MG tablet Take 1-2 tablets by mouth daily as needed for headache. Limit use no more 2x per week 10/03/21  Gareth Mliss FALCON, FNP  co-enzyme Q-10 30 MG capsule Take 30 mg by mouth 3 (three) times daily.    [provider]  escitalopram  (LEXAPRO ) 20 MG tablet TAKE 1 TABLET BY MOUTH EVERY DAY 12/02/23   Pender, Julie F, FNP  ipratropium (ATROVENT ) 0.03 % nasal spray Place 2 sprays into both nostrils every 12 (twelve) hours. 10/27/22   Gladis Elsie BROCKS, PA-C  losartan  (COZAAR ) 25 MG tablet TAKE 1 TABLET (25 MG TOTAL) BY MOUTH DAILY. 10/14/23   Pender, Julie F, FNP  MAGNESIUM  PO Take by mouth.    [provider]  meloxicam (MOBIC) 15 MG tablet Take 15 mg by mouth daily. 12/14/22   [provider]  minoxidil  (LONITEN ) 2.5 MG tablet TAKE 1 TABLET BY MOUTH EVERY DAY 09/07/23   Pender, Julie F, FNP  Omega-3 Fatty Acids (FISH OIL OMEGA-3 PO) Take  by mouth.    [provider]  oseltamivir  (TAMIFLU ) 75 MG capsule Take 1 capsule (75 mg total) by mouth every 12 (twelve) hours. 08/25/23   Teresa Shelba SAUNDERS, NP  promethazine -dextromethorphan (PROMETHAZINE -DM) 6.25-15 MG/5ML syrup Take 5 mLs by mouth 4 (four) times daily as needed. 08/31/23   Vivienne Delon HERO, PA-C    Family History Family History  Problem Relation Age of Onset   Cancer Mother        MM   Arthritis Mother    Hyperlipidemia Mother    Hypertension Mother    Anxiety disorder Mother    Arthritis Father    Cancer Maternal Grandmother        lymphoma 2x   Hypertension Maternal Grandmother    Hypertension Maternal Grandfather    Diabetes Maternal Grandfather    Cancer Paternal Grandfather        skin cancer    Breast cancer Neg Hx     Social History Social History   Tobacco Use   Smoking status: Never   Smokeless tobacco: Never  Vaping Use   Vaping status: Never Used  Substance Use Topics   Alcohol use: Yes   Drug use: No     Allergies   Sumatriptan   Review of Systems Review of Systems  Skin:  Positive for rash.     Physical Exam Triage Vital Signs ED Triage Vitals  Encounter Vitals Group     BP 12/25/23 0901 (!) 140/90     Girls Systolic BP Percentile --      Girls Diastolic BP Percentile --      Boys Systolic BP Percentile --      Boys Diastolic BP Percentile --      Pulse Rate 12/25/23 0901 86     Resp 12/25/23 0901 18     Temp 12/25/23 0901 98.3 F (36.8 C)     Temp Source 12/25/23 0901 Oral     SpO2 12/25/23 0901 98 %     Weight --      Height --      Head Circumference --      Peak Flow --      Pain Score 12/25/23 0855 0     Pain Loc --      Pain Education --      Exclude from Growth Chart --    No data found.  Updated Vital Signs BP (!) 140/90 (BP Location: Left Arm)   Pulse 86   Temp 98.3 F (36.8 C) (Oral)   Resp 18   LMP 12/14/2023   SpO2 98%   Visual Acuity Right  Eye Distance:   Left Eye Distance:    Bilateral Distance:    Right Eye Near:   Left Eye Near:    Bilateral Near:     Physical Exam Constitutional:      Appearance: Normal appearance.   Eyes:     Extraocular Movements: Extraocular movements intact.   Pulmonary:     Effort: Pulmonary effort is normal.   Skin:    Comments: 1 x 2 cm seborrheic keratosis present to the center of the face between the eyes, nondraining, nontender, no erythema  Erythematous papular rash present over the patient's upper and lower lips, nondraining, nontender   Neurological:     Mental Status: She is alert and oriented to person, place, and time. Mental status is at baseline.      UC Treatments / Results  Labs (all labs ordered are listed, but only abnormal results are displayed) Labs Reviewed - No data to display  EKG   Radiology No results found.  Procedures Procedures (including critical care time)  Medications Ordered in UC Medications  dexamethasone (DECADRON) injection 10 mg (has no administration in time range)    Initial Impression / Assessment and Plan / UC Course  I have reviewed the triage vital signs and the nursing notes.  Pertinent labs & imaging results that were available during my care of the patient were reviewed by me and considered in my medical decision making (see chart for details).  Rash on lips, facial lesion  Appears to be dermatitis, advised against continued use of lip scrub, Decadron IM given and prescribed prednisone  for home use, for lesion to the face most likely benign, no signs of infection on exam and low suspicion for infection based on timeline of symptoms, prescribed Kenalog cream for treatment, recommended supportive care and advised follow-up with primary doctor for further evaluation if symptoms persist Final Clinical Impressions(s) / UC Diagnoses   Final diagnoses:  Rash on lips  Facial lesion     Discharge Instructions      Rash appears inflammatory without cellulitis  infection most likely reelection to your new lip prescription therefore changed back to to a different scent  Given an injection of steroids to help reduce symptoms more quickly, daily to see improvement within an hour  Begin prednisone  every morning with food for 5 days to continue the above process  For lesion to the center of the face most likely not bacterial due to timeline of symptoms, may apply triamcinolone cream twice daily  If symptoms continue to persist or worsen please follow-up with urgent care or your primary doctor   ED Prescriptions     Medication Sig Dispense Auth. Provider   predniSONE  (DELTASONE ) 20 MG tablet Take 2 tablets (40 mg total) by mouth daily. 10 tablet Iliany Losier R, NP   triamcinolone cream (KENALOG) 0.1 % Apply 1 Application topically 2 (two) times daily. 30 g Teresa Shelba SAUNDERS, NP      PDMP not reviewed this encounter.   Teresa Shelba SAUNDERS, [place] 12/25/23 [phone]

## 2023-12-25 NOTE — Discharge Instructions (Signed)
 Rash appears inflammatory without cellulitis infection most likely reelection to your new lip prescription therefore changed back to to a different scent  Given an injection of steroids to help reduce symptoms more quickly, daily to see improvement within an hour  Begin prednisone  every morning with food for 5 days to continue the above process  For lesion to the center of the face most likely not bacterial due to timeline of symptoms, may apply triamcinolone cream twice daily  If symptoms continue to persist or worsen please follow-up with urgent care or your primary doctor

## 2023-12-25 NOTE — ED Triage Notes (Signed)
 Patient reports rash on lips x 2 days. Using Vaseline and chap stick. Also reports rash between eyes for 3 months. Denies pain at this time. SABRA

## 2024-01-05 ENCOUNTER — Encounter: Payer: Self-pay | Admitting: Nurse Practitioner

## 2024-01-07 ENCOUNTER — Other Ambulatory Visit: Payer: Self-pay | Admitting: Nurse Practitioner

## 2024-01-07 DIAGNOSIS — I10 Essential (primary) hypertension: Secondary | ICD-10-CM

## 2024-01-07 DIAGNOSIS — F419 Anxiety disorder, unspecified: Secondary | ICD-10-CM

## 2024-01-11 NOTE — Telephone Encounter (Signed)
 Requested Prescriptions  Pending Prescriptions Disp Refills   busPIRone  (BUSPAR ) 5 MG tablet [Pharmacy Med Name: BUSPIRONE  HCL 5 MG TABLET] 90 tablet 2    Sig: TAKE 1 TABLET BY MOUTH THREE TIMES A DAY AS NEEDED     Psychiatry: Anxiolytics/Hypnotics - Non-controlled Passed - 01/11/2024 10:44 AM      Passed - Valid encounter within last 12 months    Recent Outpatient Visits           5 months ago Migraine with aura and without status migrainosus, not intractable   [facility] Gareth Mliss FALCON, FNP       Future Appointments             In 1 month Gareth, Mliss FALCON, FNP [facility]             losartan  (COZAAR ) 25 MG tablet [Pharmacy Med Name: LOSARTAN  POTASSIUM 25 MG TAB] 30 tablet 2    Sig: TAKE 1 TABLET (25 MG TOTAL) BY MOUTH DAILY.     Cardiovascular:  Angiotensin Receptor Blockers Failed - 01/11/2024 10:44 AM      Failed - Last BP in normal range    BP Readings from Last 1 Encounters:  12/25/23 (!) 140/90         Passed - Cr in normal range and within 180 days    Creat  Date Value Ref Range Status  08/13/2023 0.94 0.50 - 0.99 mg/dL Final         Passed - K in normal range and within 180 days    Potassium  Date Value Ref Range Status  08/13/2023 3.9 3.5 - 5.3 mmol/L Final         Passed - Patient is not pregnant      Passed - Valid encounter within last 6 months    Recent Outpatient Visits           5 months ago Migraine with aura and without status migrainosus, not intractable   [facility] Gareth Mliss FALCON, FNP       Future Appointments             In 1 month Gareth, Mliss FALCON, FNP [facility], [facility]

## 2024-02-11 ENCOUNTER — Ambulatory Visit: Payer: BLUE CROSS/BLUE SHIELD | Admitting: Nurse Practitioner

## 2024-02-21 ENCOUNTER — Other Ambulatory Visit: Payer: Self-pay | Admitting: Nurse Practitioner

## 2024-02-21 DIAGNOSIS — F330 Major depressive disorder, recurrent, mild: Secondary | ICD-10-CM

## 2024-02-21 DIAGNOSIS — F419 Anxiety disorder, unspecified: Secondary | ICD-10-CM

## 2024-02-23 NOTE — Telephone Encounter (Signed)
 Requested medication (s) are due for refill today: Yes  Requested medication (s) are on the active medication list: Yes  Last refill:    Future visit scheduled: Yes  Notes to clinic:  See pharmacy requests.    Requested Prescriptions  Pending Prescriptions Disp Refills   escitalopram  (LEXAPRO ) 20 MG tablet [Pharmacy Med Name: ESCITALOPRAM  20 MG TABLET] 90 tablet 1    Sig: TAKE 1 TABLET BY MOUTH EVERY DAY     Psychiatry:  Antidepressants - SSRI Failed - 02/23/2024 10:30 AM      Failed - Valid encounter within last 6 months    Recent Outpatient Visits           6 months ago Migraine with aura and without status migrainosus, not intractable   [facility] Gareth Mliss FALCON, [place]              Passed - Completed PHQ-2 or PHQ-9 in the last 360 days

## 2024-02-24 ENCOUNTER — Other Ambulatory Visit: Payer: Self-pay | Admitting: Nurse Practitioner

## 2024-02-24 DIAGNOSIS — F330 Major depressive disorder, recurrent, mild: Secondary | ICD-10-CM

## 2024-02-24 DIAGNOSIS — L658 Other specified nonscarring hair loss: Secondary | ICD-10-CM

## 2024-02-24 DIAGNOSIS — F419 Anxiety disorder, unspecified: Secondary | ICD-10-CM

## 2024-02-24 NOTE — Telephone Encounter (Signed)
 Requested medications are due for refill today.  yes  Requested medications are on the active medications list.  yes  Last refill. Escitalopram   12/02/2023 #30 2 rf, minoxodil 09/07/2023 #90 1 rf  Future visit scheduled.   no  Notes to clinic.  Pt needs an appt. Refill not delegated.    Requested Prescriptions  Pending Prescriptions Disp Refills   escitalopram  (LEXAPRO ) 20 MG tablet [Pharmacy Med Name: ESCITALOPRAM  20 MG TABLET] 30 tablet 2    Sig: TAKE 1 TABLET BY MOUTH EVERY DAY     Psychiatry:  Antidepressants - SSRI Failed - 02/24/2024  5:44 PM      Failed - Valid encounter within last 6 months    Recent Outpatient Visits           6 months ago Migraine with aura and without status migrainosus, not intractable   [facility] Gareth Mliss FALCON, FNP              Passed - Completed PHQ-2 or PHQ-9 in the last 360 days       minoxidil  (LONITEN ) 2.5 MG tablet [Pharmacy Med Name: MINOXIDIL  2.5 MG TABLET] 30 tablet 5    Sig: TAKE 1 TABLET BY MOUTH EVERY DAY     Not Delegated - Cardiovascular: Vasodilators - minoxidil  Failed - 02/24/2024  5:44 PM      Failed - This refill cannot be delegated      Failed - Last BP in normal range    BP Readings from Last 1 Encounters:  12/25/23 (!) 140/90         Failed - Valid encounter within last 3 months    Recent Outpatient Visits           6 months ago Migraine with aura and without status migrainosus, not intractable   [facility] Gareth Mliss FALCON, [place]              Passed - Cl in normal range and within 360 days    Chloride  Date Value Ref Range Status  08/13/2023 103 98 - 110 mmol/L Final         Passed - K in normal range and within 360 days    Potassium  Date Value Ref Range Status  08/13/2023 3.9 3.5 - 5.3 mmol/L Final         Passed - Na in normal range and within 360 days    Sodium  Date Value Ref Range Status  08/13/2023 136 135 - 146 mmol/L Final          Passed - Last Heart Rate in normal range    Pulse Readings from Last 1 Encounters:  12/25/23 86         Passed - Weight completed in the last 12 months    Wt Readings from Last 1 Encounters:  08/13/23 (!) 343 lb (155.6 kg)

## 2024-02-25 NOTE — Telephone Encounter (Signed)
 Pt sch'd appt for Sept

## 2024-02-25 NOTE — Telephone Encounter (Signed)
 Needs appt for refills.

## 2024-03-08 ENCOUNTER — Ambulatory Visit: Admitting: Nurse Practitioner

## 2024-03-10 ENCOUNTER — Other Ambulatory Visit: Payer: Self-pay | Admitting: Nurse Practitioner

## 2024-03-10 DIAGNOSIS — I10 Essential (primary) hypertension: Secondary | ICD-10-CM

## 2024-03-22 ENCOUNTER — Other Ambulatory Visit: Payer: Self-pay | Admitting: Nurse Practitioner

## 2024-03-22 DIAGNOSIS — F419 Anxiety disorder, unspecified: Secondary | ICD-10-CM

## 2024-03-22 DIAGNOSIS — F330 Major depressive disorder, recurrent, mild: Secondary | ICD-10-CM

## 2024-03-23 ENCOUNTER — Ambulatory Visit: Admitting: Nurse Practitioner

## 2024-03-23 NOTE — Telephone Encounter (Signed)
 Requested medications are due for refill today.  yes  Requested medications are on the active medications list.  yes  Last refill. 02/25/2024 #30 0 rf  Future visit scheduled.   yes  Notes to clinic.  Last refill was a courtesy refill. Pt has cancelled recent appts.    Requested Prescriptions  Pending Prescriptions Disp Refills   escitalopram  (LEXAPRO ) 20 MG tablet [Pharmacy Med Name: ESCITALOPRAM  20 MG TABLET] 30 tablet 0    Sig: TAKE 1 TABLET BY MOUTH EVERY DAY     Psychiatry:  Antidepressants - SSRI Failed - 03/23/2024 11:25 AM      Failed - Valid encounter within last 6 months    Recent Outpatient Visits           7 months ago Migraine with aura and without status migrainosus, not intractable   [facility] Gareth Mliss [place], [place]              Passed - Completed PHQ-2 or PHQ-9 in the last 360 days

## 2024-03-23 NOTE — Progress Notes (Deleted)
   There were no vitals taken for this visit.   Subjective:    Patient ID: Shannon Clayton, female    DOB: [birth year], 48 y.o.   MRN: [id number]  HPI: Shannon Clayton is a 48 y.o. female presenting today for a medication refill.            05/13/2023    2:49 PM 11/27/2022    3:39 PM 11/09/2022    3:38 PM  Depression screen PHQ 2/9  Decreased Interest 0 0 0  Down, Depressed, Hopeless 0 0 0  PHQ - 2 Score 0 0 0  Altered sleeping 0  0  Tired, decreased energy 0  0  Change in appetite 0  0  Feeling bad or failure about yourself  0  0  Trouble concentrating 0  0  Moving slowly or fidgety/restless 0  0  Suicidal thoughts 0  0  PHQ-9 Score 0  0  Difficult doing work/chores Not difficult at all  Not difficult at all    Relevant past medical, surgical, family and social history reviewed and updated as indicated. Interim medical history since our last visit reviewed. Allergies and medications reviewed and updated.  Review of Systems  Per HPI unless specifically indicated above     Objective:     There were no vitals taken for this visit.  {Vitals History (Optional):23777} Wt Readings from Last 3 Encounters:  08/13/23 (!) 343 lb (155.6 kg)  05/13/23 (!) 334 lb 6.4 oz (151.7 kg)  03/19/23 (!) 335 lb 11.1 oz (152.3 kg)    Physical Exam   Results for orders placed or performed during the hospital encounter of 08/25/23  POC Covid19/Flu A&B Antigen   Collection Time: 08/25/23  9:08 AM  Result Value Ref Range   Influenza A Antigen, POC Positive (A)    Influenza B Antigen, POC Negative    Covid Antigen, POC Negative    {Labs (Optional):23779}       Assessment & Plan:   Problem List Items Addressed This Visit   None    Assessment and Plan         Follow up plan: No follow-ups on file.

## 2024-04-07 ENCOUNTER — Other Ambulatory Visit: Payer: Self-pay | Admitting: Nurse Practitioner

## 2024-04-07 DIAGNOSIS — F419 Anxiety disorder, unspecified: Secondary | ICD-10-CM

## 2024-04-07 NOTE — Telephone Encounter (Signed)
 Too soon for refill.  Requested Prescriptions  Pending Prescriptions Disp Refills   busPIRone  (BUSPAR ) 5 MG tablet [Pharmacy Med Name: BUSPIRONE  HCL 5 MG TABLET] 90 tablet 2    Sig: TAKE 1 TABLET BY MOUTH THREE TIMES A DAY AS NEEDED     Psychiatry: Anxiolytics/Hypnotics - Non-controlled Passed - 04/07/2024  3:43 PM      Passed - Valid encounter within last 12 months    Recent Outpatient Visits           7 months ago Migraine with aura and without status migrainosus, not intractable   [facility] Gareth Mliss [place], [place]

## 2024-04-12 ENCOUNTER — Ambulatory Visit: Admitting: Nurse Practitioner

## 2024-04-21 ENCOUNTER — Other Ambulatory Visit: Payer: Self-pay | Admitting: Nurse Practitioner

## 2024-04-21 DIAGNOSIS — F419 Anxiety disorder, unspecified: Secondary | ICD-10-CM

## 2024-04-21 DIAGNOSIS — F330 Major depressive disorder, recurrent, mild: Secondary | ICD-10-CM

## 2024-04-24 NOTE — Telephone Encounter (Signed)
 Requested Prescriptions  Pending Prescriptions Disp Refills   escitalopram  (LEXAPRO ) 20 MG tablet [Pharmacy Med Name: ESCITALOPRAM  20 MG TABLET] 90 tablet 0    Sig: TAKE 1 TABLET BY MOUTH EVERY DAY     Psychiatry:  Antidepressants - SSRI Failed - 04/24/2024 10:40 AM      Failed - Valid encounter within last 6 months    Recent Outpatient Visits           8 months ago Migraine with aura and without status migrainosus, not intractable   [facility] Gareth Mliss [place], [place]              Passed - Completed PHQ-2 or PHQ-9 in the last 360 days

## 2024-05-24 ENCOUNTER — Ambulatory Visit: Admitting: Nurse Practitioner

## 2024-05-25 ENCOUNTER — Ambulatory Visit: Admitting: Nurse Practitioner

## 2024-06-03 ENCOUNTER — Ambulatory Visit: Payer: Self-pay

## 2024-06-03 ENCOUNTER — Ambulatory Visit
Admission: EM | Admit: 2024-06-03 | Discharge: 2024-06-03 | Disposition: A | Discharge status: Home / Self Care | Attending: Nurse Practitioner | Admitting: Nurse Practitioner

## 2024-06-03 ENCOUNTER — Other Ambulatory Visit: Payer: Self-pay

## 2024-06-03 DIAGNOSIS — L03115 Cellulitis of right lower limb: Secondary | ICD-10-CM

## 2024-06-03 MED ORDER — CEPHALEXIN 500 MG PO CAPS: 500.0000 mg | ORAL_CAPSULE | Freq: Three times a day (TID) | ORAL | 0 refills | Status: AC

## 2024-06-03 NOTE — ED Triage Notes (Signed)
 Pt being seen in UC for R foot pain that was noticed on Sunday. Pt reports noticing knot on foot. Pt denies known injury and insect bite to area. Pt denies fevers. Pt denies otc medication.

## 2024-06-03 NOTE — Discharge Instructions (Addendum)
 You were seen today for an area of redness and firmness on your right ankle that is concerning for early skin infection (cellulitis). You were prescribed an antibiotic called Keflex to take three times daily for seven days to treat possible infection. Please take the full course even if the area starts to look better. The borders of the redness were marked so you can monitor for any spreading--check the area frequently. Keep your leg elevated above heart level when resting to help reduce swelling and discomfort. You may use warm compresses to the area for comfort and take Tylenol or ibuprofen as needed for pain.  Follow up with your primary care provider if the redness, firmness, or pain is not clearly improving within 48 hours, or if you have ongoing concerns. Seek emergency care immediately if the redness spreads beyond the marked outline, if the area becomes rapidly more painful or swollen, if pus or drainage develops, or if you develop fever, chills, feeling ill, or any sudden worsening of symptoms.

## 2024-06-03 NOTE — ED Provider Notes (Signed)
 Shannon Clayton    CSN: [id number] Arrival date & time: 06/03/24  9187      History   Chief Complaint Chief Complaint  Patient presents with   Foot Pain    HPI Shannon Clayton is a 48 y.o. female.   The patient complains of pain and swelling localized to the anterior aspect of the right ankle without radiation. Symptoms began gradually several days ago and have continued since onset. The pain is described as aching and throbbing in quality and is currently mild in severity. It is worsened with movement, weight-bearing, and palpation, and improves with rest and immobilization. The patient denies any known injury or trauma. She also denies numbness, tingling, weakness, loss of sensation, loss of range of motion, inability to bear weight, fever, or other systemic symptoms.  The following sections of the patient's history were reviewed and updated as appropriate: allergies, current medications, past family history, past medical history, past social history, past surgical history, and problem list.     Past Medical History:  Diagnosis Date   Depression    Frequent headaches    Hypertension    Migraine headache with aura     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/03/2021   Hip pain 10/03/2021   ASCUS with positive high risk HPV cervical 07/27/2019   Elevated liver enzymes 06/23/2019   Encounter for screening colonoscopy 06/23/2019   Prediabetes 06/09/2019   Vitamin D  deficiency 06/09/2019   Migraine with aura and without status migrainosus, not intractable 05/28/2019   Essential hypertension 05/28/2019   Anxiety and depression 05/28/2019   Morbid obesity (HCC) 05/10/2014   Irregular periods 05/10/2014   Anxiety 11/21/2012    Past Surgical History:  Procedure Laterality Date   CESAREAN SECTION  2006   COLONOSCOPY WITH  PROPOFOL  N/A 03/19/2023   Procedure: COLONOSCOPY WITH PROPOFOL ;  Surgeon: Therisa Bi, MD;  Location: [facility] ENDOSCOPY;  Service: Gastroenterology;  Laterality: N/A;  1ST CASE - PATIENT AWARE   ear pinning  1984   POLYPECTOMY  03/19/2023   Procedure: POLYPECTOMY;  Surgeon: Therisa Bi, MD;  Location: [facility] ENDOSCOPY;  Service: Gastroenterology;;   TONSILLECTOMY AND ADENOIDECTOMY  2011    OB History     Gravida  1   Para  1   Term  1   Preterm      AB      Living  1      SAB      IAB      Ectopic      Multiple      Live Births  1            Home Medications    Prior to Admission medications   Medication Sig Start Date End Date Taking? Authorizing Provider  cephALEXin (KEFLEX) 500 MG capsule Take 1 capsule (500 mg total) by mouth 3 (three) times daily for 7 days. 06/03/24 06/10/24 Yes Seena Face, FNP  amLODipine  (NORVASC ) 5 MG tablet TAKE 1 TABLET (5 MG TOTAL) BY MOUTH DAILY. 03/10/24   Pender, Julie F, FNP  benzonatate  (TESSALON ) 100 MG capsule Take 1-2 capsules (100-200 mg total) by mouth 3 (three) times daily as needed. Patient not taking: Reported on 06/03/2024 08/31/23   Burnette, Kailynne M, PA-C  busPIRone  (BUSPAR ) 5 MG tablet TAKE 1 TABLET BY MOUTH THREE TIMES A DAY AS NEEDED 01/11/24  Gareth Mliss [place], FNP  butalbital -acetaminophen-caffeine  (FIORICET) 50-325-40 MG tablet Take 1-2 tablets by mouth daily as needed for headache. Limit use no more 2x per week 10/03/21   Pender, Julie F, FNP  co-enzyme Q-10 30 MG capsule Take 30 mg by mouth 3 (three) times daily.    [provider]  escitalopram  (LEXAPRO ) 20 MG tablet TAKE 1 TABLET BY MOUTH EVERY DAY 04/24/24   Pender, Julie F, FNP  ipratropium (ATROVENT ) 0.03 % nasal spray Place 2 sprays into both nostrils every 12 (twelve) hours. 10/27/22   Gladis Elsie BROCKS, PA-C  losartan  (COZAAR ) 25 MG tablet TAKE 1 TABLET (25 MG TOTAL) BY MOUTH DAILY. 01/11/24   Gareth Mliss [place], FNP  MAGNESIUM  PO Take by mouth.     [provider]  meloxicam (MOBIC) 15 MG tablet Take 15 mg by mouth daily. 12/14/22   [provider]  minoxidil  (LONITEN ) 2.5 MG tablet TAKE 1 TABLET BY MOUTH EVERY DAY 02/25/24   Pender, Julie F, FNP  Omega-3 Fatty Acids (FISH OIL OMEGA-3 PO) Take by mouth.    [provider]  oseltamivir  (TAMIFLU ) 75 MG capsule Take 1 capsule (75 mg total) by mouth every 12 (twelve) hours. Patient not taking: Reported on 06/03/2024 08/25/23   Teresa Shelba SAUNDERS, NP  predniSONE  (DELTASONE ) 20 MG tablet Take 2 tablets (40 mg total) by mouth daily. Patient not taking: Reported on 06/03/2024 12/25/23   Teresa Shelba SAUNDERS, NP  promethazine -dextromethorphan (PROMETHAZINE -DM) 6.25-15 MG/5ML syrup Take 5 mLs by mouth 4 (four) times daily as needed. Patient not taking: Reported on 06/03/2024 08/31/23   Burnette, Kohana M, PA-C  triamcinolone  cream (KENALOG ) 0.1 % Apply 1 Application topically 2 (two) times daily. Patient not taking: Reported on 06/03/2024 12/25/23   Teresa Shelba SAUNDERS, NP    Family History Family History  Problem Relation Age of Onset   Cancer Mother        MM   Arthritis Mother    Hyperlipidemia Mother    Hypertension Mother    Anxiety disorder Mother    Arthritis Father    Cancer Maternal Grandmother        lymphoma 2x   Hypertension Maternal Grandmother    Hypertension Maternal Grandfather    Diabetes Maternal Grandfather    Cancer Paternal Grandfather        skin cancer    Breast cancer Neg Hx     Social History Social History   Tobacco Use   Smoking status: Never   Smokeless tobacco: Never  Vaping Use   Vaping status: Never Used  Substance Use Topics   Alcohol use: Yes   Drug use: No     Allergies   Sumatriptan   Review of Systems Review of Systems  Constitutional:  Negative for chills and fever.  Gastrointestinal:  Negative for nausea and vomiting.  Musculoskeletal:  Negative for myalgias.  Skin:  Positive for wound. Negative for rash.   All other systems reviewed and are negative.    Physical Exam Triage Vital Signs ED Triage Vitals  Encounter Vitals Group     BP 06/03/24 0821 (!) 149/91     Girls Systolic BP Percentile --      Girls Diastolic BP Percentile --      Boys Systolic BP Percentile --      Boys Diastolic BP Percentile --      Pulse Rate 06/03/24 0821 90     Resp 06/03/24 0821 18     Temp 06/03/24 0821 98 F (  36.7 C)     Temp src --      SpO2 06/03/24 0821 97 %     Weight --      Height --      Head Circumference --      Peak Flow --      Pain Score 06/03/24 0825 2     Pain Loc --      Pain Education --      Exclude from Growth Chart --    No data found.  Updated Vital Signs BP (!) 149/91   Pulse 90   Temp 98 F (36.7 C)   Resp 18   LMP 05/24/2024 (Exact Date)   SpO2 97%   Visual Acuity Right Eye Distance:   Left Eye Distance:   Bilateral Distance:    Right Eye Near:   Left Eye Near:    Bilateral Near:     Physical Exam Vitals reviewed.  Constitutional:      General: She is awake. She is not in acute distress.    Appearance: Normal appearance. She is well-developed. She is not ill-appearing, toxic-appearing or diaphoretic.  HENT:     Head: Normocephalic.     Right Ear: Hearing normal.     Left Ear: Hearing normal.     Nose: Nose normal.     Mouth/Throat:     Mouth: Mucous membranes are moist.  Eyes:     General: Vision grossly intact.     Conjunctiva/sclera: Conjunctivae normal.  Cardiovascular:     Rate and Rhythm: Normal rate and regular rhythm.     Heart sounds: Normal heart sounds.  Pulmonary:     Effort: Pulmonary effort is normal.     Breath sounds: Normal breath sounds and air entry.  Musculoskeletal:        General: Normal range of motion.     Cervical back: Normal range of motion and neck supple.  Skin:    General: Skin is warm and dry.     Findings: Wound present.     Comments: A localized area of erythema is present over the anterior aspect of the  right distal lower leg just proximal to the ankle joint. The area appears mildly edematous and is warm to palpation compared to surrounding tissue. There is no visible fluctuance, open wounds, drainage, or circular lesions. The skin remains intact without evidence of ulceration or breakdown, and no lymphangitic streaking is observed. Range of motion of the foot and ankle is intact, though movement causes mild discomfort. Distal pulses are strong, and sensation is intact throughout the foot, ankle, and toes.  Neurological:     General: No focal deficit present.     Mental Status: She is alert and oriented to person, place, and time.  Psychiatric:        Speech: Speech normal.        Behavior: Behavior is cooperative.         UC Treatments / Results  Labs (all labs ordered are listed, but only abnormal results are displayed) Labs Reviewed - No data to display  EKG   Radiology No results found.  Procedures Procedures (including critical care time)  Medications Ordered in UC Medications - No data to display  Initial Impression / Assessment and Plan / UC Course  I have reviewed the triage vital signs and the nursing notes.  Pertinent labs & imaging results that were available during my care of the patient were reviewed by me and considered in my medical  decision making (see chart for details).     The patient presents with a localized erythematous area with a firm base on the anterior aspect of the right ankle without fluctuance, drainage, known injury, or insect bite, and denies fever or other systemic symptoms. Examination findings are most consistent with early cellulitis, with consideration given to dermatitis or a developing abscess. The borders of erythema were marked to allow for monitoring of progression. Empiric treatment with Keflex three times daily for seven days was initiated. The patient was advised to keep the affected extremity elevated and monitor closely for  changes. Follow-up with the primary care provider was recommended if there is no improvement within 48 hours. Emergency evaluation was advised for worsening redness, spreading beyond marked borders, increasing pain, fever, development of fluctuance, or any signs of systemic illness.  Today's evaluation has revealed no signs of a dangerous process. Discussed diagnosis with patient and/or guardian. Patient and/or guardian aware of their diagnosis, possible red flag symptoms to watch out for and need for close follow up. Patient and/or guardian understands verbal and written discharge instructions. Patient and/or guardian comfortable with plan and disposition.  Patient and/or guardian has a clear mental status at this time, good insight into illness (after discussion and teaching) and has clear judgment to make decisions regarding their care  Documentation was completed with the aid of voice recognition software. Transcription may contain typographical errors.   Final Clinical Impressions(s) / UC Diagnoses   Final diagnoses:  Cellulitis of right ankle     Discharge Instructions      You were seen today for an area of redness and firmness on your right ankle that is concerning for early skin infection (cellulitis). You were prescribed an antibiotic called Keflex to take three times daily for seven days to treat possible infection. Please take the full course even if the area starts to look better. The borders of the redness were marked so you can monitor for any spreading--check the area frequently. Keep your leg elevated above heart level when resting to help reduce swelling and discomfort. You may use warm compresses to the area for comfort and take Tylenol or ibuprofen as needed for pain.  Follow up with your primary care provider if the redness, firmness, or pain is not clearly improving within 48 hours, or if you have ongoing concerns. Seek emergency care immediately if the redness spreads beyond  the marked outline, if the area becomes rapidly more painful or swollen, if pus or drainage develops, or if you develop fever, chills, feeling ill, or any sudden worsening of symptoms.     ED Prescriptions     Medication Sig Dispense Auth. Provider   cephALEXin (KEFLEX) 500 MG capsule Take 1 capsule (500 mg total) by mouth 3 (three) times daily for 7 days. 21 capsule Iola Lukes, FNP      PDMP not reviewed this encounter.   Iola Lukes, [place] 06/03/24 [phone]

## 2024-06-05 ENCOUNTER — Encounter: Payer: Self-pay | Admitting: Emergency Medicine

## 2024-06-05 ENCOUNTER — Telehealth: Payer: Self-pay | Admitting: Emergency Medicine

## 2024-06-05 ENCOUNTER — Telehealth: Admitting: Family Medicine

## 2024-06-05 ENCOUNTER — Ambulatory Visit
Admission: EM | Admit: 2024-06-05 | Discharge: 2024-06-05 | Disposition: A | Discharge status: Home / Self Care | Attending: Emergency Medicine | Admitting: Emergency Medicine

## 2024-06-05 DIAGNOSIS — L03115 Cellulitis of right lower limb: Secondary | ICD-10-CM | POA: Diagnosis not present

## 2024-06-05 DIAGNOSIS — L03119 Cellulitis of unspecified part of limb: Secondary | ICD-10-CM

## 2024-06-05 MED ORDER — FLUCONAZOLE 150 MG PO TABS: 150.0000 mg | ORAL_TABLET | ORAL | 0 refills | Status: DC | PRN

## 2024-06-05 MED ORDER — LEVOFLOXACIN 500 MG PO TABS: 500.0000 mg | ORAL_TABLET | Freq: Every day | ORAL | 0 refills | Status: DC

## 2024-06-05 NOTE — ED Triage Notes (Signed)
 Patient reports right ankle pain x 6 days. Patient was seen for same on 06/03/24. Rates pain 6/10.

## 2024-06-05 NOTE — Telephone Encounter (Signed)
 Patient returned phone call to clinic, feels as if symptoms is worsening therefore instructed patient that she would need to be seen in person to determine the next course of action for treatment, endorses that she will come in for walk-in appointment later today

## 2024-06-05 NOTE — Progress Notes (Signed)
 Because you were seen and given treatment and not improving- you will need to follow back up in person we feel your condition warrants further evaluation and recommend that you be seen in a face-to-face visit.   NOTE: There will be NO CHARGE for this E-Visit   If you are having a true medical emergency, please call 911.

## 2024-06-05 NOTE — ED Provider Notes (Signed)
 [name]    CSN: [id number] Arrival date & time: 06/05/24  1654      History   Chief Complaint Chief Complaint  Patient presents with   Ankle Pain    HPI Shannon Clayton is a 48 y.o. female.   Patient presents for evaluation of worsening erythema and swelling present to the right ankle beginning 8 days ago.  Pain can be felt when walking upstairs.  was evaluated on 06/03/2024 in this urgent care and prescribed cephalexin for treatment of cellulitis, has taken 2 days of medication without improvement.  Has noticed a purple hue to the skin and skin is hot to touch.  Denies fever or drainage.  Past Medical History:  Diagnosis Date   Depression    Frequent headaches    Hypertension    Migraine headache with aura     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/03/2021   Hip pain 10/03/2021   ASCUS with positive high risk HPV cervical 07/27/2019   Elevated liver enzymes 06/23/2019   Encounter for screening colonoscopy 06/23/2019   Prediabetes 06/09/2019   Vitamin D  deficiency 06/09/2019   Migraine with aura and without status migrainosus, not intractable 05/28/2019   Essential hypertension 05/28/2019   Anxiety and depression 05/28/2019   Morbid obesity (HCC) 05/10/2014   Irregular periods 05/10/2014   Anxiety 11/21/2012    Past Surgical History:  Procedure Laterality Date   CESAREAN SECTION  2006   COLONOSCOPY WITH PROPOFOL  N/A 03/19/2023   Procedure: COLONOSCOPY WITH PROPOFOL ;  Surgeon: Therisa Bi, MD;  Location: [facility] ENDOSCOPY;  Service: Gastroenterology;  Laterality: N/A;  1ST CASE - PATIENT AWARE   ear pinning  1984   POLYPECTOMY  03/19/2023   Procedure: POLYPECTOMY;  Surgeon: Therisa Bi, MD;  Location: [facility] ENDOSCOPY;  Service: Gastroenterology;;   TONSILLECTOMY AND ADENOIDECTOMY  2011    OB History      Gravida  1   Para  1   Term  1   Preterm      AB      Living  1      SAB      IAB      Ectopic      Multiple      Live Births  1            Home Medications    Prior to Admission medications   Medication Sig Start Date End Date Taking? Authorizing Provider  fluconazole  (DIFLUCAN ) 150 MG tablet Take 1 tablet (150 mg total) by mouth every three (3) days as needed for up to 2 doses. 06/05/24  Yes Travanti Mcmanus R, NP  levofloxacin (LEVAQUIN) 500 MG tablet Take 1 tablet (500 mg total) by mouth daily. 06/05/24  Yes Jenet Durio R, NP  amLODipine  (NORVASC ) 5 MG tablet TAKE 1 TABLET (5 MG TOTAL) BY MOUTH DAILY. 03/10/24   Pender, Julie F, FNP  benzonatate  (TESSALON ) 100 MG capsule Take 1-2 capsules (100-200 mg total) by mouth 3 (three) times daily as needed. Patient not taking: Reported on 06/03/2024 08/31/23   Burnette, Kynlei M, PA-C  busPIRone  (BUSPAR ) 5 MG tablet TAKE 1 TABLET BY MOUTH THREE TIMES A DAY AS NEEDED 01/11/24   Pender, Julie F, FNP  butalbital -acetaminophen-caffeine  (FIORICET) 50-325-40 MG tablet Take 1-2 tablets by mouth daily as needed for headache. Limit use no more 2x per  week 10/03/21   Pender, Julie F, FNP  cephALEXin (KEFLEX) 500 MG capsule Take 1 capsule (500 mg total) by mouth 3 (three) times daily for 7 days. 06/03/24 06/10/24  Iola Lukes, FNP  co-enzyme Q-10 30 MG capsule Take 30 mg by mouth 3 (three) times daily.    [provider]  escitalopram  (LEXAPRO ) 20 MG tablet TAKE 1 TABLET BY MOUTH EVERY DAY 04/24/24   Pender, Julie F, FNP  ipratropium (ATROVENT ) 0.03 % nasal spray Place 2 sprays into both nostrils every 12 (twelve) hours. 10/27/22   Gladis Elsie BROCKS, PA-C  losartan  (COZAAR ) 25 MG tablet TAKE 1 TABLET (25 MG TOTAL) BY MOUTH DAILY. 01/11/24   Pender, Julie F, FNP  MAGNESIUM  PO Take by mouth.    [provider]  meloxicam (MOBIC) 15 MG tablet Take 15 mg by mouth daily. 12/14/22   [provider]  minoxidil   (LONITEN ) 2.5 MG tablet TAKE 1 TABLET BY MOUTH EVERY DAY 02/25/24   Pender, Julie F, FNP  Omega-3 Fatty Acids (FISH OIL OMEGA-3 PO) Take by mouth.    [provider]  oseltamivir  (TAMIFLU ) 75 MG capsule Take 1 capsule (75 mg total) by mouth every 12 (twelve) hours. Patient not taking: Reported on 06/03/2024 08/25/23   Teresa Shelba SAUNDERS, NP  predniSONE  (DELTASONE ) 20 MG tablet Take 2 tablets (40 mg total) by mouth daily. Patient not taking: Reported on 06/03/2024 12/25/23   Teresa Shelba SAUNDERS, NP  promethazine -dextromethorphan (PROMETHAZINE -DM) 6.25-15 MG/5ML syrup Take 5 mLs by mouth 4 (four) times daily as needed. Patient not taking: Reported on 06/03/2024 08/31/23   Burnette, Ilea M, PA-C  triamcinolone  cream (KENALOG ) 0.1 % Apply 1 Application topically 2 (two) times daily. Patient not taking: Reported on 06/03/2024 12/25/23   Teresa Shelba SAUNDERS, NP    Family History Family History  Problem Relation Age of Onset   Cancer Mother        MM   Arthritis Mother    Hyperlipidemia Mother    Hypertension Mother    Anxiety disorder Mother    Arthritis Father    Cancer Maternal Grandmother        lymphoma 2x   Hypertension Maternal Grandmother    Hypertension Maternal Grandfather    Diabetes Maternal Grandfather    Cancer Paternal Grandfather        skin cancer    Breast cancer Neg Hx     Social History Social History   Tobacco Use   Smoking status: Never   Smokeless tobacco: Never  Vaping Use   Vaping status: Never Used  Substance Use Topics   Alcohol use: Yes   Drug use: No     Allergies   Sumatriptan   Review of Systems Review of Systems   Physical Exam Triage Vital Signs ED Triage Vitals  Encounter Vitals Group     BP 06/05/24 1753 (!) 150/75     Girls Systolic BP Percentile --      Girls Diastolic BP Percentile --      Boys Systolic BP Percentile --      Boys Diastolic BP Percentile --      Pulse Rate 06/05/24 1753 77     Resp 06/05/24 1753 18      Temp 06/05/24 1753 97.7 F (36.5 C)     Temp Source 06/05/24 1753 Oral     SpO2 06/05/24 1753 98 %     Weight --      Height --      Head  Circumference --      Peak Flow --      Pain Score 06/05/24 1755 6     Pain Loc --      Pain Education --      Exclude from Growth Chart --    No data found.  Updated Vital Signs BP (!) 150/75 (BP Location: Left Arm)   Pulse 77   Temp 97.7 F (36.5 C) (Oral)   Resp 18   LMP 05/24/2024 (Exact Date)   SpO2 98%   Visual Acuity Right Eye Distance:   Left Eye Distance:   Bilateral Distance:    Right Eye Near:   Left Eye Near:    Bilateral Near:     Physical Exam Constitutional:      Appearance: Normal appearance.  Eyes:     Extraocular Movements: Extraocular movements intact.  Pulmonary:     Effort: Pulmonary effort is normal.  Neurological:     Mental Status: She is alert and oriented to person, place, and time.      UC Treatments / Results  Labs (all labs ordered are listed, but only abnormal results are displayed) Labs Reviewed - No data to display  EKG   Radiology No results found.  Procedures Procedures (including critical care time)  Medications Ordered in UC Medications - No data to display  Initial Impression / Assessment and Plan / UC Course  I have reviewed the triage vital signs and the nursing notes.  Pertinent labs & imaging results that were available during my care of the patient were reviewed by me and considered in my medical decision making (see chart for details).  Cellulitis of right ankle  Erythema spreading out of border created on 06/03/2024 urgent care visit, swelling also appears to be worsening, nonpitting, discontinued cephalexin and prescribed Levaquin for treatment, recommended nonpharmacological measures of elevation and ice and may use over-the-counter analgesics for any discomfort, given strict precautions that if no improvement seen within 72 hours she will need to seek out  reevaluation and at any point if she experiences fever she is to go to the nearest emergency department Final Clinical Impressions(s) / UC Diagnoses   Final diagnoses:  Cellulitis of right ankle     Discharge Instructions      Today you are evaluated for the redness and swelling to your ankle which had agree has worsened since your prior visit  Begin Levaquin once a day for 7 days for treatment, stop use of cephalexin, may use Diflucan  if you begin to have vaginal itching or irritation  May take Tylenol and or Motrin as needed for any discomfort  May elevate whenever sitting and lying to help reduce swelling  May apply ice over the affected area in 10 to 15-minute intervals for comfort  May continue activity if tolerable  If no improvement seen by 72 hours please return for reevaluation, at any point if you begin to experience a fever please go to the nearest emergency department   ED Prescriptions     Medication Sig Dispense Auth. Provider   levofloxacin (LEVAQUIN) 500 MG tablet Take 1 tablet (500 mg total) by mouth daily. 7 tablet Justinian Miano R, NP   fluconazole  (DIFLUCAN ) 150 MG tablet Take 1 tablet (150 mg total) by mouth every three (3) days as needed for up to 2 doses. 2 tablet Emari Hreha R, NP      PDMP not reviewed this encounter.   Teresa Shelba SAUNDERS, NP 06/05/24 [phone]

## 2024-06-05 NOTE — Discharge Instructions (Signed)
 Today you are evaluated for the redness and swelling to your ankle which had agree has worsened since your prior visit  Begin Levaquin once a day for 7 days for treatment, stop use of cephalexin, may use Diflucan  if you begin to have vaginal itching or irritation  May take Tylenol and or Motrin as needed for any discomfort  May elevate whenever sitting and lying to help reduce swelling  May apply ice over the affected area in 10 to 15-minute intervals for comfort  May continue activity if tolerable  If no improvement seen by 72 hours please return for reevaluation, at any point if you begin to experience a fever please go to the nearest emergency department

## 2024-06-05 NOTE — Telephone Encounter (Signed)
 Patient called clinic endorsing that redness and swelling to the foot has not improved and there is a purple hue and increased swelling, verbalized this to secretary who notified her that a clinical staff member will call her back  with further instructions.    Was evaluated in this urgent care on 06/03/2024, diagnosed with cellulitis and started on cephalexin.  Attempted to call patient back x 1, phone went straight to voicemail and unable to leave message as mailbox is full.

## 2024-06-06 ENCOUNTER — Emergency Department (HOSPITAL_COMMUNITY)

## 2024-06-06 ENCOUNTER — Other Ambulatory Visit: Payer: Self-pay

## 2024-06-06 ENCOUNTER — Encounter (HOSPITAL_COMMUNITY): Payer: Self-pay

## 2024-06-06 ENCOUNTER — Emergency Department (HOSPITAL_COMMUNITY)
Admission: EM | Admit: 2024-06-06 | Discharge: 2024-06-06 | Disposition: A | Discharge status: Home / Self Care | Source: Ambulatory Visit | Attending: Emergency Medicine | Admitting: Emergency Medicine

## 2024-06-06 DIAGNOSIS — L03115 Cellulitis of right lower limb: Secondary | ICD-10-CM | POA: Diagnosis not present

## 2024-06-06 DIAGNOSIS — M79671 Pain in right foot: Secondary | ICD-10-CM | POA: Diagnosis present

## 2024-06-06 DIAGNOSIS — L539 Erythematous condition, unspecified: Secondary | ICD-10-CM | POA: Diagnosis not present

## 2024-06-06 LAB — COMPREHENSIVE METABOLIC PANEL WITH GFR
ALT: 54 U/L — ABNORMAL HIGH (ref 0–44)
AST: 40 U/L (ref 15–41)
Albumin: 4.2 g/dL (ref 3.5–5.0)
Alkaline Phosphatase: 97 U/L (ref 38–126)
Anion gap: 10 (ref 5–15)
BUN: 13 mg/dL (ref 6–20)
CO2: 27 mmol/L (ref 22–32)
Calcium: 9.6 mg/dL (ref 8.9–10.3)
Chloride: 101 mmol/L (ref 98–111)
Creatinine, Ser: 0.84 mg/dL (ref 0.44–1.00)
GFR, Estimated: >60 mL/min (ref >60)
Glucose, Bld: 112 mg/dL — ABNORMAL HIGH (ref 70–99)
Potassium: 4 mmol/L (ref 3.5–5.1)
Sodium: 137 mmol/L (ref 135–145)
Total Bilirubin: 0.3 mg/dL (ref 0.0–1.2)
Total Protein: 7.1 g/dL (ref 6.5–8.1)

## 2024-06-06 LAB — HCG, SERUM, QUALITATIVE: Preg, Serum: NEGATIVE

## 2024-06-06 LAB — CBC WITH DIFFERENTIAL/PLATELET
Abs Immature Granulocytes: 0.03 K/uL (ref 0.00–0.07)
Basophils Absolute: 0.1 K/uL (ref 0.0–0.1)
Basophils Relative: 1 %
Eosinophils Absolute: 0.3 K/uL (ref 0.0–0.5)
Eosinophils Relative: 3 %
HCT: 39.1 % (ref 36.0–46.0)
Hemoglobin: 13.1 g/dL (ref 12.0–15.0)
Immature Granulocytes: 0 %
Lymphocytes Relative: 33 %
Lymphs Abs: 2.9 K/uL (ref 0.7–4.0)
MCH: 29.5 pg (ref 26.0–34.0)
MCHC: 33.5 g/dL (ref 30.0–36.0)
MCV: 88.1 fL (ref 80.0–100.0)
Monocytes Absolute: 0.5 K/uL (ref 0.1–1.0)
Monocytes Relative: 6 %
Neutro Abs: 5.1 K/uL (ref 1.7–7.7)
Neutrophils Relative %: 57 %
Platelets: 343 K/uL (ref 150–400)
RBC: 4.44 MIL/uL (ref 3.87–5.11)
RDW: 13.6 % (ref 11.5–15.5)
WBC: 8.8 K/uL (ref 4.0–10.5)
nRBC: 0 % (ref 0.0–0.2)

## 2024-06-06 LAB — SEDIMENTATION RATE: Sed Rate: 11 mm/h (ref 0–22)

## 2024-06-06 LAB — I-STAT CG4 LACTIC ACID, ED: Lactic Acid, Venous: 0.7 mmol/L (ref 0.5–1.9)

## 2024-06-06 NOTE — ED Triage Notes (Signed)
 Pt presents with redness and swelling to anterior right ankle. She noticed a small lump on her foot and then has progressively become red, swollen and inflamed. The redness has spread past the skin marker placed by urgent care 4-5 days ago. Denies fever/chills.

## 2024-06-06 NOTE — Discharge Instructions (Addendum)
 It was a pleasure meeting with you today.  As we discussed x-ray of your foot shows soft tissue swelling without any bone fractures or involvement of the bone.  Continue prescribed levofloxacin for at least 3 days for full effect of medicine.  Follow-up with primary care for further evaluation of resolution of symptoms and ongoing management.  If you experience worsening pain, fevers, shortness of breath, chest pain, or any other concerning symptoms please return for further evaluation.

## 2024-06-06 NOTE — ED Provider Notes (Signed)
 [place] EMERGENCY DEPARTMENT AT [facility] Provider Note   CSN: [id number] Arrival date & time: 06/06/24  1753     Patient presents with: Wound Check   Shannon Clayton is a 48 y.o. female.   Saw patient initially in triage. Pt complains of swelling and tenderness to the top of right foot that began 11/29.  Previously seen in urgent care, initially given Keflex which she took for 2 days.  When erythema crossed the area that urgent care had marked previously she contacted the office and they switched her to levofloxacin.  She took a total of 2 days of Keflex and has taken only 1 dose of levofloxacin.  She reports tightness of the right foot and tenderness with extension and flexion of the ankle.  She has never experienced something like this in the past.  She denies known bug bite.  Denies drainage or weeping.  She does recall break in skin to the right foot a few days ago secondary to being pulled by her dog.  This may be source of infection.  She denies fevers, shortness of breath,, dizziness, chest pain, or loss of consciousness.  Patient is concerned about the possibility of systemic infection.  The history is provided by the patient.  Wound Check       Prior to Admission medications   Medication Sig Start Date End Date Taking? Authorizing Provider  amLODipine  (NORVASC ) 5 MG tablet TAKE 1 TABLET (5 MG TOTAL) BY MOUTH DAILY. 03/10/24   Pender, Julie F, FNP  benzonatate  (TESSALON ) 100 MG capsule Take 1-2 capsules (100-200 mg total) by mouth 3 (three) times daily as needed. Patient not taking: Reported on 06/03/2024 08/31/23   Burnette, Careli M, PA-C  busPIRone  (BUSPAR ) 5 MG tablet TAKE 1 TABLET BY MOUTH THREE TIMES A DAY AS NEEDED 01/11/24   Pender, Julie F, FNP  butalbital -acetaminophen-caffeine  (FIORICET) 50-325-40 MG tablet Take 1-2 tablets by mouth daily as needed for headache. Limit use no more 2x per week 10/03/21   Pender, Julie F, FNP  cephALEXin (KEFLEX) 500 MG  capsule Take 1 capsule (500 mg total) by mouth 3 (three) times daily for 7 days. 06/03/24 06/10/24  Iola Lukes, FNP  co-enzyme Q-10 30 MG capsule Take 30 mg by mouth 3 (three) times daily.    [provider]  escitalopram  (LEXAPRO ) 20 MG tablet TAKE 1 TABLET BY MOUTH EVERY DAY 04/24/24   Pender, Julie F, FNP  fluconazole  (DIFLUCAN ) 150 MG tablet Take 1 tablet (150 mg total) by mouth every three (3) days as needed for up to 2 doses. 06/05/24   White, Adrienne R, NP  ipratropium (ATROVENT ) 0.03 % nasal spray Place 2 sprays into both nostrils every 12 (twelve) hours. 10/27/22   Gladis Elsie BROCKS, PA-C  levofloxacin (LEVAQUIN) 500 MG tablet Take 1 tablet (500 mg total) by mouth daily. 06/05/24   White, Shelba SAUNDERS, NP  losartan  (COZAAR ) 25 MG tablet TAKE 1 TABLET (25 MG TOTAL) BY MOUTH DAILY. 01/11/24   Gareth Mliss FALCON, FNP  MAGNESIUM  PO Take by mouth.    [provider]  meloxicam (MOBIC) 15 MG tablet Take 15 mg by mouth daily. 12/14/22   [provider]  minoxidil  (LONITEN ) 2.5 MG tablet TAKE 1 TABLET BY MOUTH EVERY DAY 02/25/24   Pender, Julie F, FNP  Omega-3 Fatty Acids (FISH OIL OMEGA-3 PO) Take by mouth.    [provider]  oseltamivir  (TAMIFLU ) 75 MG capsule Take 1 capsule (75 mg total) by mouth  every 12 (twelve) hours. Patient not taking: Reported on 06/03/2024 08/25/23   Teresa Shelba SAUNDERS, NP  predniSONE  (DELTASONE ) 20 MG tablet Take 2 tablets (40 mg total) by mouth daily. Patient not taking: Reported on 06/03/2024 12/25/23   Teresa Shelba SAUNDERS, NP  promethazine -dextromethorphan (PROMETHAZINE -DM) 6.25-15 MG/5ML syrup Take 5 mLs by mouth 4 (four) times daily as needed. Patient not taking: Reported on 06/03/2024 08/31/23   Burnette, Brendaly M, PA-C  triamcinolone  cream (KENALOG ) 0.1 % Apply 1 Application topically 2 (two) times daily. Patient not taking: Reported on 06/03/2024 12/25/23   Teresa Shelba SAUNDERS, NP    Allergies: Sumatriptan    Review of Systems   Skin:  Negative for wound.    Updated Vital Signs BP (!) 171/107 (BP Location: Left Arm)   Pulse 97   Temp 98.5 F (36.9 C) (Oral)   Resp 17   Ht 6' (1.829 m)   Wt (!) 155.6 kg   LMP 05/24/2024 (Approximate)   SpO2 99%   BMI 46.52 kg/m   Physical Exam Vitals and nursing note reviewed.  Constitutional:      General: She is not in acute distress.    Appearance: Normal appearance. She is obese. She is not ill-appearing.  HENT:     Head: Normocephalic and atraumatic.     Mouth/Throat:     Mouth: Mucous membranes are moist.  Eyes:     Extraocular Movements: Extraocular movements intact.  Cardiovascular:     Rate and Rhythm: Normal rate and regular rhythm.  Pulmonary:     Effort: Pulmonary effort is normal.  Musculoskeletal:        General: Swelling and tenderness present. No deformity or signs of injury. Normal range of motion.     Cervical back: Normal range of motion.     Comments: Tender with flexion and extension of ankle, able to full ROM despite pain. Able to bear weight. Distal sensory, pulses, motor intact.  Skin:    General: Skin is warm and dry.     Capillary Refill: Capillary refill takes less than 2 seconds.     Coloration: Skin is not jaundiced or pale.     Findings: Erythema present. No bruising or rash.     Comments: Circular area of swelling to dorsal portion of right foot about 0.5 cm in diameter with larger area extending from it that is erythematous and warm.  Extending past previously marked margins (marked on 11/29).  Neurological:     Mental Status: She is alert and oriented to person, place, and time.     Media Information  Document Information  Photos  Right dorsal foot  06/06/2024 20:35  Attached To:  Hospital Encounter on 06/06/24  Source Information  Rosina Almarie LABOR, PA-C  [facility]    (all labs ordered are listed, but only abnormal results are displayed) Labs Reviewed  COMPREHENSIVE METABOLIC PANEL WITH GFR - Abnormal;  Notable for the following components:      Result Value   Glucose, Bld 112 (*)    ALT 54 (*)    All other components within normal limits  CBC WITH DIFFERENTIAL/PLATELET  HCG, SERUM, QUALITATIVE  SEDIMENTATION RATE  I-STAT CG4 LACTIC ACID, ED  I-STAT CG4 LACTIC ACID, ED    EKG: None  Radiology: DG Foot Complete Right Result Date: 06/06/2024 CLINICAL DATA:  Atraumatic dorsal right foot pain and swelling. EXAM: RIGHT FOOT COMPLETE - 3+ VIEW COMPARISON:  None Available. FINDINGS: There is no evidence of an acute fracture or dislocation.  Moderate severity chronic and degenerative changes are noted along the dorsal aspect of the mid right foot. Marked severity dorsal soft tissue swelling is noted throughout the right foot. IMPRESSION: Marked severity dorsal soft tissue swelling without evidence of an acute osseous abnormality. Electronically Signed   By: Suzen Dials M.D.   On: 06/06/2024 19:47     Procedures   Medications Ordered in the ED - No data to display    Patient presents to the ED for concern of right foot swelling and tenderness, this involves an extensive number of treatment options, and is a complaint that carries with it a high risk of complications and morbidity.  The differential diagnosis includes DVT, cellulitis, bug bite, soft tissue injury, abscess, edema, fracture, or dislocation.  Based on physical exam and HPI, low suspicion for DVT or soft tissue injury. Imaging shows no fracture or dislocation.   Additional history obtained:  Additional history obtained from  Outside Medical Records   External records from outside source obtained and reviewed including recent urgent care notes for same   Lab Tests:  I Ordered, and personally interpreted labs.  The pertinent results include: No leukocytosis.  Normal lactic acid and sed rate.   Imaging Studies ordered:  I ordered imaging studies including right foot xray  I independently visualized and  interpreted imaging which showed no acute osseous abnormality. Did show dorsal tissue swelling. I agree with the radiologist interpretation   Problem List / ED Course:  Right foot swelling, tenderness, erythema.  Imaging shows no acute osseous abnormalities associated with soft tissue swelling.  No obvious break in skin or bug bites.  Appears cellulitic in nature.  Patient previously taking Keflex and switched to levofloxacin when there was progression of redness/swelling.  Patient only took 1 dose of levofloxacin prior to evaluation here.  Patient reassured with imaging and blood work.  Encouraged to continue levofloxacin for at least 3 days to assess for response to medication.   Reevaluation:  After the interventions noted above, I reevaluated the patient and found that they have :improved   Dispostion:  After consideration of the diagnostic results and the patients response to treatment, I feel that the patent would benefit from continued supportive care in the home setting with previously prescribed antibiotic regimen and close follow-up with primary care for continued monitoring of and management of symptoms.    Medical Decision Making Amount and/or Complexity of Data Reviewed Labs: ordered. Radiology: ordered.     Final diagnoses:  Cellulitis of right lower extremity    ED Discharge Orders     None          Rosina Almarie DELENA DEVONNA 06/06/24 2049    Patt Alm Macho, MD 06/06/24 [phone]

## 2024-06-06 NOTE — ED Provider Triage Note (Signed)
 Emergency Medicine Provider Triage Evaluation Note  Shannon Clayton , a 48 y.o. female  was evaluated in triage.  Pt complains of swelling and tenderness to the top of right foot that began 11/29.  Previously seen in urgent care, initially given Keflex which she took for 2 days.  When erythema crossed the area that urgent care had marked previously she contacted the office and they switched her to levofloxacin.  She took a total of 2 days of Keflex and has taken 1 dose of levofloxacin.  She reports tightness of the right foot and tenderness with extension and flexion of the ankle.  She has never experienced something like this in the past.  She denies injury or bug bite.  Review of Systems  Positive: Circular area of swelling to dorsal portion of right foot with larger area extending from it that is erythematous and warm.  More distinctive circular area is nonfluctuant. Negative: Fevers, shortness of breath, chest pain, N/V/D, loss of consciousness.  Physical Exam  BP (!) 171/107 (BP Location: Left Arm)   Pulse 97   Temp 98.5 F (36.9 C) (Oral)   Resp 17   Ht 6' (1.829 m)   Wt (!) 155.6 kg   LMP 05/24/2024 (Approximate)   SpO2 99%   BMI 46.52 kg/m  Gen:   Awake, no distress   Resp:  Normal effort  MSK:   Moves extremities without difficulty.  Notes pain with flexion and extension of ankle.  Is able to fully ROM ankle despite pain. Other:  Circular area of swelling to dorsal portion of right foot about 0.5 cm in diameter with larger area extending from it that is erythematous and warm.  Extending past previously marked margins (marked on 11/29).  Medical Decision Making  Medically screening exam initiated at 6:33 PM.  Appropriate orders placed.  Delon Janit Lee was informed that the remainder of the evaluation will be completed by another provider, this initial triage assessment does not replace that evaluation, and the importance of remaining in the ED until their evaluation is  complete.   Rosina Almarie LABOR, PA-C 06/06/24 RONOLD

## 2024-06-11 ENCOUNTER — Ambulatory Visit: Admission: EM | Admit: 2024-06-11 | Discharge: 2024-06-11 | Disposition: A | Discharge status: Home / Self Care

## 2024-06-11 ENCOUNTER — Other Ambulatory Visit: Payer: Self-pay

## 2024-06-11 ENCOUNTER — Encounter: Payer: Self-pay | Admitting: Emergency Medicine

## 2024-06-11 ENCOUNTER — Emergency Department
Admission: EM | Admit: 2024-06-11 | Discharge: 2024-06-11 | Disposition: A | Discharge status: Home / Self Care | Attending: Emergency Medicine | Admitting: Emergency Medicine

## 2024-06-11 DIAGNOSIS — L03115 Cellulitis of right lower limb: Secondary | ICD-10-CM

## 2024-06-11 DIAGNOSIS — I10 Essential (primary) hypertension: Secondary | ICD-10-CM | POA: Insufficient documentation

## 2024-06-11 LAB — CBC WITH DIFFERENTIAL/PLATELET
Abs Immature Granulocytes: 0.06 K/uL (ref 0.00–0.07)
Basophils Absolute: 0.1 K/uL (ref 0.0–0.1)
Basophils Relative: 1 %
Eosinophils Absolute: 0.2 K/uL (ref 0.0–0.5)
Eosinophils Relative: 3 %
HCT: 41.2 % (ref 36.0–46.0)
Hemoglobin: 13.6 g/dL (ref 12.0–15.0)
Immature Granulocytes: 1 %
Lymphocytes Relative: 38 %
Lymphs Abs: 3.7 K/uL (ref 0.7–4.0)
MCH: 28.8 pg (ref 26.0–34.0)
MCHC: 33 g/dL (ref 30.0–36.0)
MCV: 87.1 fL (ref 80.0–100.0)
Monocytes Absolute: 0.6 K/uL (ref 0.1–1.0)
Monocytes Relative: 6 %
Neutro Abs: 4.9 K/uL (ref 1.7–7.7)
Neutrophils Relative %: 51 %
Platelets: 407 K/uL — ABNORMAL HIGH (ref 150–400)
RBC: 4.73 MIL/uL (ref 3.87–5.11)
RDW: 13.5 % (ref 11.5–15.5)
WBC: 9.5 K/uL (ref 4.0–10.5)
nRBC: 0 % (ref 0.0–0.2)

## 2024-06-11 LAB — COMPREHENSIVE METABOLIC PANEL WITH GFR
ALT: 77 U/L — ABNORMAL HIGH (ref 0–44)
AST: 49 U/L — ABNORMAL HIGH (ref 15–41)
Albumin: 4.1 g/dL (ref 3.5–5.0)
Alkaline Phosphatase: 107 U/L (ref 38–126)
Anion gap: 12 (ref 5–15)
BUN: 15 mg/dL (ref 6–20)
CO2: 25 mmol/L (ref 22–32)
Calcium: 9.2 mg/dL (ref 8.9–10.3)
Chloride: 100 mmol/L (ref 98–111)
Creatinine, Ser: 0.81 mg/dL (ref 0.44–1.00)
GFR, Estimated: >60 mL/min (ref >60)
Glucose, Bld: 94 mg/dL (ref 70–99)
Potassium: 4.2 mmol/L (ref 3.5–5.1)
Sodium: 138 mmol/L (ref 135–145)
Total Bilirubin: 0.3 mg/dL (ref 0.0–1.2)
Total Protein: 7.2 g/dL (ref 6.5–8.1)

## 2024-06-11 LAB — LACTIC ACID, PLASMA: Lactic Acid, Venous: 0.8 mmol/L (ref 0.5–1.9)

## 2024-06-11 MED ORDER — DOXYCYCLINE MONOHYDRATE 100 MG PO TABS: 100.0000 mg | ORAL_TABLET | Freq: Two times a day (BID) | ORAL | 0 refills | Status: AC

## 2024-06-11 MED ORDER — CEPHALEXIN 500 MG PO CAPS: 500.0000 mg | ORAL_CAPSULE | Freq: Four times a day (QID) | ORAL | 0 refills | Status: AC

## 2024-06-11 NOTE — ED Provider Notes (Signed)
 [facility] Provider Note    Event Date/Time   First MD Initiated Contact with Patient 06/11/24 1422     (approximate)   History   Abscess   HPI  Shannon Clayton is a 48 y.o. female who presents today for evaluation of a cellulitis.  She reports that she was given a prescription for Keflex  on 11/29 but only took 2 doses of it.  She came back 6 days ago, and her medication was changed to levofloxacin , and then she returned to the ER in 12/2 who encouraged her to continue the levofloxacin .  She is back today for a wound recheck.  She reports that the swelling has improved significantly and still has the erythema, but now there is an open area to the front of her ankle.  She went to an urgent care who sent her to the emergency department for further evaluation.  She denies any fevers or chills.  She has been able to ambulate without difficulty.  She has full and normal range of motion of her ankle.  She has not noticed any red streaks up her leg.  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/03/2021   Hip pain 10/03/2021   ASCUS with positive high risk HPV cervical 07/27/2019   Elevated liver enzymes 06/23/2019   Encounter for screening colonoscopy 06/23/2019   Prediabetes 06/09/2019   Vitamin D  deficiency 06/09/2019   Migraine with aura and without status migrainosus, not intractable 05/28/2019   Essential hypertension 05/28/2019   Anxiety and depression 05/28/2019   Morbid obesity (HCC) 05/10/2014   Irregular periods 05/10/2014   Anxiety 11/21/2012          Physical Exam   Triage Vital Signs: ED Triage Vitals  Encounter Vitals Group     BP 06/11/24 1350 (!) 160/112     Girls Systolic BP Percentile --      Girls Diastolic BP Percentile --      Boys Systolic BP Percentile --      Boys Diastolic BP Percentile --       Pulse Rate 06/11/24 1350 81     Resp 06/11/24 1350 17     Temp 06/11/24 1350 98.2 F (36.8 C)     Temp src --      SpO2 06/11/24 1350 100 %     Weight 06/11/24 1351 (!) 343 lb 0.6 oz (155.6 kg)     Height 06/11/24 1351 6' (1.829 m)     Head Circumference --      Peak Flow --      Pain Score 06/11/24 1350 3     Pain Loc --      Pain Education --      Exclude from Growth Chart --     Most recent vital signs: Vitals:   06/11/24 1350  BP: (!) 160/112  Pulse: 81  Resp: 17  Temp: 98.2 F (36.8 C)  SpO2: 100%    Physical Exam Vitals and nursing note reviewed.  Constitutional:      General: Awake and alert. No acute distress.    Appearance: Normal appearance.  HENT:     Head: Normocephalic and atraumatic.     Mouth: Mucous membranes are moist.  Eyes:     General: PERRL. Normal EOMs        Right eye: No discharge.  Left eye: No discharge.     Conjunctiva/sclera: Conjunctivae normal.  Cardiovascular:     Rate and Rhythm: Normal rate.     Pulses: Normal pulses.  Pulmonary:     Effort: Pulmonary effort is normal. No respiratory distress.     Breath sounds: Normal breath sounds.  Abdominal:     Abdomen is soft. There is no abdominal tenderness. No rebound or guarding. No distention. Musculoskeletal:        General: No swelling. Normal range of motion.     Cervical back: Normal range of motion and neck supple.  Right ankle: 1 x 1 cm circular area of purulence, though no active drainage.  Minimally tender to palpation.  No fluctuance.  Previously outlined area shows that the cellulitis has significantly improved.  She has full active and passive range of motion of her ankle.  No pain with axial loading.  No lymphangitis.  Normal pedal pulses.  No crepitus. Skin:    General: Skin is warm and dry.     Capillary Refill: Capillary refill takes less than 2 seconds.     Findings: No rash.  Neurological:     Mental Status: The patient is awake and alert.      ED  Results / Procedures / Treatments   Labs (all labs ordered are listed, but only abnormal results are displayed) Labs Reviewed  CBC WITH DIFFERENTIAL/PLATELET - Abnormal; Notable for the following components:      Result Value   Platelets 407 (*)    All other components within normal limits  COMPREHENSIVE METABOLIC PANEL WITH GFR - Abnormal; Notable for the following components:   AST 49 (*)    ALT 77 (*)    All other components within normal limits  LACTIC ACID, PLASMA     EKG     RADIOLOGY     PROCEDURES:  Critical Care performed:   Procedures   MEDICATIONS ORDERED IN ED: Medications - No data to display   IMPRESSION / MDM / ASSESSMENT AND PLAN / ED COURSE  I reviewed the triage vital signs and the nursing notes.   Differential diagnosis includes, but is not limited to, cellulitis, abscess, incomplete treatment.  I reviewed the patient's chart.  Patient was seen in urgent care today, who sent her to the emergency department for further evaluation.  Labs today are overall quite reassuring.  She has no leukocytosis.  She has a normal lactate.  There is no tachycardia, hypotension, or fever.  There is absolutely no bony tenderness on exam, and she has full and normal range of motion of her ankle, no pain with axial loading, and normal gait, low suspicion for septic joint.  She is not immunosuppressed for any reason.  The previous outlined area reveals that today it is significantly improved.  I do not think that she has had a treatment failure, I think that she has been incompletely treated.  The area is also significantly improved from the photo on 06/06/2024.  I also reviewed her previous x-ray, there is soft tissue swelling without osseous abnormality.  Given improvement clinically today, I do not suspect osteomyelitis.  She also does not have any bony tenderness.  There is no fluctuance to suggest abscess.  The area has opened.  She has not had great staph/MRSA  coverage.  Overall the erythema has improved significantly, however she should be treated with a full course of Keflex  in addition to Doxy for full coverage.  While I do not suspect Pseudomonas, the  levo will help with that.  I also recommended that she follow-up with podiatry, and the appropriate follow-up information was provided.  Patient's presentation is most consistent with acute complicated illness / injury requiring diagnostic workup.     FINAL CLINICAL IMPRESSION(S) / ED DIAGNOSES   Final diagnoses:  Cellulitis of right lower extremity     Rx / DC Orders   ED Discharge Orders          Ordered    cephALEXin  (KEFLEX ) 500 MG capsule  4 times daily        06/11/24 1446    doxycycline  (ADOXA) 100 MG tablet  2 times daily        06/11/24 1446             Note:  This document was prepared using Dragon voice recognition software and may include unintentional dictation errors.   Sheana Bir E, PA-C 06/11/24 1500    Ernest Ronal BRAVO, MD 06/12/24 [phone]

## 2024-06-11 NOTE — ED Triage Notes (Signed)
 Patient here for a wound check. Patient has been  seen several times for same. Patient states her antibiotics has been changed once. Patient states she is on her dose of antibiotics. Rates pain 4/10.

## 2024-06-11 NOTE — ED Notes (Signed)
 Patient is being discharged from the Urgent Care and sent to the Emergency Department via POV . Per Arlyss Leita BRAVO, PA-C , patient is in need of higher level of care due to foot infection. Patient is aware and verbalizes understanding of plan of care.  Vitals:   06/11/24 1234 06/11/24 1236  BP:  (!) 141/84  Pulse:  72  Resp:  18  Temp:  97.8 F (36.6 C)  SpO2: 98% 98%

## 2024-06-11 NOTE — ED Provider Notes (Signed)
 [name]    CSN: [id number] Arrival date & time: 06/11/24  1104      History   Chief Complaint Chief Complaint  Patient presents with   Wound Check    Right foot/ankle     HPI Amberlie Gaillard is a 48 y.o. female presenting for cellulitis check - R ankle.  She has been seen for this on 11/29 (prescribed Keflex ) and 12/1 at our urgent care (Keflex  was changed to levofloxacin ), and then on 12/2 by the emergency department (who encouraged her to continue the levofloxacin ). Patient here for a wound check.  States that the extent of the swelling has improved, but now there is an abscess.  Rates pain 4/10.   HPI  Past Medical History:  Diagnosis Date   Depression    Frequent headaches    Hypertension    Migraine headache with aura     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/03/2021   Hip pain 10/03/2021   ASCUS with positive high risk HPV cervical 07/27/2019   Elevated liver enzymes 06/23/2019   Encounter for screening colonoscopy 06/23/2019   Prediabetes 06/09/2019   Vitamin D  deficiency 06/09/2019   Migraine with aura and without status migrainosus, not intractable 05/28/2019   Essential hypertension 05/28/2019   Anxiety and depression 05/28/2019   Morbid obesity (HCC) 05/10/2014   Irregular periods 05/10/2014   Anxiety 11/21/2012    Past Surgical History:  Procedure Laterality Date   CESAREAN SECTION  2006   COLONOSCOPY WITH PROPOFOL  N/A 03/19/2023   Procedure: COLONOSCOPY WITH PROPOFOL ;  Surgeon: Therisa Bi, MD;  Location: [facility] ENDOSCOPY;  Service: Gastroenterology;  Laterality: N/A;  1ST CASE - PATIENT AWARE   ear pinning  1984   POLYPECTOMY  03/19/2023   Procedure: POLYPECTOMY;  Surgeon: Therisa Bi, MD;  Location: [facility] ENDOSCOPY;  Service: Gastroenterology;;   TONSILLECTOMY AND ADENOIDECTOMY  2011    OB  History     Gravida  1   Para  1   Term  1   Preterm      AB      Living  1      SAB      IAB      Ectopic      Multiple      Live Births  1            Home Medications    Prior to Admission medications   Medication Sig Start Date End Date Taking? Authorizing Provider  amLODipine  (NORVASC ) 5 MG tablet TAKE 1 TABLET (5 MG TOTAL) BY MOUTH DAILY. 03/10/24   Pender, Julie F, FNP  benzonatate  (TESSALON ) 100 MG capsule Take 1-2 capsules (100-200 mg total) by mouth 3 (three) times daily as needed. Patient not taking: Reported on 06/03/2024 08/31/23   Burnette, Greydis M, PA-C  busPIRone  (BUSPAR ) 5 MG tablet TAKE 1 TABLET BY MOUTH THREE TIMES A DAY AS NEEDED 01/11/24   Pender, Julie F, FNP  butalbital -acetaminophen-caffeine  (FIORICET) 50-325-40 MG tablet Take 1-2 tablets by mouth daily as needed for headache. Limit use no more 2x per week 10/03/21   Pender, Julie F, FNP  co-enzyme Q-10 30 MG capsule Take 30 mg by mouth 3 (three) times daily.    [provider]  escitalopram  (LEXAPRO ) 20 MG tablet TAKE 1 TABLET BY MOUTH EVERY DAY 04/24/24   Pender, Julie F, FNP  fluconazole  (DIFLUCAN ) 150 MG tablet Take 1 tablet (150 mg total) by mouth every three (3) days as needed for up to 2 doses. 06/05/24   White, Adrienne R, NP  ipratropium (ATROVENT ) 0.03 % nasal spray Place 2 sprays into both nostrils every 12 (twelve) hours. 10/27/22   Gladis Elsie BROCKS, PA-C  levofloxacin  (LEVAQUIN ) 500 MG tablet Take 1 tablet (500 mg total) by mouth daily. 06/05/24   White, Shelba SAUNDERS, NP  losartan  (COZAAR ) 25 MG tablet TAKE 1 TABLET (25 MG TOTAL) BY MOUTH DAILY. 01/11/24   Pender, Julie F, FNP  MAGNESIUM  PO Take by mouth.    [provider]  meloxicam (MOBIC) 15 MG tablet Take 15 mg by mouth daily. 12/14/22   [provider]  minoxidil  (LONITEN ) 2.5 MG tablet TAKE 1 TABLET BY MOUTH EVERY DAY 02/25/24   Pender, Julie F, FNP  Omega-3 Fatty Acids (FISH OIL OMEGA-3 PO) Take by mouth.     [provider]  oseltamivir  (TAMIFLU ) 75 MG capsule Take 1 capsule (75 mg total) by mouth every 12 (twelve) hours. Patient not taking: Reported on 06/03/2024 08/25/23   Teresa Shelba SAUNDERS, NP  predniSONE  (DELTASONE ) 20 MG tablet Take 2 tablets (40 mg total) by mouth daily. Patient not taking: Reported on 06/03/2024 12/25/23   Teresa Shelba SAUNDERS, NP  promethazine -dextromethorphan (PROMETHAZINE -DM) 6.25-15 MG/5ML syrup Take 5 mLs by mouth 4 (four) times daily as needed. Patient not taking: Reported on 06/03/2024 08/31/23   Burnette, Taneeka M, PA-C  triamcinolone  cream (KENALOG ) 0.1 % Apply 1 Application topically 2 (two) times daily. Patient not taking: Reported on 06/03/2024 12/25/23   Teresa Shelba SAUNDERS, NP    Family History Family History  Problem Relation Age of Onset   Cancer Mother        MM   Arthritis Mother    Hyperlipidemia Mother    Hypertension Mother    Anxiety disorder Mother    Arthritis Father    Cancer Maternal Grandmother        lymphoma 2x   Hypertension Maternal Grandmother    Hypertension Maternal Grandfather    Diabetes Maternal Grandfather    Cancer Paternal Grandfather        skin cancer    Breast cancer Neg Hx     Social History Social History   Tobacco Use   Smoking status: Never   Smokeless tobacco: Never  Vaping Use   Vaping status: Never Used  Substance Use Topics   Alcohol use: Yes   Drug use: No     Allergies   Sumatriptan   Review of Systems Review of Systems  Skin:  Positive for wound.     Physical Exam Triage Vital Signs ED Triage Vitals  Encounter Vitals Group     BP      Girls Systolic BP Percentile      Girls Diastolic BP Percentile      Boys Systolic BP Percentile      Boys Diastolic BP Percentile      Pulse      Resp      Temp      Temp src      SpO2      Weight      Height      Head Circumference      Peak Flow      Pain Score      Pain Loc      Pain Education      Exclude from Growth Chart  No  data found.  Updated Vital Signs BP (!) 141/84 (BP Location: Left Arm)   Pulse 72   Temp 97.8 F (36.6 C) (Oral)   Resp 18   LMP 05/24/2024 (Approximate)   SpO2 98%   Visual Acuity Right Eye Distance:   Left Eye Distance:   Bilateral Distance:    Right Eye Near:   Left Eye Near:    Bilateral Near:     Physical Exam Vitals reviewed.  Constitutional:      General: She is not in acute distress.    Appearance: Normal appearance. She is not ill-appearing.  HENT:     Head: Normocephalic and atraumatic.  Pulmonary:     Effort: Pulmonary effort is normal.  Skin:    Comments: See image below R ankle: there is a 1.5 cm abscess with tenderness, induration and fluctuance, surrounded by trace erythema.  Neurological:     General: No focal deficit present.     Mental Status: She is alert and oriented to person, place, and time.  Psychiatric:        Mood and Affect: Mood normal.        Behavior: Behavior normal.        Thought Content: Thought content normal.        Judgment: Judgment normal.      UC Treatments / Results  Labs (all labs ordered are listed, but only abnormal results are displayed) Labs Reviewed - No data to display  EKG   Radiology No results found.  Procedures Procedures (including critical care time)  Medications Ordered in UC Medications - No data to display  Initial Impression / Assessment and Plan / UC Course  I have reviewed the triage vital signs and the nursing notes.  Pertinent labs & imaging results that were available during my care of the patient were reviewed by me and considered in my medical decision making (see chart for details).     Patient is a pleasant 48 y.o. female presenting with abscess and cellulitis. The patient is afebrile and nontachycardic.  Antipyretic has not been administered today.  She has been seen for this at our urgent care on 11/29 (prescribed Keflex ) and 12/1 at our urgent care (Keflex  was changed to  levofloxacin ), and then on 12/2 by the emergency department (who encouraged her to continue the levofloxacin ).  At today's visit, the cellulitis has improved, but the abscess has worsened.  I am concerned that there could be osteomyelitis.  Following discussion with patient, she will present to the emergency department for further evaluation  She does not have a history of kidney disease.  CMP WNL 06/06/2024 labwork.  Final Clinical Impressions(s) / UC Diagnoses   Final diagnoses:  Cellulitis of right ankle   Discharge Instructions   None    ED Prescriptions   None    PDMP not reviewed this encounter.   Arlyss Leita BRAVO, PA-C 06/11/24 1342

## 2024-06-11 NOTE — Discharge Instructions (Signed)
 Please take the antibiotics as prescribed.  Please follow-up with the podiatrist.  Please return for any new, worsening, or changing symptoms or other concerns including difficulty walking, fevers, joint pain, or any other concerns.  It was a pleasure caring for you today.

## 2024-06-11 NOTE — ED Triage Notes (Signed)
 Pt comes in via pov with complaints of an abscess on the top of her right foot for the past week. Pt was seen at the UC where she was prescribed antibiotics. Pt states that she was prescribed antibiotics, but it got worse. She was then placed on another antibiotic which she will complete. Pt complains of tightness and discomfort in the area 3/10. Pt ambulatory in triage.

## 2024-06-17 ENCOUNTER — Other Ambulatory Visit: Payer: Self-pay | Admitting: Nurse Practitioner

## 2024-06-17 DIAGNOSIS — I10 Essential (primary) hypertension: Secondary | ICD-10-CM

## 2024-06-20 NOTE — Telephone Encounter (Signed)
 OV 08/13/23- many canceled appointments- will give courtesy #21 to get to the appointment Requested Prescriptions  Pending Prescriptions Disp Refills   amLODipine  (NORVASC ) 5 MG tablet [Pharmacy Med Name: AMLODIPINE  BESYLATE 5 MG TAB] 30 tablet 2    Sig: TAKE 1 TABLET (5 MG TOTAL) BY MOUTH DAILY.     Cardiovascular: Calcium Channel Blockers 2 Failed - 06/20/2024 11:31 AM      Failed - Last BP in normal range    BP Readings from Last 1 Encounters:  06/11/24 (!) 160/112         Failed - Valid encounter within last 6 months    Recent Outpatient Visits           10 months ago Migraine with aura and without status migrainosus, not intractable   [facility] Gareth Mliss [place], [place]              Passed - Last Heart Rate in normal range    Pulse Readings from Last 1 Encounters:  06/11/24 81

## 2024-07-09 ENCOUNTER — Other Ambulatory Visit: Payer: Self-pay | Admitting: Nurse Practitioner

## 2024-07-09 DIAGNOSIS — I10 Essential (primary) hypertension: Secondary | ICD-10-CM

## 2024-07-11 NOTE — Telephone Encounter (Signed)
 Courtesy refill given, appointment needed.   Requested Prescriptions  Pending Prescriptions Disp Refills   amLODipine  (NORVASC ) 5 MG tablet [Pharmacy Med Name: AMLODIPINE  BESYLATE 5 MG TAB] 22 tablet 0    Sig: TAKE 1 TABLET (5 MG TOTAL) BY MOUTH DAILY.     Cardiovascular: Calcium Channel Blockers 2 Failed - 07/11/2024 10:50 AM      Failed - Last BP in normal range    BP Readings from Last 1 Encounters:  06/11/24 (!) 160/112         Failed - Valid encounter within last 6 months    Recent Outpatient Visits           11 months ago Migraine with aura and without status migrainosus, not intractable   [facility] Gareth Mliss [place]              Passed - Last Heart Rate in normal range    Pulse Readings from Last 1 Encounters:  06/11/24 81

## 2024-07-12 ENCOUNTER — Ambulatory Visit: Admitting: Nurse Practitioner

## 2024-07-17 ENCOUNTER — Other Ambulatory Visit: Payer: Self-pay | Admitting: Nurse Practitioner

## 2024-07-17 DIAGNOSIS — F330 Major depressive disorder, recurrent, mild: Secondary | ICD-10-CM

## 2024-07-17 DIAGNOSIS — F419 Anxiety disorder, unspecified: Secondary | ICD-10-CM

## 2024-07-18 NOTE — Telephone Encounter (Signed)
 Requested Prescriptions  Pending Prescriptions Disp Refills   escitalopram  (LEXAPRO ) 20 MG tablet [Pharmacy Med Name: ESCITALOPRAM  20 MG TABLET] 30 tablet 0    Sig: TAKE 1 TABLET BY MOUTH EVERY DAY     Psychiatry:  Antidepressants - SSRI Failed - 07/18/2024 10:32 AM      Failed - Completed PHQ-2 or PHQ-9 in the last 360 days      Failed - Valid encounter within last 6 months    Recent Outpatient Visits           11 months ago Migraine with aura and without status migrainosus, not intractable   [facility] Gareth Mliss [place], [place]

## 2024-07-20 ENCOUNTER — Ambulatory Visit: Admitting: Nurse Practitioner

## 2024-07-20 ENCOUNTER — Encounter: Payer: Self-pay | Admitting: Nurse Practitioner

## 2024-07-20 VITALS — BP 138/98 | HR 87 | Temp 98.0°F | Ht 72.0 in | Wt 351.0 lb

## 2024-07-20 DIAGNOSIS — R7303 Prediabetes: Secondary | ICD-10-CM

## 2024-07-20 DIAGNOSIS — F419 Anxiety disorder, unspecified: Secondary | ICD-10-CM | POA: Diagnosis not present

## 2024-07-20 DIAGNOSIS — G43109 Migraine with aura, not intractable, without status migrainosus: Secondary | ICD-10-CM

## 2024-07-20 DIAGNOSIS — F330 Major depressive disorder, recurrent, mild: Secondary | ICD-10-CM

## 2024-07-20 DIAGNOSIS — E785 Hyperlipidemia, unspecified: Secondary | ICD-10-CM

## 2024-07-20 DIAGNOSIS — I10 Essential (primary) hypertension: Secondary | ICD-10-CM | POA: Diagnosis not present

## 2024-07-20 DIAGNOSIS — Z1231 Encounter for screening mammogram for malignant neoplasm of breast: Secondary | ICD-10-CM

## 2024-07-20 DIAGNOSIS — L658 Other specified nonscarring hair loss: Secondary | ICD-10-CM | POA: Diagnosis not present

## 2024-07-20 MED ORDER — AMLODIPINE BESYLATE 5 MG PO TABS: 5.0000 mg | ORAL_TABLET | Freq: Every day | ORAL | 1 refills | Status: AC

## 2024-07-20 MED ORDER — BUSPIRONE HCL 5 MG PO TABS: 5.0000 mg | ORAL_TABLET | Freq: Three times a day (TID) | ORAL | 2 refills | Status: AC | PRN

## 2024-07-20 MED ORDER — ESCITALOPRAM OXALATE 20 MG PO TABS: 20.0000 mg | ORAL_TABLET | Freq: Every day | ORAL | 0 refills | Status: AC

## 2024-07-20 MED ORDER — BUTALBITAL-APAP-CAFFEINE 50-325-40 MG PO TABS: 1.0000 | ORAL_TABLET | Freq: Every day | ORAL | 2 refills | Status: AC | PRN

## 2024-07-20 MED ORDER — LOSARTAN POTASSIUM 50 MG PO TABS: 50.0000 mg | ORAL_TABLET | Freq: Every day | ORAL | 1 refills | Status: AC

## 2024-07-20 MED ORDER — MINOXIDIL 2.5 MG PO TABS: 2.5000 mg | ORAL_TABLET | Freq: Every day | ORAL | 1 refills | Status: AC

## 2024-07-20 NOTE — Progress Notes (Signed)
 "  BP (!) 138/98   Pulse 87   Temp 98 F (36.7 C)   Ht 6' (1.829 m)   Wt (!) 351 lb (159.2 kg)   SpO2 99%   BMI 47.60 kg/m    Subjective:    Patient ID: Shannon Clayton, female    DOB: [birth year], 49 y.o.   MRN: [id number]  HPI: Shannon Clayton is a 49 y.o. female  Chief Complaint  Patient presents with   Medical Management of Chronic Issues   Discussed the use of AI scribe software for clinical note transcription with the patient, who gave verbal consent to proceed.  History of Present Illness Shannon Clayton is a 49 year old female with hypertension, migraines, hyperlipidemia, depression, obesity, prediabetes, and anxiety who presents for a routine follow-up.  Hypertension - Elevated blood pressure, including during recent urgent care and ER visits in December - Occasional missed doses of antihypertensive medication - No associated symptoms: no headaches, chest pain, shortness of breath, swelling, or blurred vision BP Readings from Last 3 Encounters:  07/20/24 (!) 138/98  06/11/24 (!) 160/112  06/11/24 (!) 141/84     Cephalgia (migraines) - Increased frequency of migraines, attributed to stress from financial issues - Currently out of Fioricet and requires a refill  Mood and anxiety symptoms - Ongoing depression and anxiety - Continues Lexapro  and buspirone , uncertain about her effectiveness - Experiencing significant stress related to financial difficulties, which she has recently begun to address  Obesity and metabolic findings - Weight: 351 pounds - BMI: 47.6 - Last A1c: 6.1 (prediabetes) - Triglycerides: 164  Liver function abnormalities - Elevated liver enzymes on last blood work in December (ALT 77) - No excessive alcohol consumption - Increased use of Advil compared to Tylenol  Preventive health maintenance - Due for mammogram and Pap smear         07/20/2024    3:27 PM 05/13/2023    2:49 PM 11/27/2022    3:39 PM  Depression screen  PHQ 2/9  Decreased Interest 0 0 0  Down, Depressed, Hopeless 1 0 0  PHQ - 2 Score 1 0 0  Altered sleeping 3 0   Tired, decreased energy 2 0   Change in appetite 3 0   Feeling bad or failure about yourself  3 0   Trouble concentrating 0 0   Moving slowly or fidgety/restless 0 0   Suicidal thoughts 0 0   PHQ-9 Score 12 0    Difficult doing work/chores Somewhat difficult Not difficult at all      Data saved with a previous flowsheet row definition       07/20/2024    3:28 PM 11/09/2022    3:38 PM 09/18/2022    8:08 AM 03/06/2022    3:12 PM  GAD 7 : Generalized Anxiety Score  Nervous, Anxious, on Edge 3 1 0 2  Control/stop worrying 3 1 0 2  Worry too much - different things 3 0 0 2  Trouble relaxing 2 1 0 1  Restless  0 0 1  Easily annoyed or irritable 1 0 0 0  Afraid - awful might happen 1 0 0 0  Total GAD 7 Score  3 0 8  Anxiety Difficulty Somewhat difficult  Not difficult at all Not difficult at all     Relevant past medical, surgical, family and social history reviewed and updated as indicated. Interim medical history since our last visit reviewed. Allergies and medications reviewed and updated.  Review  of Systems  Constitutional: Negative for fever or weight change.  Respiratory: Negative for cough and shortness of breath.   Cardiovascular: Negative for chest pain or palpitations.  Gastrointestinal: Negative for abdominal pain, no bowel changes.  Musculoskeletal: Negative for gait problem or joint swelling.  Skin: Negative for rash.  Neurological: Negative for dizziness or headache.  No other specific complaints in a complete review of systems (except as listed in HPI above).      Objective:      BP (!) 138/98   Pulse 87   Temp 98 F (36.7 C)   Ht 6' (1.829 m)   Wt (!) 351 lb (159.2 kg)   SpO2 99%   BMI 47.60 kg/m    Wt Readings from Last 3 Encounters:  07/20/24 (!) 351 lb (159.2 kg)  06/11/24 (!) 343 lb 0.6 oz (155.6 kg)  06/06/24 (!) 343 lb (155.6 kg)     Physical Exam VITALS: BP- 141/84 MEASUREMENTS: Weight- 351, BMI- 47.6. GENERAL: Alert, cooperative, well developed, no acute distress HEENT: Normocephalic, normal oropharynx, moist mucous membranes CHEST: Clear to auscultation bilaterally, No wheezes, rhonchi, or crackles CARDIOVASCULAR: Normal heart rate and rhythm, S1 and S2 normal without murmurs ABDOMEN: Soft, non-tender, non-distended, without organomegaly, Normal bowel sounds EXTREMITIES: No cyanosis or edema NEUROLOGICAL: Cranial nerves grossly intact, Moves all extremities without gross motor or sensory deficit  Results for orders placed or performed during the hospital encounter of 06/11/24  CBC with Differential   Collection Time: 06/11/24  1:53 PM  Result Value Ref Range   WBC 9.5 4.0 - 10.5 K/uL   RBC 4.73 3.87 - 5.11 MIL/uL   Hemoglobin 13.6 12.0 - 15.0 g/dL   HCT 58.7 63.9 - 53.9 %   MCV 87.1 80.0 - 100.0 fL   MCH 28.8 26.0 - 34.0 pg   MCHC 33.0 30.0 - 36.0 g/dL   RDW 86.4 88.4 - 84.4 %   Platelets 407 (H) 150 - 400 K/uL   nRBC 0.0 0.0 - 0.2 %   Neutrophils Relative % 51 %   Neutro Abs 4.9 1.7 - 7.7 K/uL   Lymphocytes Relative 38 %   Lymphs Abs 3.7 0.7 - 4.0 K/uL   Monocytes Relative 6 %   Monocytes Absolute 0.6 0.1 - 1.0 K/uL   Eosinophils Relative 3 %   Eosinophils Absolute 0.2 0.0 - 0.5 K/uL   Basophils Relative 1 %   Basophils Absolute 0.1 0.0 - 0.1 K/uL   Immature Granulocytes 1 %   Abs Immature Granulocytes 0.06 0.00 - 0.07 K/uL  Comprehensive metabolic panel   Collection Time: 06/11/24  1:53 PM  Result Value Ref Range   Sodium 138 135 - 145 mmol/L   Potassium 4.2 3.5 - 5.1 mmol/L   Chloride 100 98 - 111 mmol/L   CO2 25 22 - 32 mmol/L   Glucose, Bld 94 70 - 99 mg/dL   BUN 15 6 - 20 mg/dL   Creatinine, Ser 9.18 0.44 - 1.00 mg/dL   Calcium 9.2 8.9 - 89.6 mg/dL   Total Protein 7.2 6.5 - 8.1 g/dL   Albumin 4.1 3.5 - 5.0 g/dL   AST 49 (H) 15 - 41 U/L   ALT 77 (H) 0 - 44 U/L   Alkaline Phosphatase  107 38 - 126 U/L   Total Bilirubin 0.3 0.0 - 1.2 mg/dL   GFR, Estimated >39 >39 mL/min   Anion gap 12 5 - 15  Lactic acid, plasma   Collection Time: 06/11/24  1:53  PM  Result Value Ref Range   Lactic Acid, Venous 0.8 0.5 - 1.9 mmol/L          Assessment & Plan:   Problem List Items Addressed This Visit       Cardiovascular and Mediastinum   Migraine with aura and without status migrainosus, not intractable   Relevant Medications   butalbital -acetaminophen-caffeine  (FIORICET) 50-325-40 MG tablet   losartan  (COZAAR ) 50 MG tablet   amLODipine  (NORVASC ) 5 MG tablet   escitalopram  (LEXAPRO ) 20 MG tablet   minoxidil  (LONITEN ) 2.5 MG tablet   Essential hypertension - Primary   Relevant Medications   losartan  (COZAAR ) 50 MG tablet   amLODipine  (NORVASC ) 5 MG tablet   minoxidil  (LONITEN ) 2.5 MG tablet   Other Relevant Orders   CBC with Differential/Platelet   Comprehensive metabolic panel with GFR     Other   Morbid obesity (HCC)   Relevant Orders   TSH   Prediabetes   Relevant Orders   Comprehensive metabolic panel with GFR   Hemoglobin A1c   Anxiety   Relevant Medications   busPIRone  (BUSPAR ) 5 MG tablet   escitalopram  (LEXAPRO ) 20 MG tablet   Mild episode of recurrent major depressive disorder   Relevant Medications   busPIRone  (BUSPAR ) 5 MG tablet   escitalopram  (LEXAPRO ) 20 MG tablet   Hyperlipidemia   Relevant Medications   losartan  (COZAAR ) 50 MG tablet   amLODipine  (NORVASC ) 5 MG tablet   minoxidil  (LONITEN ) 2.5 MG tablet   Other Relevant Orders   Lipid panel   Other Visit Diagnoses       Encounter for screening mammogram for malignant neoplasm of breast       Relevant Orders   MM 3D SCREENING MAMMOGRAM BILATERAL BREAST     Female pattern hair loss       Relevant Medications   minoxidil  (LONITEN ) 2.5 MG tablet        Assessment and Plan Assessment & Plan Essential hypertension Blood pressure remains elevated despite current medication regimen.  Recent readings include 141/84 at urgent care and higher at ER. Current medications include amlodipine  5 mg and losartan  25 mg. Amlodipine  dose increase not preferred due to risk of leg swelling. Losartan  dose increase considered to better control blood pressure. - Increased losartan  to 50 mg daily. - Instructed to take two 25 mg tablets until new prescription is filled. - Encouraged consistent medication adherence. - Rechecked blood pressure before leaving the clinic. - Advised to have blood pressure checked at urgent care next week.  Migraine with aura Increased frequency of migraines, likely due to recent stress. Current medication includes Fioricet as needed. - Refilled Fioricet prescription.  Major depressive disorder, recurrent Depression screening remains positive. Stress related to financial issues may be contributing to symptoms. Currently taking Lexapro , but adherence is uncertain. - Continue Lexapro  as prescribed.  Anxiety disorder Continues to take buspirone  as needed. Uncertain if it is effective. - Continue buspirone  as needed.  Hyperlipidemia Recent lipid panel showed triglycerides at 164 mg/dL.  Morbid obesity Current weight is 351 lbs with a BMI of 47.60.  Prediabetes Last A1c was 6.1% in February 2025.  Elevated liver enzymes Previous liver enzymes were elevated with ALT at 77. Possible causes include fatty liver, alcohol, excessive Tylenol, or hepatitis. No significant alcohol use reported. Tylenol use is minimal, primarily ibuprofen. - Rechecked liver enzymes.  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 Also using Nutrafol, but unsure of its effectiveness. - Refilled minoxidil  prescription.  General health maintenance Due for mammogram and Pap smear. Mammogram order placed. Pap smear last done in 2021. - Scheduled Pap smear. - Ordered mammogram.        Follow up plan: Return in about 6 months (around 01/17/2025) for cpe. "

## 2024-07-21 ENCOUNTER — Ambulatory Visit: Payer: Self-pay | Admitting: Nurse Practitioner

## 2024-07-21 LAB — COMPREHENSIVE METABOLIC PANEL WITH GFR
AG Ratio: 1.9 (calc) (ref 1.0–2.5)
ALT: 53 U/L — ABNORMAL HIGH (ref 6–29)
AST: 30 U/L (ref 10–35)
Albumin: 4.4 g/dL (ref 3.6–5.1)
Alkaline phosphatase (APISO): 90 U/L (ref 31–125)
BUN: 13 mg/dL (ref 7–25)
CO2: 26 mmol/L (ref 20–32)
Calcium: 9.8 mg/dL (ref 8.6–10.2)
Chloride: 104 mmol/L (ref 98–110)
Creat: 0.81 mg/dL (ref 0.50–0.99)
Globulin: 2.3 g/dL (ref 1.9–3.7)
Glucose, Bld: 150 mg/dL — ABNORMAL HIGH (ref 65–99)
Potassium: 4.3 mmol/L (ref 3.5–5.3)
Sodium: 137 mmol/L (ref 135–146)
Total Bilirubin: 0.3 mg/dL (ref 0.2–1.2)
Total Protein: 6.7 g/dL (ref 6.1–8.1)
eGFR: 91 mL/min/1.73m2

## 2024-07-21 LAB — CBC WITH DIFFERENTIAL/PLATELET
Absolute Lymphocytes: 2888 {cells}/uL (ref 850–3900)
Absolute Monocytes: 448 {cells}/uL (ref 200–950)
Basophils Absolute: 66 {cells}/uL (ref 0–200)
Basophils Relative: 0.8 %
Eosinophils Absolute: 158 {cells}/uL (ref 15–500)
Eosinophils Relative: 1.9 %
HCT: 40.6 % (ref 35.9–46.0)
Hemoglobin: 13.3 g/dL (ref 11.7–15.5)
MCH: 29.1 pg (ref 27.0–33.0)
MCHC: 32.8 g/dL (ref 31.6–35.4)
MCV: 88.8 fL (ref 81.4–101.7)
MPV: 10.3 fL (ref 7.5–12.5)
Monocytes Relative: 5.4 %
Neutro Abs: 4739 {cells}/uL (ref 1500–7800)
Neutrophils Relative %: 57.1 %
Platelets: 339 Thousand/uL (ref 140–400)
RBC: 4.57 Million/uL (ref 3.80–5.10)
RDW: 14 % (ref 11.0–15.0)
Total Lymphocyte: 34.8 %
WBC: 8.3 Thousand/uL (ref 3.8–10.8)

## 2024-07-21 LAB — TSH: TSH: 3.12 m[IU]/L

## 2024-07-21 LAB — LIPID PANEL
Cholesterol: 185 mg/dL
HDL: 58 mg/dL
LDL Cholesterol (Calc): 105 mg/dL — ABNORMAL HIGH
Non-HDL Cholesterol (Calc): 127 mg/dL
Total CHOL/HDL Ratio: 3.2 (calc)
Triglycerides: 128 mg/dL

## 2024-07-21 LAB — HEMOGLOBIN A1C
Hgb A1c MFr Bld: 6.1 % — ABNORMAL HIGH
Mean Plasma Glucose: 128 mg/dL
eAG (mmol/L): 7.1 mmol/L

## 2024-07-27 ENCOUNTER — Ambulatory Visit

## 2024-08-02 ENCOUNTER — Ambulatory Visit

## 2024-08-08 ENCOUNTER — Ambulatory Visit

## 2024-08-08 VITALS — BP 134/78

## 2024-08-08 DIAGNOSIS — I10 Essential (primary) hypertension: Secondary | ICD-10-CM

## 2024-08-08 DIAGNOSIS — Z0130 Encounter for examination of blood pressure without abnormal findings: Secondary | ICD-10-CM

## 2024-08-08 NOTE — Progress Notes (Signed)
 Patient is in office today for a nurse visit for Blood Pressure Check. Patient blood pressure was 134/78, Patient No chest pain, No shortness of breath, No dyspnea on exertion, No orthopnea   Essential hypertension Blood pressure remains elevated despite current medication regimen. Recent readings include 141/84 at urgent care and higher at ER. Current medications include amlodipine  5 mg and losartan  25 mg. Amlodipine  dose increase not preferred due to risk of leg swelling. Losartan  dose increase considered to better control blood pressure. - Increased losartan  to 50 mg daily. - Instructed to take two 25 mg tablets until new prescription is filled. - Encouraged consistent medication adherence. - Rechecked blood pressure before leaving the clinic. - Advised to have blood pressure checked at urgent care next week.

## 2024-08-11 ENCOUNTER — Other Ambulatory Visit: Payer: Self-pay | Admitting: Nurse Practitioner

## 2024-08-11 DIAGNOSIS — F419 Anxiety disorder, unspecified: Secondary | ICD-10-CM

## 2024-08-15 ENCOUNTER — Encounter

## 2025-01-17 ENCOUNTER — Ambulatory Visit: Admitting: Nurse Practitioner
